# Patient Record
Sex: Female | Born: 1955 | Race: White | Hispanic: No | Smoking: Former smoker
Health system: Southern US, Community
[De-identification: ages and names within clinical notes are randomized; demographics above are authoritative.]

## PROBLEM LIST (undated history)

## (undated) DIAGNOSIS — Z8679 Personal history of other diseases of the circulatory system: Secondary | ICD-10-CM

## (undated) DIAGNOSIS — N159 Renal tubulo-interstitial disease, unspecified: Secondary | ICD-10-CM

## (undated) DIAGNOSIS — K589 Irritable bowel syndrome without diarrhea: Secondary | ICD-10-CM

## (undated) DIAGNOSIS — E785 Hyperlipidemia, unspecified: Secondary | ICD-10-CM

## (undated) DIAGNOSIS — F419 Anxiety disorder, unspecified: Secondary | ICD-10-CM

## (undated) DIAGNOSIS — F988 Other specified behavioral and emotional disorders with onset usually occurring in childhood and adolescence: Secondary | ICD-10-CM

## (undated) DIAGNOSIS — R6882 Decreased libido: Secondary | ICD-10-CM

## (undated) DIAGNOSIS — F32A Depression, unspecified: Secondary | ICD-10-CM

## (undated) DIAGNOSIS — M8580 Other specified disorders of bone density and structure, unspecified site: Secondary | ICD-10-CM

## (undated) DIAGNOSIS — M1990 Unspecified osteoarthritis, unspecified site: Secondary | ICD-10-CM

## (undated) DIAGNOSIS — D329 Benign neoplasm of meninges, unspecified: Secondary | ICD-10-CM

## (undated) DIAGNOSIS — M25569 Pain in unspecified knee: Secondary | ICD-10-CM

## (undated) DIAGNOSIS — E039 Hypothyroidism, unspecified: Secondary | ICD-10-CM

## (undated) DIAGNOSIS — N941 Unspecified dyspareunia: Secondary | ICD-10-CM

## (undated) DIAGNOSIS — Z87440 Personal history of urinary (tract) infections: Secondary | ICD-10-CM

## (undated) DIAGNOSIS — N952 Postmenopausal atrophic vaginitis: Secondary | ICD-10-CM

## (undated) DIAGNOSIS — Z872 Personal history of diseases of the skin and subcutaneous tissue: Secondary | ICD-10-CM

## (undated) DIAGNOSIS — T8859XA Other complications of anesthesia, initial encounter: Secondary | ICD-10-CM

## (undated) DIAGNOSIS — N951 Menopausal and female climacteric states: Secondary | ICD-10-CM

## (undated) DIAGNOSIS — F329 Major depressive disorder, single episode, unspecified: Secondary | ICD-10-CM

## (undated) DIAGNOSIS — R3915 Urgency of urination: Secondary | ICD-10-CM

## (undated) DIAGNOSIS — T7840XA Allergy, unspecified, initial encounter: Secondary | ICD-10-CM

## (undated) DIAGNOSIS — N920 Excessive and frequent menstruation with regular cycle: Secondary | ICD-10-CM

## (undated) DIAGNOSIS — T4145XA Adverse effect of unspecified anesthetic, initial encounter: Secondary | ICD-10-CM

## (undated) DIAGNOSIS — Z9049 Acquired absence of other specified parts of digestive tract: Secondary | ICD-10-CM

## (undated) DIAGNOSIS — N939 Abnormal uterine and vaginal bleeding, unspecified: Secondary | ICD-10-CM

## (undated) DIAGNOSIS — N950 Postmenopausal bleeding: Secondary | ICD-10-CM

## (undated) DIAGNOSIS — N898 Other specified noninflammatory disorders of vagina: Secondary | ICD-10-CM

## (undated) DIAGNOSIS — N200 Calculus of kidney: Secondary | ICD-10-CM

## (undated) HISTORY — DX: Depression, unspecified: F32.A

## (undated) HISTORY — DX: Major depressive disorder, single episode, unspecified: F32.9

## (undated) HISTORY — DX: Other specified behavioral and emotional disorders with onset usually occurring in childhood and adolescence: F98.8

## (undated) HISTORY — DX: Personal history of diseases of the skin and subcutaneous tissue: Z87.2

## (undated) HISTORY — PX: WISDOM TOOTH EXTRACTION: SHX21

## (undated) HISTORY — DX: Excessive and frequent menstruation with regular cycle: N92.0

## (undated) HISTORY — DX: Allergy, unspecified, initial encounter: T78.40XA

## (undated) HISTORY — DX: Decreased libido: R68.82

## (undated) HISTORY — DX: Irritable bowel syndrome, unspecified: K58.9

## (undated) HISTORY — DX: Pain in unspecified knee: M25.569

## (undated) HISTORY — DX: Calculus of kidney: N20.0

## (undated) HISTORY — DX: Postmenopausal bleeding: N95.0

## (undated) HISTORY — DX: Menopausal and female climacteric states: N95.1

## (undated) HISTORY — DX: Personal history of urinary (tract) infections: Z87.440

## (undated) HISTORY — DX: Other specified disorders of bone density and structure, unspecified site: M85.80

## (undated) HISTORY — DX: Abnormal uterine and vaginal bleeding, unspecified: N93.9

## (undated) HISTORY — DX: Urgency of urination: R39.15

## (undated) HISTORY — DX: Other complications of anesthesia, initial encounter: T88.59XA

## (undated) HISTORY — DX: Unspecified osteoarthritis, unspecified site: M19.90

## (undated) HISTORY — DX: Postmenopausal atrophic vaginitis: N95.2

## (undated) HISTORY — DX: Unspecified dyspareunia: N94.10

## (undated) HISTORY — PX: KNEE SURGERY: SHX244

## (undated) HISTORY — DX: Hypothyroidism, unspecified: E03.9

## (undated) HISTORY — DX: Adverse effect of unspecified anesthetic, initial encounter: T41.45XA

## (undated) HISTORY — DX: Benign neoplasm of meninges, unspecified: D32.9

## (undated) HISTORY — PX: TONSILLECTOMY: SUR1361

## (undated) HISTORY — DX: Acquired absence of other specified parts of digestive tract: Z90.49

## (undated) HISTORY — PX: BOWEL RESECTION: SHX1257

## (undated) HISTORY — DX: Renal tubulo-interstitial disease, unspecified: N15.9

## (undated) HISTORY — DX: Hyperlipidemia, unspecified: E78.5

## (undated) HISTORY — DX: Personal history of other diseases of the circulatory system: Z86.79

## (undated) HISTORY — DX: Anxiety disorder, unspecified: F41.9

## (undated) HISTORY — DX: Other specified noninflammatory disorders of vagina: N89.8

## (undated) HISTORY — PX: ANTERIOR CRUCIATE LIGAMENT REPAIR: SHX115

---

## 2001-06-30 ENCOUNTER — Other Ambulatory Visit: Admission: RE | Admit: 2001-06-30 | Discharge: 2001-06-30 | Payer: Self-pay | Admitting: Family Medicine

## 2001-11-10 ENCOUNTER — Encounter: Payer: Self-pay | Admitting: Family Medicine

## 2001-11-10 ENCOUNTER — Encounter: Admission: RE | Admit: 2001-11-10 | Discharge: 2001-11-10 | Payer: Self-pay | Admitting: Family Medicine

## 2001-12-02 ENCOUNTER — Ambulatory Visit (HOSPITAL_COMMUNITY): Admission: RE | Admit: 2001-12-02 | Discharge: 2001-12-02 | Payer: Self-pay | Admitting: Surgery

## 2003-02-18 ENCOUNTER — Other Ambulatory Visit: Admission: RE | Admit: 2003-02-18 | Discharge: 2003-02-18 | Payer: Self-pay | Admitting: Family Medicine

## 2004-04-14 ENCOUNTER — Other Ambulatory Visit: Admission: RE | Admit: 2004-04-14 | Discharge: 2004-04-14 | Payer: Self-pay | Admitting: Family Medicine

## 2004-07-07 ENCOUNTER — Emergency Department (HOSPITAL_COMMUNITY): Admission: EM | Admit: 2004-07-07 | Discharge: 2004-07-07 | Payer: Self-pay | Admitting: Emergency Medicine

## 2004-07-30 DIAGNOSIS — R6882 Decreased libido: Secondary | ICD-10-CM

## 2004-07-30 DIAGNOSIS — N939 Abnormal uterine and vaginal bleeding, unspecified: Secondary | ICD-10-CM

## 2004-07-30 DIAGNOSIS — N951 Menopausal and female climacteric states: Secondary | ICD-10-CM

## 2004-07-30 DIAGNOSIS — N950 Postmenopausal bleeding: Secondary | ICD-10-CM

## 2004-07-30 DIAGNOSIS — N200 Calculus of kidney: Secondary | ICD-10-CM

## 2004-07-30 DIAGNOSIS — N941 Unspecified dyspareunia: Secondary | ICD-10-CM

## 2004-07-30 DIAGNOSIS — N920 Excessive and frequent menstruation with regular cycle: Secondary | ICD-10-CM

## 2004-07-30 HISTORY — DX: Decreased libido: R68.82

## 2004-07-30 HISTORY — DX: Postmenopausal bleeding: N95.0

## 2004-07-30 HISTORY — DX: Unspecified dyspareunia: N94.10

## 2004-07-30 HISTORY — DX: Calculus of kidney: N20.0

## 2004-07-30 HISTORY — DX: Abnormal uterine and vaginal bleeding, unspecified: N93.9

## 2004-07-30 HISTORY — DX: Excessive and frequent menstruation with regular cycle: N92.0

## 2004-07-30 HISTORY — DX: Menopausal and female climacteric states: N95.1

## 2004-08-24 ENCOUNTER — Ambulatory Visit: Payer: Self-pay | Admitting: Family Medicine

## 2005-02-15 ENCOUNTER — Ambulatory Visit: Payer: Self-pay | Admitting: Internal Medicine

## 2005-03-06 ENCOUNTER — Ambulatory Visit: Payer: Self-pay

## 2005-03-19 ENCOUNTER — Ambulatory Visit: Payer: Self-pay | Admitting: Internal Medicine

## 2005-06-18 ENCOUNTER — Ambulatory Visit: Payer: Self-pay | Admitting: Internal Medicine

## 2005-07-30 DIAGNOSIS — N952 Postmenopausal atrophic vaginitis: Secondary | ICD-10-CM

## 2005-07-30 DIAGNOSIS — N898 Other specified noninflammatory disorders of vagina: Secondary | ICD-10-CM

## 2005-07-30 HISTORY — DX: Other specified noninflammatory disorders of vagina: N89.8

## 2005-07-30 HISTORY — DX: Postmenopausal atrophic vaginitis: N95.2

## 2005-09-10 ENCOUNTER — Ambulatory Visit: Payer: Self-pay | Admitting: Internal Medicine

## 2005-11-13 ENCOUNTER — Ambulatory Visit: Payer: Self-pay | Admitting: Internal Medicine

## 2005-11-22 ENCOUNTER — Ambulatory Visit: Payer: Self-pay | Admitting: Internal Medicine

## 2005-12-17 ENCOUNTER — Ambulatory Visit: Payer: Self-pay | Admitting: Internal Medicine

## 2006-01-30 ENCOUNTER — Emergency Department (HOSPITAL_COMMUNITY): Admission: EM | Admit: 2006-01-30 | Discharge: 2006-01-30 | Payer: Self-pay | Admitting: Emergency Medicine

## 2006-03-19 ENCOUNTER — Ambulatory Visit: Payer: Self-pay | Admitting: Internal Medicine

## 2006-04-24 ENCOUNTER — Other Ambulatory Visit: Admission: RE | Admit: 2006-04-24 | Discharge: 2006-04-24 | Payer: Self-pay | Admitting: Obstetrics and Gynecology

## 2006-07-10 ENCOUNTER — Ambulatory Visit: Payer: Self-pay | Admitting: Internal Medicine

## 2006-07-10 LAB — CONVERTED CEMR LAB
ALT: 18 units/L (ref 0–40)
AST: 26 units/L (ref 0–37)
Albumin: 3.9 g/dL (ref 3.5–5.2)
BUN: 13 mg/dL (ref 6–23)
Basophils Relative: 0.5 % (ref 0.0–1.0)
Bilirubin Urine: NEGATIVE
Creatinine, Ser: 0.7 mg/dL (ref 0.4–1.2)
Eosinophil percent: 2.8 % (ref 0.0–5.0)
Free T4: 0.9 ng/dL (ref 0.9–1.8)
GFR calc non Af Amer: 94 mL/min
HCT: 38 % (ref 36.0–46.0)
HDL: 90.5 mg/dL (ref >39.0)
Hemoglobin: 12.7 g/dL (ref 12.0–15.0)
Hgb A1c MFr Bld: 5.8 % (ref 4.6–6.0)
LDL DIRECT: 224.4 mg/dL
Leukocytes, UA: NEGATIVE
MCHC: 33.4 g/dL (ref 30.0–36.0)
Neutro Abs: 1.8 10*3/uL (ref 1.4–7.7)
Neutrophils Relative %: 37.2 % — ABNORMAL LOW (ref 43.0–77.0)
Potassium: 4.1 meq/L (ref 3.5–5.1)
RBC: 4.2 M/uL (ref 3.87–5.11)
RDW: 13.9 % (ref 11.5–14.6)
Sodium: 139 meq/L (ref 135–145)
Total Bilirubin: 1.1 mg/dL (ref 0.3–1.2)
Triglyceride fasting, serum: 58 mg/dL (ref 0–149)
Urine Glucose: NEGATIVE mg/dL
WBC: 4.9 10*3/uL (ref 4.5–10.5)

## 2006-07-17 ENCOUNTER — Ambulatory Visit: Payer: Self-pay | Admitting: Internal Medicine

## 2006-10-28 ENCOUNTER — Ambulatory Visit: Payer: Self-pay | Admitting: Internal Medicine

## 2006-10-28 LAB — CONVERTED CEMR LAB
Cholesterol: 298 mg/dL (ref 0–200)
HDL: 80.9 mg/dL (ref >39.0)
VLDL: 14 mg/dL (ref 0–40)

## 2006-10-30 ENCOUNTER — Ambulatory Visit: Payer: Self-pay | Admitting: Internal Medicine

## 2006-11-26 ENCOUNTER — Ambulatory Visit: Payer: Self-pay | Admitting: Internal Medicine

## 2006-12-27 ENCOUNTER — Ambulatory Visit: Payer: Self-pay | Admitting: Internal Medicine

## 2007-02-07 ENCOUNTER — Ambulatory Visit: Payer: Self-pay | Admitting: Internal Medicine

## 2007-02-27 DIAGNOSIS — K589 Irritable bowel syndrome without diarrhea: Secondary | ICD-10-CM | POA: Insufficient documentation

## 2007-02-27 DIAGNOSIS — E559 Vitamin D deficiency, unspecified: Secondary | ICD-10-CM | POA: Insufficient documentation

## 2007-02-27 DIAGNOSIS — E785 Hyperlipidemia, unspecified: Secondary | ICD-10-CM | POA: Insufficient documentation

## 2007-04-10 ENCOUNTER — Ambulatory Visit: Payer: Self-pay | Admitting: Internal Medicine

## 2007-04-10 LAB — CONVERTED CEMR LAB
ALT: 20 units/L (ref 0–35)
AST: 20 units/L (ref 0–37)
Alkaline Phosphatase: 37 units/L — ABNORMAL LOW (ref 39–117)
BUN: 11 mg/dL (ref 6–23)
Basophils Relative: 0.4 % (ref 0.0–1.0)
CO2: 29 meq/L (ref 19–32)
Calcium: 9.5 mg/dL (ref 8.4–10.5)
Chloride: 106 meq/L (ref 96–112)
Cholesterol: 279 mg/dL (ref 0–200)
Eosinophils Relative: 1.7 % (ref 0.0–5.0)
Free T4: 0.8 ng/dL (ref 0.6–1.6)
GFR calc Af Amer: 85 mL/min
GFR calc non Af Amer: 70 mL/min
HDL: 90.2 mg/dL (ref >39.0)
Lymphocytes Relative: 45.8 % (ref 12.0–46.0)
Monocytes Relative: 7.9 % (ref 3.0–11.0)
Neutro Abs: 2.7 10*3/uL (ref 1.4–7.7)
Platelets: 325 10*3/uL (ref 150–400)
RBC: 4.47 M/uL (ref 3.87–5.11)
T3 Uptake Ratio: 43.7 % — ABNORMAL HIGH (ref 22.5–37.0)
TSH: 1.45 microintl units/mL (ref 0.35–5.50)
Total CHOL/HDL Ratio: 3.1
Total Protein: 6.7 g/dL (ref 6.0–8.3)
Triglycerides: 49 mg/dL (ref 0–149)
VLDL: 10 mg/dL (ref 0–40)
Vit D, 1,25-Dihydroxy: 23 (ref 20–57)
WBC: 6.1 10*3/uL (ref 4.5–10.5)

## 2007-04-11 ENCOUNTER — Ambulatory Visit: Payer: Self-pay | Admitting: Internal Medicine

## 2007-04-24 ENCOUNTER — Ambulatory Visit: Payer: Self-pay | Admitting: Internal Medicine

## 2007-04-30 ENCOUNTER — Ambulatory Visit: Payer: Self-pay | Admitting: Internal Medicine

## 2007-05-29 ENCOUNTER — Telehealth: Payer: Self-pay | Admitting: Internal Medicine

## 2007-06-24 ENCOUNTER — Telehealth: Payer: Self-pay | Admitting: Internal Medicine

## 2007-06-27 ENCOUNTER — Emergency Department (HOSPITAL_COMMUNITY): Admission: EM | Admit: 2007-06-27 | Discharge: 2007-06-27 | Payer: Self-pay | Admitting: Emergency Medicine

## 2007-07-22 ENCOUNTER — Ambulatory Visit: Payer: Self-pay | Admitting: Internal Medicine

## 2007-07-22 DIAGNOSIS — R42 Dizziness and giddiness: Secondary | ICD-10-CM | POA: Insufficient documentation

## 2007-07-22 DIAGNOSIS — M949 Disorder of cartilage, unspecified: Secondary | ICD-10-CM

## 2007-07-22 DIAGNOSIS — E039 Hypothyroidism, unspecified: Secondary | ICD-10-CM | POA: Insufficient documentation

## 2007-07-22 DIAGNOSIS — M1990 Unspecified osteoarthritis, unspecified site: Secondary | ICD-10-CM | POA: Insufficient documentation

## 2007-07-22 DIAGNOSIS — M899 Disorder of bone, unspecified: Secondary | ICD-10-CM | POA: Insufficient documentation

## 2007-07-31 DIAGNOSIS — R3915 Urgency of urination: Secondary | ICD-10-CM

## 2007-07-31 DIAGNOSIS — Z87440 Personal history of urinary (tract) infections: Secondary | ICD-10-CM

## 2007-07-31 HISTORY — DX: Urgency of urination: R39.15

## 2007-07-31 HISTORY — DX: Personal history of urinary (tract) infections: Z87.440

## 2007-10-21 ENCOUNTER — Encounter: Payer: Self-pay | Admitting: Internal Medicine

## 2007-11-17 ENCOUNTER — Ambulatory Visit: Payer: Self-pay | Admitting: Internal Medicine

## 2007-11-17 ENCOUNTER — Encounter (INDEPENDENT_AMBULATORY_CARE_PROVIDER_SITE_OTHER): Payer: Self-pay | Admitting: *Deleted

## 2007-11-17 DIAGNOSIS — B079 Viral wart, unspecified: Secondary | ICD-10-CM | POA: Insufficient documentation

## 2007-11-17 DIAGNOSIS — F988 Other specified behavioral and emotional disorders with onset usually occurring in childhood and adolescence: Secondary | ICD-10-CM | POA: Insufficient documentation

## 2007-11-17 DIAGNOSIS — M546 Pain in thoracic spine: Secondary | ICD-10-CM | POA: Insufficient documentation

## 2007-12-03 ENCOUNTER — Ambulatory Visit: Payer: Self-pay | Admitting: Internal Medicine

## 2007-12-03 DIAGNOSIS — H5450 Low vision, one eye, unspecified eye: Secondary | ICD-10-CM | POA: Insufficient documentation

## 2007-12-05 ENCOUNTER — Ambulatory Visit: Payer: Self-pay

## 2007-12-05 ENCOUNTER — Encounter: Payer: Self-pay | Admitting: Internal Medicine

## 2007-12-19 ENCOUNTER — Encounter: Payer: Self-pay | Admitting: Internal Medicine

## 2007-12-30 ENCOUNTER — Telehealth: Payer: Self-pay | Admitting: Internal Medicine

## 2008-01-05 ENCOUNTER — Telehealth: Payer: Self-pay | Admitting: Internal Medicine

## 2008-01-16 ENCOUNTER — Ambulatory Visit: Payer: Self-pay | Admitting: Internal Medicine

## 2008-01-22 ENCOUNTER — Telehealth (INDEPENDENT_AMBULATORY_CARE_PROVIDER_SITE_OTHER): Payer: Self-pay | Admitting: *Deleted

## 2008-01-22 ENCOUNTER — Telehealth: Payer: Self-pay | Admitting: Internal Medicine

## 2008-02-02 ENCOUNTER — Telehealth: Payer: Self-pay | Admitting: Internal Medicine

## 2008-02-03 ENCOUNTER — Encounter: Payer: Self-pay | Admitting: Internal Medicine

## 2008-02-06 ENCOUNTER — Encounter: Payer: Self-pay | Admitting: Internal Medicine

## 2008-02-06 LAB — CONVERTED CEMR LAB
Alkaline Phosphatase: 38 units/L — ABNORMAL LOW (ref 39–117)
Basophils Absolute: 0.1 10*3/uL (ref 0.0–0.1)
Bilirubin Urine: NEGATIVE
Bilirubin, Direct: 0.1 mg/dL (ref 0.0–0.3)
CO2: 31 meq/L (ref 19–32)
Calcium: 9.4 mg/dL (ref 8.4–10.5)
Cholesterol: 265 mg/dL (ref 0–200)
Crystals: NEGATIVE
Free T4: 1 ng/dL (ref 0.6–1.6)
GFR calc Af Amer: 97 mL/min
Glucose, Bld: 91 mg/dL (ref 70–99)
Hemoglobin, Urine: NEGATIVE
Lymphocytes Relative: 31.1 % (ref 12.0–46.0)
MCHC: 33.4 g/dL (ref 30.0–36.0)
Monocytes Relative: 7 % (ref 3.0–12.0)
Nitrite: NEGATIVE
Platelets: 365 10*3/uL (ref 150–400)
Potassium: 4 meq/L (ref 3.5–5.1)
RDW: 13.9 % (ref 11.5–14.6)
Sodium: 141 meq/L (ref 135–145)
TSH: 1.02 microintl units/mL (ref 0.35–5.50)
Total Bilirubin: 0.9 mg/dL (ref 0.3–1.2)
Total Protein: 5.9 g/dL — ABNORMAL LOW (ref 6.0–8.3)
Urobilinogen, UA: 0.2 (ref 0.0–1.0)

## 2008-02-17 ENCOUNTER — Ambulatory Visit (HOSPITAL_COMMUNITY): Admission: RE | Admit: 2008-02-17 | Discharge: 2008-02-17 | Payer: Self-pay | Admitting: Neurology

## 2008-02-17 ENCOUNTER — Encounter: Payer: Self-pay | Admitting: Internal Medicine

## 2008-02-19 ENCOUNTER — Telehealth: Payer: Self-pay | Admitting: Internal Medicine

## 2008-02-20 ENCOUNTER — Encounter: Admission: RE | Admit: 2008-02-20 | Discharge: 2008-02-20 | Payer: Self-pay | Admitting: Neurology

## 2008-02-25 ENCOUNTER — Ambulatory Visit: Payer: Self-pay | Admitting: Internal Medicine

## 2008-02-25 DIAGNOSIS — E249 Cushing's syndrome, unspecified: Secondary | ICD-10-CM | POA: Insufficient documentation

## 2008-02-25 DIAGNOSIS — H469 Unspecified optic neuritis: Secondary | ICD-10-CM | POA: Insufficient documentation

## 2008-03-02 ENCOUNTER — Encounter: Payer: Self-pay | Admitting: Internal Medicine

## 2008-03-10 ENCOUNTER — Encounter: Payer: Self-pay | Admitting: Internal Medicine

## 2008-03-25 ENCOUNTER — Encounter: Payer: Self-pay | Admitting: Internal Medicine

## 2008-04-19 ENCOUNTER — Ambulatory Visit: Payer: Self-pay | Admitting: Internal Medicine

## 2008-04-19 LAB — CONVERTED CEMR LAB
ALT: 21 units/L (ref 0–35)
AST: 22 units/L (ref 0–37)
BUN: 20 mg/dL (ref 6–23)
Basophils Relative: 0.3 % (ref 0.0–3.0)
CO2: 32 meq/L (ref 19–32)
Calcium: 9.7 mg/dL (ref 8.4–10.5)
Chloride: 106 meq/L (ref 96–112)
Creatinine, Ser: 0.9 mg/dL (ref 0.4–1.2)
Eosinophils Relative: 1.5 % (ref 0.0–5.0)
Glucose, Bld: 97 mg/dL (ref 70–99)
Hemoglobin: 14.5 g/dL (ref 12.0–15.0)
Lymphocytes Relative: 44.3 % (ref 12.0–46.0)
MCHC: 34 g/dL (ref 30.0–36.0)
Monocytes Relative: 5.7 % (ref 3.0–12.0)
Neutro Abs: 3.5 10*3/uL (ref 1.4–7.7)
Neutrophils Relative %: 48.2 % (ref 43.0–77.0)
RBC: 4.54 M/uL (ref 3.87–5.11)
TSH: 2.36 microintl units/mL (ref 0.35–5.50)
Total Bilirubin: 0.9 mg/dL (ref 0.3–1.2)
Total Protein: 6.7 g/dL (ref 6.0–8.3)
Vitamin B-12: 1260 pg/mL — ABNORMAL HIGH (ref 211–911)
WBC: 7.1 10*3/uL (ref 4.5–10.5)

## 2008-04-20 ENCOUNTER — Encounter: Payer: Self-pay | Admitting: Internal Medicine

## 2008-04-30 ENCOUNTER — Encounter: Payer: Self-pay | Admitting: Internal Medicine

## 2008-05-06 ENCOUNTER — Encounter: Payer: Self-pay | Admitting: Internal Medicine

## 2008-05-17 ENCOUNTER — Telehealth: Payer: Self-pay | Admitting: Internal Medicine

## 2008-05-17 ENCOUNTER — Encounter: Payer: Self-pay | Admitting: Internal Medicine

## 2008-06-15 ENCOUNTER — Encounter: Payer: Self-pay | Admitting: Internal Medicine

## 2008-06-21 ENCOUNTER — Encounter: Payer: Self-pay | Admitting: Internal Medicine

## 2008-06-21 ENCOUNTER — Encounter: Admission: RE | Admit: 2008-06-21 | Discharge: 2008-06-21 | Payer: Self-pay | Admitting: Neurology

## 2008-07-19 ENCOUNTER — Ambulatory Visit: Payer: Self-pay | Admitting: Internal Medicine

## 2008-07-30 DIAGNOSIS — Z872 Personal history of diseases of the skin and subcutaneous tissue: Secondary | ICD-10-CM

## 2008-07-30 HISTORY — DX: Personal history of diseases of the skin and subcutaneous tissue: Z87.2

## 2008-08-04 ENCOUNTER — Telehealth: Payer: Self-pay | Admitting: Internal Medicine

## 2008-09-22 ENCOUNTER — Ambulatory Visit: Payer: Self-pay | Admitting: Internal Medicine

## 2008-10-25 ENCOUNTER — Encounter: Payer: Self-pay | Admitting: Internal Medicine

## 2008-12-15 ENCOUNTER — Ambulatory Visit (HOSPITAL_COMMUNITY): Admission: RE | Admit: 2008-12-15 | Discharge: 2008-12-15 | Payer: Self-pay | Admitting: Neurology

## 2008-12-17 ENCOUNTER — Encounter: Admission: RE | Admit: 2008-12-17 | Discharge: 2008-12-17 | Payer: Self-pay | Admitting: Neurology

## 2008-12-29 ENCOUNTER — Ambulatory Visit: Payer: Self-pay | Admitting: Internal Medicine

## 2008-12-29 DIAGNOSIS — M542 Cervicalgia: Secondary | ICD-10-CM | POA: Insufficient documentation

## 2008-12-29 LAB — CONVERTED CEMR LAB: Vit D, 25-Hydroxy: 44 ng/mL (ref 30–89)

## 2008-12-31 ENCOUNTER — Encounter: Payer: Self-pay | Admitting: Internal Medicine

## 2008-12-31 LAB — CONVERTED CEMR LAB
Albumin: 4.2 g/dL (ref 3.5–5.2)
BUN: 15 mg/dL (ref 6–23)
Basophils Relative: 0.7 % (ref 0.0–3.0)
Calcium: 9.7 mg/dL (ref 8.4–10.5)
Cortisol, Plasma: 6 ug/dL
Eosinophils Absolute: 0.1 10*3/uL (ref 0.0–0.7)
Eosinophils Relative: 2.6 % (ref 0.0–5.0)
GFR calc non Af Amer: 79.87 mL/min (ref >60)
Glucose, Bld: 93 mg/dL (ref 70–99)
HCT: 42.1 % (ref 36.0–46.0)
Lymphs Abs: 2.3 10*3/uL (ref 0.7–4.0)
MCHC: 34 g/dL (ref 30.0–36.0)
MCV: 91.8 fL (ref 78.0–100.0)
Monocytes Absolute: 0.5 10*3/uL (ref 0.1–1.0)
Platelets: 301 10*3/uL (ref 150.0–400.0)
Sodium: 143 meq/L (ref 135–145)
Total Bilirubin: 1 mg/dL (ref 0.3–1.2)
Vitamin B-12: 871 pg/mL (ref 211–911)
WBC: 4.5 10*3/uL (ref 4.5–10.5)

## 2009-03-15 ENCOUNTER — Ambulatory Visit: Payer: Self-pay | Admitting: Internal Medicine

## 2009-03-15 ENCOUNTER — Telehealth: Payer: Self-pay | Admitting: Internal Medicine

## 2009-03-16 LAB — CONVERTED CEMR LAB
Bilirubin Urine: NEGATIVE
Hemoglobin, Urine: NEGATIVE
Ketones, ur: NEGATIVE mg/dL
Leukocytes, UA: NEGATIVE
Urobilinogen, UA: 0.2 (ref 0.0–1.0)

## 2009-05-03 ENCOUNTER — Ambulatory Visit: Payer: Self-pay | Admitting: Internal Medicine

## 2009-05-05 LAB — CONVERTED CEMR LAB
AST: 21 units/L (ref 0–37)
BUN: 12 mg/dL (ref 6–23)
Basophils Absolute: 0.1 10*3/uL (ref 0.0–0.1)
Bilirubin Urine: NEGATIVE
Calcium: 9.6 mg/dL (ref 8.4–10.5)
Cholesterol: 291 mg/dL — ABNORMAL HIGH (ref 0–200)
Creatinine, Ser: 0.8 mg/dL (ref 0.4–1.2)
GFR calc non Af Amer: 79.76 mL/min (ref >60)
Glucose, Bld: 97 mg/dL (ref 70–99)
HCT: 42.2 % (ref 36.0–46.0)
Hemoglobin, Urine: NEGATIVE
Lymphs Abs: 2.6 10*3/uL (ref 0.7–4.0)
Monocytes Relative: 8.8 % (ref 3.0–12.0)
Platelets: 302 10*3/uL (ref 150.0–400.0)
RDW: 12.2 % (ref 11.5–14.6)
Total Bilirubin: 0.8 mg/dL (ref 0.3–1.2)
Total CHOL/HDL Ratio: 3
Total Protein, Urine: NEGATIVE mg/dL
Triglycerides: 74 mg/dL (ref 0.0–149.0)
Urine Glucose: NEGATIVE mg/dL
VLDL: 14.8 mg/dL (ref 0.0–40.0)

## 2009-05-06 ENCOUNTER — Telehealth: Payer: Self-pay | Admitting: Internal Medicine

## 2009-05-10 ENCOUNTER — Telehealth (INDEPENDENT_AMBULATORY_CARE_PROVIDER_SITE_OTHER): Payer: Self-pay | Admitting: *Deleted

## 2009-06-03 ENCOUNTER — Telehealth: Payer: Self-pay | Admitting: Internal Medicine

## 2009-06-10 ENCOUNTER — Encounter: Payer: Self-pay | Admitting: Internal Medicine

## 2009-07-06 ENCOUNTER — Ambulatory Visit: Payer: Self-pay | Admitting: Internal Medicine

## 2009-07-06 ENCOUNTER — Encounter: Admission: RE | Admit: 2009-07-06 | Discharge: 2009-07-06 | Payer: Self-pay | Admitting: Neurology

## 2009-07-30 DIAGNOSIS — D329 Benign neoplasm of meninges, unspecified: Secondary | ICD-10-CM

## 2009-07-30 HISTORY — DX: Benign neoplasm of meninges, unspecified: D32.9

## 2009-09-07 ENCOUNTER — Telehealth: Payer: Self-pay | Admitting: Internal Medicine

## 2009-09-29 ENCOUNTER — Telehealth: Payer: Self-pay | Admitting: Internal Medicine

## 2009-09-30 ENCOUNTER — Ambulatory Visit: Payer: Self-pay | Admitting: Gastroenterology

## 2009-09-30 DIAGNOSIS — K219 Gastro-esophageal reflux disease without esophagitis: Secondary | ICD-10-CM | POA: Insufficient documentation

## 2009-10-10 ENCOUNTER — Ambulatory Visit: Payer: Self-pay | Admitting: Gastroenterology

## 2009-10-10 DIAGNOSIS — L089 Local infection of the skin and subcutaneous tissue, unspecified: Secondary | ICD-10-CM | POA: Insufficient documentation

## 2009-11-02 ENCOUNTER — Ambulatory Visit: Payer: Self-pay | Admitting: Internal Medicine

## 2009-11-02 LAB — CONVERTED CEMR LAB
ALT: 23 units/L (ref 0–35)
AST: 19 units/L (ref 0–37)
Albumin: 4.3 g/dL (ref 3.5–5.2)
BUN: 12 mg/dL (ref 6–23)
Basophils Relative: 0.9 % (ref 0.0–3.0)
CO2: 31 meq/L (ref 19–32)
Chloride: 104 meq/L (ref 96–112)
Cholesterol: 326 mg/dL — ABNORMAL HIGH (ref 0–200)
Eosinophils Absolute: 0.2 10*3/uL (ref 0.0–0.7)
Eosinophils Relative: 3 % (ref 0.0–5.0)
Glucose, Bld: 92 mg/dL (ref 70–99)
Hemoglobin: 13.8 g/dL (ref 12.0–15.0)
Lymphocytes Relative: 48.3 % — ABNORMAL HIGH (ref 12.0–46.0)
MCHC: 34.8 g/dL (ref 30.0–36.0)
MCV: 90.5 fL (ref 78.0–100.0)
Monocytes Absolute: 0.5 10*3/uL (ref 0.1–1.0)
Neutro Abs: 2.3 10*3/uL (ref 1.4–7.7)
Potassium: 4.1 meq/L (ref 3.5–5.1)
RBC: 4.38 M/uL (ref 3.87–5.11)

## 2009-11-04 ENCOUNTER — Ambulatory Visit: Payer: Self-pay | Admitting: Internal Medicine

## 2009-11-04 DIAGNOSIS — E669 Obesity, unspecified: Secondary | ICD-10-CM | POA: Insufficient documentation

## 2009-11-07 DIAGNOSIS — F4321 Adjustment disorder with depressed mood: Secondary | ICD-10-CM | POA: Insufficient documentation

## 2009-11-07 DIAGNOSIS — F329 Major depressive disorder, single episode, unspecified: Secondary | ICD-10-CM

## 2009-12-06 ENCOUNTER — Encounter: Payer: Self-pay | Admitting: Internal Medicine

## 2010-03-10 ENCOUNTER — Ambulatory Visit: Payer: Self-pay | Admitting: Internal Medicine

## 2010-03-10 DIAGNOSIS — R011 Cardiac murmur, unspecified: Secondary | ICD-10-CM | POA: Insufficient documentation

## 2010-03-24 ENCOUNTER — Encounter: Payer: Self-pay | Admitting: Internal Medicine

## 2010-04-06 ENCOUNTER — Encounter: Payer: Self-pay | Admitting: Internal Medicine

## 2010-04-13 ENCOUNTER — Telehealth (INDEPENDENT_AMBULATORY_CARE_PROVIDER_SITE_OTHER): Payer: Self-pay | Admitting: *Deleted

## 2010-04-14 ENCOUNTER — Ambulatory Visit: Payer: Self-pay | Admitting: Internal Medicine

## 2010-04-14 DIAGNOSIS — M79609 Pain in unspecified limb: Secondary | ICD-10-CM | POA: Insufficient documentation

## 2010-04-17 ENCOUNTER — Encounter: Payer: Self-pay | Admitting: Internal Medicine

## 2010-06-01 ENCOUNTER — Encounter: Payer: Self-pay | Admitting: Internal Medicine

## 2010-07-14 ENCOUNTER — Ambulatory Visit: Payer: Self-pay | Admitting: Internal Medicine

## 2010-07-14 DIAGNOSIS — D485 Neoplasm of uncertain behavior of skin: Secondary | ICD-10-CM | POA: Insufficient documentation

## 2010-07-14 DIAGNOSIS — F418 Other specified anxiety disorders: Secondary | ICD-10-CM | POA: Insufficient documentation

## 2010-07-17 ENCOUNTER — Ambulatory Visit: Payer: Self-pay | Admitting: Internal Medicine

## 2010-08-16 ENCOUNTER — Ambulatory Visit: Admit: 2010-08-16 | Payer: Self-pay | Admitting: Internal Medicine

## 2010-08-20 ENCOUNTER — Encounter: Payer: Self-pay | Admitting: Neurology

## 2010-08-29 NOTE — Letter (Addendum)
Summary: Neurosurgery Consult/[facility]  Neurosurgery/[facility]   Imported By: Sherian Rein 06/15/2010 10:56:18  _____________________________________________________________________  External Attachment:    Type:   Image     Comment:   External Document

## 2010-08-29 NOTE — Assessment & Plan Note (Signed)
Summary: 4 MO ROV /NWS   Vital Signs:  Patient profile:   55 year old female Weight:      167 pounds BMI:     29.69 Temp:     98.3 degrees F Pulse rate:   60 / minute BP sitting:   132 / 68  (left arm) Cuff size:   regular  Vitals Entered By: Lamar Sprinkles, CMA (March 10, 2010 3:35 PM) CC: Discuss mitral valve prolapse. GYN heard "click" on exam, pt has previous dx of MVP.  Comments Does NOT take Welbutrin, please removed from list   Primary Care Provider:  Sula Soda, MD   CC:  Discuss mitral valve prolapse. GYN heard "click" on exam and pt has previous dx of MVP. Marland Kitchen  History of Present Illness: The patient presents for a follow up of thoracic  back pain, anxiety, depression and and optic neuritis.  On a diet - lost wt F/u chol issue Her GYN told her that she had a heart click  Allergies: 1)  ! Darvocet A500 2)  ! * Latex 3)  Epinephrine 4)  Prometrium (Progesterone Micronized) 5)  Strattera (Atomoxetine Hcl) 6)  Vyvanse (Lisdexamfetamine Dimesylate)  Past History:  Past Medical History: Last updated: 11/04/2009 Hyperlipidemia Osteoarthritis Osteopenia IBS VitD def Hypothyroidism Poss. ADD L knee pain s/p fx Optic neuritis L 2009 Depression  Past Surgical History: Last updated: 11/17/2007 Knee surg L for fx  Family History: Last updated: 09/30/2009 Family History High cholesterol M had a brain tumor No FH of Colon Cancer:  Social History: Last updated: 07/22/2007 Occupation: not working Married Never Smoked  Review of Systems  The patient denies weight gain, chest pain, syncope, dyspnea on exertion, abdominal pain, and melena.    Physical Exam  General:  Less overweight-appearing.  NAD Eyes:  pupils reactive to light, and no injection.  Vision out of L eye is markedly decreased Mouth:  Oral mucosa and oropharynx without lesions or exudates.  Teeth in good repair. Lungs:  Normal respiratory effort, chest expands symmetrically. Lungs are  clear to auscultation, no crackles or wheezes. Heart:  1/6 systolic heart murmur  Abdomen:  Bowel sounds positive,abdomen soft and non-tender without masses, organomegaly or hernias noted. Msk:  No deformity or scoliosis noted of thoracic or lumbar spine.  L knee w/pain. Cervical  and thoracic spine is tender to palpation over paraspinal muscles and with the ROM  Extremities:  No clubbing, cyanosis, edema, or deformity noted with normal full range of motion of all joints.   Neurologic:  alert & oriented X3.   Skin:  Intact without suspicious lesions or rashes Psych:  Oriented X3 and memory intact for recent and remote. Not  tearful.  She is depressed and worried, not suicidal.     Impression & Recommendations:  Problem # 1:  DEPRESSION (ICD-311) Assessment Improved  Her updated medication list for this problem includes:    Wellbutrin Sr 100 Mg Xr12h-tab (Bupropion hcl) .Marland Kitchen... 1 by mouth two times a day: am and lunch  Problem # 2:  HYPOTHYROIDISM (ICD-244.9) Assessment: Unchanged On the regimen of medicine(s) reflected in the chart    Problem # 3:  ADD (ICD-314.00) Assessment: Unchanged Off Rx  Problem # 4:  OBESITY (ICD-278.00) Assessment: Improved On diet  Problem # 5:  HYPERLIPIDEMIA (ICD-272.4) Assessment: Comment Only Labs ordered  Problem # 6:  OPTIC NEURITIS (ICD-377.30) Assessment: Unchanged  Problem # 7:  HEART MURMUR, SYSTOLIC (ICD-785.2) Assessment: New  Orders: Echo Referral (Echo)  Complete Medication  List: 1)  Fish Oil 1000 Mg Caps (Omega-3 fatty acids) .... 2 once daily 2)  Cvs Vit D 5000 High-potency 5000 Unit Caps (Cholecalciferol) .... Once daily 3)  Multi-vitamin Tabs (Multiple vitamin) .... Once daily 4)  Wellbutrin Sr 100 Mg Xr12h-tab (Bupropion hcl) .Marland Kitchen.. 1 by mouth two times a day: am and lunch 5)  Bio-est Hormone Cream   Patient Instructions: 1)  Labs  next wk - 2)  BMP prior to visit, ICD-9: 3)  Hepatic Panel prior to visit, ICD-9: 4)  Lipid  Panel prior to visit, ICD-9: 5)  TSH prior to visit, ICD-9: 6)  CBC w/ Diff prior to visit, ICD-9: 7)  Urine-dip prior to visit, ICD-9: 8)  Vit B12  272.0 995.20 9)  Please schedule a follow-up appointment in 4 months.   Immunization History:  Tetanus/Td Immunization History:    Tetanus/Td:  historical (12/29/2002)

## 2010-08-29 NOTE — Progress Notes (Signed)
Summary: triage   Phone Note Call from Patient Call back at Home Phone [phone]   Caller: Patient Call For: Dr. Juanda Chance Reason for Call: Talk to Nurse Summary of Call: pt has very tender cyst/bump near rectum for about a week... pain and tenderness has increased as well as the the size- getting bigger... pt would like to get to get in before the weekend Initial call taken by: Vallarie Mare,  September 29, 2009 11:42 AM  Follow-up for Phone Call        Left message for patient to call back Darcey Nora RN, [facility]  September 29, 2009 1:39 PM\   Patient  c/o bump near rectum, is pretty sure it is not a hemorrhoid because it is not in the rectum it is off to the side.  Patient  describes as very tender adn getting worse.  Patient  will come in and see Willette Cluster RNP 09-30-09 3:00 Follow-up by: Darcey Nora RN, CGRN,  September 29, 2009 3:58 PM

## 2010-08-29 NOTE — Assessment & Plan Note (Signed)
Summary: mass near rectum/Jennifer [name]    History of Present Illness Visit Type: Follow-up Visit Primary GI MD: Stan Head MD [facility] Primary Provider: Sula Soda, MD  Requesting Provider: n/a Chief Complaint: Mass near rectum  History of Present Illness:   55  YO  FEMALE KNOWN TO DR. Juanda Chance FROM COLONOSCOPY IN 2008 WHICH WAS NORMAL. SHE COMES IN TODAY WITH C/O A HARD ,TENDER AREA ON HER LEFT BUTTOCK WHICH HAS BEE PRESENT FOR ABOUT A WEEK,BUT HAS GOTTEN MORE UNCOMFORTABLE OVER THE PAST COUPLE DAYS. NO FEVER,CHILLS, NO ABDOMINAL PAIN,NO RECTAL PAIN, BLEEDING OR DRAINAGE. SHE SAYS SHE HAD A SIMILAR BOIL TYPE OF INFECTION SEVERAL YEARS AGO WHICH WAS TREATED BY DR PLOTNIKOV  WITH ABX AND RESOLVED.  SHE MENTIONS CHRONIC ISSUES WITH IBS,AND HAS RECENTLY HAD SOME HEARTBURN,INTERMITTENTLY.   GI Review of Systems    Reports acid reflux and  bloating.      Denies abdominal pain, belching, chest pain, dysphagia with liquids, dysphagia with solids, heartburn, loss of appetite, nausea, vomiting, vomiting blood, and  weight loss.      Reports irritable bowel syndrome and  rectal pain.     Denies anal fissure, black tarry stools, change in bowel habit, constipation, diarrhea, diverticulosis, fecal incontinence, heme positive stool, hemorrhoids, jaundice, light color stool, liver problems, and  rectal bleeding.    Current Medications (verified): 1)  Fish Oil 1000 Mg  Caps (Omega-3 Fatty Acids) .... 2 Once Daily 2)  Cvs Vit D 5000 High-Potency 5000 Unit Caps (Cholecalciferol) .... Once Daily 3)  Multi-Vitamin  Tabs (Multiple Vitamin) .... Once Daily 4)  Bio-Est Hormone Cream 5)  Lorazepam 0.5 Mg Tabs (Lorazepam) .Marland Kitchen.. 1-2 By Mouth Two Times A Day As Needed For Anxiety 6)  Vyvanse 20 Mg Caps (Lisdexamfetamine Dimesylate) .Marland Kitchen.. 1 By Mouth Q Am  Allergies (verified): 1)  ! Darvocet A500 2)  ! * Latex 3)  Epinephrine 4)  Prometrium (Progesterone Micronized) 5)  Strattera (Atomoxetine Hcl)  Past  History:  Past Medical History: Reviewed history from 02/25/2008 and no changes required. Hyperlipidemia Osteoarthritis Osteopenia IBS VitD def Hypothyroidism Poss. ADD L knee pain s/p fx Optic neuritis L 2009  Past Surgical History: Reviewed history from 11/17/2007 and no changes required. Knee surg L for fx  Family History: Family History High cholesterol M had a brain tumor No FH of Colon Cancer:  Social History: Reviewed history from 07/22/2007 and no changes required. Occupation: not working Married Never Smoked  Review of Systems  The patient denies allergy/sinus, anemia, anxiety-new, arthritis/joint pain, back pain, blood in urine, breast changes/lumps, change in vision, confusion, cough, coughing up blood, depression-new, fainting, fatigue, fever, headaches-new, hearing problems, heart murmur, heart rhythm changes, itching, menstrual pain, muscle pains/cramps, night sweats, nosebleeds, pregnancy symptoms, shortness of breath, skin rash, sleeping problems, sore throat, swelling of feet/legs, swollen lymph glands, thirst - excessive, urination - excessive, urination changes/pain, urine leakage, vision changes, and voice change.         OTHERWISE AS IN HPI  Vital Signs:  Patient profile:   55 year old female Height:      63 inches Weight:      192 pounds BMI:     34.13 BSA:     1.90 Pulse rate:   64 / minute Pulse rhythm:   regular BP sitting:   132 / 76  (left arm) Cuff size:   regular  Vitals Entered By: Ok Anis CMA (September 30, 2009 3:57 PM)  Physical Exam  General:  Well developed,  well nourished, no acute distress. Head:  Normocephalic and atraumatic. Eyes:  PERRLA, no icterus. Lungs:  Clear throughout to auscultation. Heart:  Regular rate and rhythm; no murmurs, rubs,  or bruits. Abdomen:  SOFT, NONTENDER, NO MASS OR HSM,BS+ Rectal:  HEME NEGATIVE, NEGATIVE RECTAL EXAM. SHE HAS A SMALL ROUND ARE OF EDEMA,INDURATION ON HER LEFT BUTTOCK,DRAINING  SMALL AMT OF SEROUS FLUID,SOME SURROUNDING CELLULITIS -ERYTHEMA. Extremities:  No clubbing, cyanosis, edema or deformities noted. Neurologic:  Alert and  oriented x4;  grossly normal neurologically. Psych:  Alert and cooperative. Normal mood and affect.   Impression & Recommendations:  Problem # 1:  SKIN INFECTION LEFT BUTTOCK Assessment New  START SITZ BATHS TWICE DAILY CIPRO 500 MG TWICE DAILY X ONE WEEK PT ADVISED TO CALL BACK IN 4-5 DAYS WITH UPDATE,CAN RECHECK IN OFFICE NEXT THURS/FRIDAY IF SXS PERSIST.SHE WILL CALL BACK SOONER IF WORSENS  Problem # 2:  GERD (ICD-530.81) Assessment: New INTERMITTENT MILD SXS  TRIAL OF OTC PRILOSEC 20 MG X 2 WEEKS THEN AS NEEDED.  Problem # 3:  IBS (ICD-564.1) Assessment: Comment Only NO CHANGE  Patient Instructions: 1)  We are giving you Prilosec samples today, you will take once daily  2)  You have been given instructions on Sitz bath for two times a day  3)  Call on Wednesday to let us know how your doing 4)  If no better, Amy can see  you on Thursday 5)  We are sending you a prescription to your pharmacy 6)  The medication list was reviewed and reconciled.  All changed / newly prescribed medications were explained.  A complete medication list was provided to the patient / caregiver. Prescriptions: CIPRO 500 MG TABS (CIPROFLOXACIN HCL) 1 by mouth two times a day for 7 days  #14 x 0   Entered by:   Merri Ray CMA (AAMA)   Authorized by:   Sammuel Cooper PA-c   Signed by:   Merri Ray CMA (AAMA) on 09/30/2009   Method used:   Electronically to        [id number] (retail)       [address]       [place], [place]  [address]       Ph: [phone] or [phone]       Fax: [phone]   RxID:   [id number]

## 2010-08-29 NOTE — Letter (Signed)
Summary: [facility]  [facility]   Imported By: Lennie Odor 04/21/2010 14:54:56  _____________________________________________________________________  External Attachment:    Type:   Image     Comment:   External Document

## 2010-08-29 NOTE — Letter (Signed)
Summary: [facility]  [facility]   Imported By: Lennie Odor 06/14/2010 14:23:43  _____________________________________________________________________  External Attachment:    Type:   Image     Comment:   External Document

## 2010-08-29 NOTE — Assessment & Plan Note (Signed)
Summary: F/U Check Boil, LT  Buttock   History of Present Illness Visit Type: Follow-up Visit Primary GI MD: Lina Sar MD Primary Provider: Sula Soda, MD  Requesting Provider: n/a Chief Complaint: follow up boil on left buttock.  pt states she is still having pain and area is still draining History of Present Illness:   Patient is back for a recheck of buttock boil. She is on 10th day of Cipro.     GI Review of Systems      Denies abdominal pain, acid reflux, belching, bloating, chest pain, dysphagia with liquids, dysphagia with solids, heartburn, loss of appetite, nausea, vomiting, vomiting blood, weight loss, and  weight gain.      Reports rectal pain.     Denies anal fissure, black tarry stools, change in bowel habit, constipation, diarrhea, diverticulosis, fecal incontinence, heme positive stool, hemorrhoids, irritable bowel syndrome, jaundice, light color stool, liver problems, and  rectal bleeding.   Current Medications (verified): 1)  Fish Oil 1000 Mg  Caps (Omega-3 Fatty Acids) .... 2 Once Daily 2)  Cvs Vit D 5000 High-Potency 5000 Unit Caps (Cholecalciferol) .... Once Daily 3)  Multi-Vitamin  Tabs (Multiple Vitamin) .... Once Daily 4)  Bio-Est Hormone Cream 5)  Cipro 500 Mg Tabs (Ciprofloxacin Hcl) .Marland Kitchen.. 1 By Mouth Two Times A Day For 7 Days  Allergies (verified): 1)  ! Darvocet A500 2)  ! * Latex 3)  Epinephrine 4)  Prometrium (Progesterone Micronized) 5)  Strattera (Atomoxetine Hcl)  Past History:  Past Medical History: Reviewed history from 02/25/2008 and no changes required. Hyperlipidemia Osteoarthritis Osteopenia IBS VitD def Hypothyroidism Poss. ADD L knee pain s/p fx Optic neuritis L 2009  Past Surgical History: Reviewed history from 11/17/2007 and no changes required. Knee surg L for fx  Family History: Reviewed history from 09/30/2009 and no changes required. Family History High cholesterol M had a brain tumor No FH of Colon  Cancer:  Social History: Reviewed history from 07/22/2007 and no changes required. Occupation: not working Married Never Smoked  Vital Signs:  Patient profile:   55 year old female Height:      63 inches Weight:      191 pounds BMI:     33.96 Pulse rate:   64 / minute Pulse rhythm:   regular BP sitting:   124 / 66  (left arm) Cuff size:   regular  Vitals Entered By: Francee Piccolo CMA Duncan Dull) (October 10, 2009 3:54 PM)  Physical Exam  General:  Well developed, well nourished, no acute distress. Rectal:  On left inner buttock there is a dime size lesion, slightly firm, nontender, not draining. On top of the lesion there are 2 small red, raised lesions which resembles granulatoin tissue.  Psych:  Alert and cooperative. Normal mood and affect.   Impression & Recommendations:  Problem # 1:  INFECTION, SKIN AND SOFT TISSUE (ICD-686.9) Assessment Improved On 10th day of Cipro. Lesion dime size, not particularly firm and non-draining. Recommended total of 14 days of Cipro. This is her second infection in this area. I have asked her to follow up with PCP over the next several days to ensure proper healing of the area.    Patient Instructions: 1)  We sent another perscription for 2 day, 2 tab each day of the Cipro. 2)  Gunnar Fusi suggests you call and make an appointment with Dr. Cam Hai and have him look at this as well. 3)  The medication list was reviewed and reconciled.  All changed /  newly prescribed medications were explained.  A complete medication list was provided to the patient / caregiver.  Prescriptions: CIPRO 500 MG TABS (CIPROFLOXACIN HCL) 1 by mouth two times a day for 2 days  #4 tablets x 0   Entered by:   Lowry Ram NCMA   Authorized by:   Willette Cluster NP   Signed by:   Lowry Ram NCMA on 10/10/2009   Method used:   Electronically to        [id number]       [address]       [place], [place]  [address]       Ph: [phone] or  [phone]       Fax: [phone]   RxID:   [id number]

## 2010-08-29 NOTE — Miscellaneous (Signed)
Summary: Appointment No Show  Appointment status changed to no show by [employer] on 03/24/2010 3:24 PM.  No Show Comments ---------------- echo/systolic murmur/jml  Appointment Information ----------------------- Appt Type:  CARDIOLOGY ANCILLARY VISIT      Date:  Friday, March 24, 2010      Time:  11:30 AM for 60 min   Urgency:  Routine   Made By:  Pearson Grippe  To Visit:  [id number]    Reason:  echo/systolic murmur/jml  Appt Comments ------------- -- 03/24/10 15:24: ([facility]) NO SHOW -- echo/systolic murmur/jml -- 03/16/10 12:11: ([facility]) BOOKED -- Routine CARDIOLOGY ANCILLARY VISIT at 03/24/2010 11:30 AM for 60 min echo/systolic murmur/jml

## 2010-08-29 NOTE — Procedures (Signed)
Summary: [facility] COLON   Colonoscopy  Procedure date:  04/30/2007  Findings:      Location:  [facility].    Procedures Next Due Date:    Colonoscopy: 04/2017 Patient Name: Jennifer [name], Jennifer [name] MRN:  Procedure Procedures: Colonoscopy CPT: [address].  Personnel: Endoscopist: Clarissa Laird L. Juanda Chance, MD.  Referred By: Linda Hedges Plotnikov, MD.  Exam Location: Exam performed in Outpatient Clinic. Outpatient  Patient Consent: Procedure, Alternatives, Risks and Benefits discussed, consent obtained, from patient. Consent was obtained by the RN.  Indications Symptoms: rectal pain, irritation, hx of IBS/diarrhea, hx of eating disorder at age 55,she was diagnosed with "colitis".  History  Current Medications: Patient is not currently taking Coumadin.  Pre-Exam Physical: Performed Apr 30, 2007. Entire physical exam was normal.  Comments: Pt. history reviewed/updated, physical exam performed prior to initiation of sedation?yes Exam Exam: Extent of exam reached: Cecum, extent intended: Cecum.  The cecum was identified by appendiceal orifice and IC valve. Duration of exam: time in 11:50 min, out 7:54 min minutes. Colon retroflexion performed. Images taken. ASA Classification: I. Tolerance: good.  Monitoring: Pulse and BP monitoring, Oximetry used. Supplemental O2 given.  Colon Prep Used Moviprep for colon prep. Prep results: good.  Sedation Meds: Patient assessed and found to be appropriate for moderate (conscious) sedation. Fentanyl 100 mcg. given IV. Versed 10 mg. given IV.  Findings - NORMAL EXAM: Cecum. Comments: appendiceal opening identified but no photo available.  - NORMAL EXAM: Rectum.  - MUCOSAL ABNORMALITY: to Rectum. Comments: large anal papillae protruding  through the anal canal.   Assessment Normal examination.  Comments: no polyps, no hemorrhoids, rectal irritation possibly due to hypertrophied anal papillae which protrude through the anal  canal Events  Unplanned Interventions: No intervention was required.  Unplanned Events: There were no complications. Plans Medication Plan: Analpram cream1%: prn, starting Apr 30, 2007   Patient Education: Patient given standard instructions for: Patient instructed to get routine colonoscopy every 10 years.  Disposition: After procedure patient sent to recovery. After recovery patient sent home.   cc: Linda Hedges. Plotnikov, MD  This report was created from the original endoscopy report, which was reviewed and signed by the above listed endoscopist.

## 2010-08-29 NOTE — Assessment & Plan Note (Signed)
Summary: DISCUSS PAIN IN RIGHT LEG-LB   Vital Signs:  Patient profile:   55 year old female Height:      63 inches Weight:      161 pounds BMI:     28.62 Temp:     98.4 degrees F oral Pulse rate:   80 / minute Pulse rhythm:   regular Resp:     16 per minute BP sitting:   110 / 70  (left arm) Cuff size:   regular  Vitals Entered By: Lanier Prude, CMA(AAMA) (April 14, 2010 4:01 PM) CC: Rt leg stabbing pain X 1wk Is Patient Diabetic? No Comments pt is not taking Welbutrin SR.  Please remove from list   Primary Care Eros Montour:  Sula Soda, MD   CC:  Rt leg stabbing pain X 1wk.  History of Present Illness: C/o pain in R leg x x 2-3 d - sharp and worse w/walking  Current Medications (verified): 1)  Fish Oil 1000 Mg  Caps (Omega-3 Fatty Acids) .... 2 Once Daily 2)  Cvs Vit D 5000 High-Potency 5000 Unit Caps (Cholecalciferol) .... Once Daily 3)  Multi-Vitamin  Tabs (Multiple Vitamin) .... Once Daily 4)  Wellbutrin Sr 100 Mg Xr12h-Tab (Bupropion Hcl) .Marland Kitchen.. 1 By Mouth Two Times A Day: Am and Lunch 5)  Bio-Est Hormone Cream  Allergies (verified): 1)  ! Darvocet A500 2)  ! * Latex 3)  Epinephrine 4)  Prometrium (Progesterone Micronized) 5)  Strattera (Atomoxetine Hcl) 6)  Vyvanse (Lisdexamfetamine Dimesylate)  Past History:  Past Medical History: Last updated: 11/04/2009 Hyperlipidemia Osteoarthritis Osteopenia IBS VitD def Hypothyroidism Poss. ADD L knee pain s/p fx Optic neuritis L 2009 Depression  Family History: Last updated: 09/30/2009 Family History High cholesterol M had a brain tumor No FH of Colon Cancer:  Social History: Last updated: 07/22/2007 Occupation: not working Married Never Smoked  Review of Systems       The patient complains of difficulty walking.  The patient denies chest pain, syncope, and dyspnea on exertion.    Physical Exam  General:  Less overweight-appearing.  NAD Msk:  R lat shin is tender to  palpation Extremities:  no edema B Skin:  No rash or ecchymoses   Impression & Recommendations:  Problem # 1:  LEG PAIN (ICD-729.5) - R shin splint Assessment New New shoes Pennsaid No evidence of DVT  Problem # 2:  HRT Assessment: Comment Only She is using "bioidentical HRT" cream. There is no evidence that "bioidentical" are any safer/different from mainstream HRT - we discussed...  Complete Medication List: 1)  Fish Oil 1000 Mg Caps (Omega-3 fatty acids) .... 2 once daily 2)  Cvs Vit D 5000 High-potency 5000 Unit Caps (Cholecalciferol) .... Once daily 3)  Multi-vitamin Tabs (Multiple vitamin) .... Once daily 4)  Wellbutrin Sr 100 Mg Xr12h-tab (Bupropion hcl) .Marland Kitchen.. 1 by mouth two times a day: am and lunch 5)  Bio-est Hormone Cream  6)  Pennsaid 1.5 % Soln (Diclofenac sodium) .... 3-5 gtt on skin three times a day for pain 7)  Diazepam 5 Mg Tabs (Diazepam) .... Take 1,2 or 3 tabs prior and after an mri  Patient Instructions: 1)  Call if you are not better in a reasonable amount of time or if worse.  2)  New shoes  Prescriptions: DIAZEPAM 5 MG TABS (DIAZEPAM) Take 1,2 or 3 tabs prior and after an MRI  #6 x 0   Entered and Authorized by:   Tresa Garter MD   Signed by:  Tresa Garter MD on 04/14/2010   Method used:   Print then Give to Patient   RxID:   [id number] PENNSAID 1.5 % SOLN (DICLOFENAC SODIUM) 3-5 gtt on skin three times a day for pain  #1 x 3   Entered and Authorized by:   Tresa Garter MD   Signed by:   Tresa Garter MD on 04/14/2010   Method used:   Print then Give to Patient   RxID:   [id number]

## 2010-08-29 NOTE — Letter (Signed)
Summary: [facility]   Imported By: Lester [place] 12/14/2009 08:38:32  _____________________________________________________________________  External Attachment:    Type:   Image     Comment:   External Document

## 2010-08-29 NOTE — Assessment & Plan Note (Signed)
Summary: 3 mos f/u / # / cd     Vital Signs:  Patient profile:   55 year old female Height:      63 inches Weight:      193.13 pounds BMI:     34.34 O2 Sat:      96 % on Room air Temp:     98.3 degrees F oral Pulse rate:   87 / minute BP sitting:   130 / 62  (left arm) Cuff size:   regular  Vitals Entered By: Lucious Groves (November 04, 2009 3:24 PM)  O2 Flow:  Room air CC: 3 mo f/u--c/o weight gain./kb Is Patient Diabetic? No Pain Assessment Patient in pain? no        Primary Care Provider:  Sula Soda, MD   CC:  3 mo f/u--c/o weight gain./kb.  History of Present Illness: C/o wt gain, knee pain, elev. chol. C/o depression, ADD  Current Medications (verified): 1)  Fish Oil 1000 Mg  Caps (Omega-3 Fatty Acids) .... 2 Once Daily 2)  Cvs Vit D 5000 High-Potency 5000 Unit Caps (Cholecalciferol) .... Once Daily 3)  Multi-Vitamin  Tabs (Multiple Vitamin) .... Once Daily 4)  Bio-Est Hormone Cream  Allergies (verified): 1)  ! Darvocet A500 2)  ! * Latex 3)  Epinephrine 4)  Prometrium (Progesterone Micronized) 5)  Strattera (Atomoxetine Hcl) 6)  Vyvanse (Lisdexamfetamine Dimesylate)  Past History:  Past Surgical History: Last updated: 11/17/2007 Knee surg L for fx  Social History: Last updated: 07/22/2007 Occupation: not working Married Never Smoked  Past Medical History: Hyperlipidemia Osteoarthritis Osteopenia IBS VitD def Hypothyroidism Poss. ADD L knee pain s/p fx Optic neuritis L 2009 Depression  Review of Systems       The patient complains of depression.  The patient denies fever, dyspnea on exertion, and abdominal pain.    Physical Exam  General:  overweight-appearing.   Nose:  External nasal examination shows no deformity or inflammation. Nasal mucosa are pink and moist without lesions or exudates. Mouth:  Oral mucosa and oropharynx without lesions or exudates.  Teeth in good repair. Lungs:  Normal respiratory effort, chest expands  symmetrically. Lungs are clear to auscultation, no crackles or wheezes. Heart:  Normal rate and regular rhythm. S1 and S2 normal without gallop, murmur, click, rub or other extra sounds. Abdomen:  Bowel sounds positive,abdomen soft and non-tender without masses, organomegaly or hernias noted. Msk:  No deformity or scoliosis noted of thoracic or lumbar spine.  L knee w/pain. Cervical  and thoracic spine is tender to palpation over paraspinal muscles and with the ROM  Extremities:  No clubbing, cyanosis, edema, or deformity noted with normal full range of motion of all joints.   Neurologic:  alert & oriented X3.   Skin:  Intact without suspicious lesions or rashes Psych:  Oriented X3 and memory intact for recent and remote. Not  tearful.  She is depressed and worried, not suicidal.     Impression & Recommendations:  Problem # 1:  HYPERLIPIDEMIA (ICD-272.4) Assessment Deteriorated The labs were reviewed with the patient. 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   Problem # 7:  DEPRESSION (ICD-311) Assessment: Deteriorated  Her updated medication  list for this problem includes:    Wellbutrin Sr 100 Mg Xr12h-tab (Bupropion hcl) .Marland Kitchen... 1 by mouth two times a day: am and lunch  Complete Medication List: 1)  Fish Oil 1000 Mg Caps (Omega-3 fatty acids) .... 2 once daily 2)  Cvs Vit D 5000 High-potency 5000 Unit Caps (Cholecalciferol) .... Once daily 3)  Multi-vitamin Tabs (Multiple vitamin) .... Once daily 4)  Wellbutrin Sr 100 Mg Xr12h-tab (Bupropion hcl) .Marland Kitchen.. 1 by mouth two times a day: am and lunch 5)  Bio-est Hormone  Cream   Patient Instructions: 1)  Please schedule a follow-up appointment in 4 months. 2)  Try to eat more raw plant food, fresh and dry fruit, raw almonds, leafy vegetables, whole foods and less red meat, less animal fat. Poultry and fish is better for you than pork and beef. Avoid processed foods (canned soups, hot dogs, sausage, bacon , frozen dinners). Avoid corn syrup, high fructose syrup or aspartam and Splenda  containing drinks. Honey, Agave and Stevia are better sweeteners. Make your own  dressing with olive oil, wine vinegar, lemon juce, garlic etc. for your salads. 3)  BMP prior to visit, ICD-9: 4)  Hepatic Panel prior to visit, ICD-9: 5)  Lipid Panel prior to visit, ICD-9: 995.20  272.20 6)  Vit D  Prescriptions: WELLBUTRIN SR 100 MG XR12H-TAB (BUPROPION HCL) 1 by mouth two times a day: am and lunch  #60 x 6   Entered and Authorized by:   Tresa Garter MD   Signed by:   Tresa Garter MD on 11/04/2009   Method used:   Print then Give to Patient   RxID:   [id number]

## 2010-08-29 NOTE — Progress Notes (Signed)
Summary: WORK IN TOMORROW?   Phone Note Call from Patient Call back at Home Phone [phone] Call back at Work Phone [phone]   Summary of Call: Pt c/o stabbing pain on the side of her leg, off and on x 2 days. Area is between the ankle and knee but not in the calf per pt. Pain is worse at night. She is scheduled for MRI (unrelated) tomorrow afternoon and will not see anyone but Dr Posey Rea. Would you work pt in tomorrow am?  Initial call taken by: Lamar Sprinkles, CMA,  April 13, 2010 2:23 PM  Follow-up for Phone Call        ok to work in Follow-up by: Tresa Garter MD,  April 14, 2010 7:38 AM  Additional Follow-up for Phone Call Additional follow up Details #1::        Appt Scheduled Today.... Patient id being seen at 3:45pm today by Dr. Posey Rea. Additional Follow-up by: Cristy Hilts, RN,  April 14, 2010 12:15 PM

## 2010-08-29 NOTE — Progress Notes (Signed)
Summary: Vyvanse  Phone Note Call from Patient Call back at [phone]   Summary of Call: Patient called today stating that it has been hard to get Vyvanse dosage adjusted. Patient has tried 40, 20, and lastly 30. She would like to know can she try it at a dosage less than 20 if it is available, but if not she would like to try the 20 again. Please advise. Initial call taken by: Lucious Groves,  September 07, 2009 4:09 PM  Follow-up for Phone Call        20 mg is the lowest Follow-up by: Tresa Garter MD,  September 07, 2009 5:45 PM  Additional Follow-up for Phone Call Additional follow up Details #1::        Pt informed  Additional Follow-up by: Lamar Sprinkles, CMA,  September 08, 2009 12:57 PM    New/Updated Medications: VYVANSE 20 MG CAPS (LISDEXAMFETAMINE DIMESYLATE) 1 by mouth q am Prescriptions: VYVANSE 20 MG CAPS (LISDEXAMFETAMINE DIMESYLATE) 1 by mouth q am  #30 x 0   Entered and Authorized by:   Tresa Garter MD   Signed by:   Lamar Sprinkles, CMA on 09/08/2009   Method used:   Print then Give to Patient   RxID:   [id number]

## 2010-08-31 NOTE — Assessment & Plan Note (Signed)
Summary: 4 MO ROV /NWS  #   Vital Signs:  Patient profile:   55 year old female Height:      63 inches Weight:      162 pounds BMI:     28.80 Temp:     99.0 degrees F oral Pulse rate:   92 / minute Pulse rhythm:   regular Resp:     16 per minute BP sitting:   120 / 72  (left arm) Cuff size:   regular  Vitals Entered By: Lanier Prude, Beverly Gust) (July 14, 2010 3:38 PM) CC: 4 mo f/u  Is Patient Diabetic? No Comments pt is not taking Wellbutrin or Pennsaid.Please remove from list   Primary Care Provider:  Sula Soda, MD   CC:  4 mo f/u .  History of Present Illness: The patient presents for a follow up of back pain, anxiety, depression- worse. C/o mole on back  Current Medications (verified): 1)  Fish Oil 1000 Mg  Caps (Omega-3 Fatty Acids) .... 2 Once Daily 2)  Cvs Vit D 5000 High-Potency 5000 Unit Caps (Cholecalciferol) .... Once Daily 3)  Multi-Vitamin  Tabs (Multiple Vitamin) .... Once Daily 4)  Wellbutrin Sr 100 Mg Xr12h-Tab (Bupropion Hcl) .Marland Kitchen.. 1 By Mouth Two Times A Day: Am and Lunch 5)  Bio-Est Hormone Cream 6)  Pennsaid 1.5 % Soln (Diclofenac Sodium) .... 3-5 Gtt On Skin Three Times A Day For Pain 7)  Diazepam 5 Mg Tabs (Diazepam) .... Take 1,2 or 3 Tabs Prior and After An Mri 8)  Vitamin C 2000mg  .... 1 By Mouth Once Daily  Allergies (verified): 1)  ! Darvocet A500 2)  ! * Latex 3)  Epinephrine 4)  Prometrium (Progesterone Micronized) 5)  Strattera (Atomoxetine Hcl) 6)  Vyvanse (Lisdexamfetamine Dimesylate)  Past History:  Past Surgical History: Last updated: 11/17/2007 Knee surg L for fx  Family History: Last updated: 09/30/2009 Family History High cholesterol M had a brain tumor No FH of Colon Cancer:  Social History: Last updated: 07/22/2007 Occupation: not working Married Never Smoked  Past Medical History: Hyperlipidemia Osteoarthritis Osteopenia IBS VitD def Hypothyroidism Poss. 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  NAD Eyes:  pupils reactive to light, and no injection.  Vision out of L eye is markedly decreased Nose:  External nasal examination shows no deformity or inflammation. Nasal mucosa are pink and moist without lesions or exudates. Mouth:  Oral mucosa and oropharynx without lesions or exudates.  Teeth in good repair. Lungs:  Normal respiratory effort, chest expands symmetrically. Lungs are clear to auscultation, no crackles or wheezes. Heart:  1/6 systolic heart murmur  Abdomen:  Bowel sounds positive,abdomen soft and non-tender without masses, organomegaly or hernias noted. Msk:  R lat shin is tender to palpation Neurologic:  alert & oriented X3.   Skin:  No rash or ecchymoses Psych:  Oriented X3 and memory intact for recent and remote. Not  tearful.  She is depressed and worried, not suicidal.     Impression & Recommendations:  Problem # 1:  OPTIC NEURITIS (ICD-377.30) Assessment Unchanged Discussed incl new dx of meningioma  Problem # 2:  DEPRESSION (ICD-311) Assessment: New  Her updated medication list for this problem includes:    Wellbutrin Sr 100 Mg Xr12h-tab (Bupropion hcl) .Marland Kitchen... 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 1 by mouth two times a day: am and lunch     Problem # 5:  OSTEOARTHRITIS (ICD-715.90) Assessment: Unchanged  Complete Medication List: 1)  Fish Oil 1000 Mg Caps (Omega-3 fatty acids) .... 2 once daily 2)  Cvs Vit D 5000 High-potency 5000 Unit Caps (Cholecalciferol) .... Once daily 3)  Multi-vitamin Tabs (Multiple vitamin) .... Once daily 4)  Wellbutrin Sr 100 Mg Xr12h-tab  (Bupropion hcl) .Marland Kitchen.. 1 by mouth two times a day: am and lunch 5)  Bio-est Hormone Cream  6)  Pennsaid 1.5 % Soln (Diclofenac sodium) .... 3-5 gtt on skin three times a day for pain 7)  Diazepam 5 Mg Tabs (Diazepam) .... Take 1,2 or 3 tabs prior and after an mri 8)  Vitamin C 2000mg   .... 1 by mouth once daily  Patient Instructions: 1)  Please schedule a follow-up appointment in 4 months. 2)  BMP prior to visit, ICD-9: 3)  Hepatic Panel prior to visit, ICD-9: 4)  Lipid Panel prior to visit, ICD-9: 5)  Vit B12 782.0 6)  Vit D 268.90 7)  TSH prior to visit, ICD-9: 8)  CBC w/ Diff prior to visit, ICD-9: 9)  Skin biopsy w/me Prescriptions: DIAZEPAM 5 MG TABS (DIAZEPAM) Take 1,2 or 3 tabs prior and after an MRI  #6 x 0   Entered and Authorized by:   Tresa Garter MD   Signed by:   Tresa Garter MD on 07/14/2010   Method used:   Print then Give to Patient   RxID:   [id number] WELLBUTRIN SR 100 MG XR12H-TAB (BUPROPION HCL) 1 by mouth two times a day: am and lunch  #60 x 6   Entered and Authorized by:   Tresa Garter MD   Signed by:   Tresa Garter MD on 07/14/2010   Method used:   Electronically to        [facility] (retail)       [address]       [place], [place]  [address]       Ph: [phone] or [phone]       Fax: [phone]   RxID:   [id number]    Orders Added: 1)  Est. Patient Level IV [95638]

## 2010-08-31 NOTE — Letter (Signed)
Summary: Neurosurgery/[facility]  Neurosurgery/[facility]   Imported By: Sherian Rein 08/10/2010 14:21:00  _____________________________________________________________________  External Attachment:    Type:   Image     Comment:   External Document

## 2010-09-04 ENCOUNTER — Emergency Department (HOSPITAL_COMMUNITY)
Admission: EM | Admit: 2010-09-04 | Discharge: 2010-09-05 | Disposition: A | Discharge status: Home / Self Care | Payer: BC Managed Care – PPO | Attending: Emergency Medicine | Admitting: Emergency Medicine

## 2010-09-04 DIAGNOSIS — A088 Other specified intestinal infections: Secondary | ICD-10-CM | POA: Insufficient documentation

## 2010-09-04 DIAGNOSIS — E860 Dehydration: Secondary | ICD-10-CM | POA: Insufficient documentation

## 2010-09-04 DIAGNOSIS — K5289 Other specified noninfective gastroenteritis and colitis: Secondary | ICD-10-CM | POA: Insufficient documentation

## 2010-09-05 LAB — DIFFERENTIAL
Basophils Relative: 0 % (ref 0–1)
Lymphs Abs: 1.5 10*3/uL (ref 0.7–4.0)
Monocytes Absolute: 0.3 10*3/uL (ref 0.1–1.0)
Monocytes Relative: 4 % (ref 3–12)
Neutro Abs: 4.5 10*3/uL (ref 1.7–7.7)

## 2010-09-05 LAB — CBC
HCT: 38.9 % (ref 36.0–46.0)
Hemoglobin: 13.7 g/dL (ref 12.0–15.0)
MCH: 31.3 pg (ref 26.0–34.0)
MCHC: 35.2 g/dL (ref 30.0–36.0)
MCV: 88.8 fL (ref 78.0–100.0)

## 2010-09-05 LAB — COMPREHENSIVE METABOLIC PANEL
Albumin: 4.1 g/dL (ref 3.5–5.2)
Alkaline Phosphatase: 54 U/L (ref 39–117)
BUN: 15 mg/dL (ref 6–23)
GFR calc Af Amer: >60 mL/min (ref >60)
Potassium: 3.7 mEq/L (ref 3.5–5.1)
Sodium: 139 mEq/L (ref 135–145)
Total Protein: 6.6 g/dL (ref 6.0–8.3)

## 2010-09-05 LAB — URINALYSIS, ROUTINE W REFLEX MICROSCOPIC
Ketones, ur: 15 mg/dL — AB
Urine Glucose, Fasting: NEGATIVE mg/dL
pH: 6.5 (ref 5.0–8.0)

## 2010-09-16 ENCOUNTER — Encounter: Payer: Self-pay | Admitting: Family Medicine

## 2010-09-16 ENCOUNTER — Ambulatory Visit (INDEPENDENT_AMBULATORY_CARE_PROVIDER_SITE_OTHER): Payer: BC Managed Care – PPO | Admitting: Family Medicine

## 2010-09-16 DIAGNOSIS — J029 Acute pharyngitis, unspecified: Secondary | ICD-10-CM

## 2010-09-19 ENCOUNTER — Telehealth: Payer: Self-pay | Admitting: Internal Medicine

## 2010-09-20 NOTE — Assessment & Plan Note (Signed)
Summary: SORE THROAT/VJ   Vital Signs:  Patient profile:   55 year old female Weight:      161 pounds BMI:     28.62 O2 Sat:      97 % on Room air Temp:     98.4 degrees F oral Pulse rate:   67 / minute BP sitting:   138 / 76  (left arm) Cuff size:   regular  Vitals Entered By: Lamar Sprinkles, CMA (September 16, 2010 11:57 AM)  O2 Flow:  Room air CC: Ret from [place] wed, sore throat becoming worse since wed. Some ear "crackling" ?fever this am/SD Comments NOT TAKING WELBUTRIN/SD   History of Present Illness: 55 yo woman here today for sore throat.  had 16 hr plane ride on Wednesday and immediately developed sore throat.  + nasal congestion.  bilateral ear fullness.  subjective fever this AM.  minimal cough.  + facial pressure.  + sick contacts.  Current Medications (verified): 1)  Fish Oil 1000 Mg  Caps (Omega-3 Fatty Acids) .... 2 Once Daily 2)  Cvs Vit D 5000 High-Potency 5000 Unit Caps (Cholecalciferol) .... Once Daily 3)  Multi-Vitamin  Tabs (Multiple Vitamin) .... Once Daily 4)  Wellbutrin Sr 100 Mg Xr12h-Tab (Bupropion Hcl) .Marland Kitchen.. 1 By Mouth Two Times A Day: Am and Lunch 5)  Bio-Est Hormone Cream 6)  Pennsaid 1.5 % Soln (Diclofenac Sodium) .... 3-5 Gtt On Skin Three Times A Day For Pain 7)  Vitamin C 2000mg  .... 1 By Mouth Once Daily  Allergies (verified): 1)  ! Darvocet A500 2)  ! * Latex 3)  Epinephrine 4)  Prometrium (Progesterone Micronized) 5)  Strattera (Atomoxetine Hcl) 6)  Vyvanse (Lisdexamfetamine Dimesylate)  Review of Systems      See HPI  Physical Exam  General:  Well-developed,well-nourished,in no acute distress; alert,appropriate and cooperative throughout examination Head:  NCAT, no TTP over sinuses Eyes:  no injxn or inflammation Ears:  R ear normal and L ear normal.   Nose:  External nasal examination shows no deformity or inflammation. Nasal mucosa are pink and moist without lesions or exudates. Mouth:  Oral mucosa and oropharynx without lesions  or exudates.  Teeth in good repair. Neck:  No deformities, masses, or tenderness noted. Lungs:  Normal respiratory effort, chest expands symmetrically. Lungs are clear to auscultation, no crackles or wheezes.   Impression & Recommendations:  Problem # 1:  PHARYNGITIS-ACUTE (ICD-462) Assessment New  no evidence of bacterial infxn.  rapid strep (-).  reviewed supportive care and red flags that should prompt return.  Pt expresses understanding and is in agreement w/ this plan.  Orders: Rapid Strep (78469)  Complete Medication List: 1)  Fish Oil 1000 Mg Caps (Omega-3 fatty acids) .... 2 once daily 2)  Cvs Vit D 5000 High-potency 5000 Unit Caps (Cholecalciferol) .... Once daily 3)  Multi-vitamin Tabs (Multiple vitamin) .... Once daily 4)  Wellbutrin Sr 100 Mg Xr12h-tab (Bupropion hcl) .Marland Kitchen.. 1 by mouth two times a day: am and lunch 5)  Bio-est Hormone Cream  6)  Pennsaid 1.5 % Soln (Diclofenac sodium) .... 3-5 gtt on skin three times a day for pain 7)  Vitamin C 2000mg   .... 1 by mouth once daily  Patient Instructions: 1)  This appears to be a virus and should improve w/ time 2)  Drink plenty of fluids 3)  REST! 4)  Ibuprofen for pain and fever 5)  Start Mucinex as needed for nasal congestion 6)  Advil PM as needed for  sleep issues 7)  Hang in there!!!   Orders Added: 1)  Est. Patient Level III [16109] 2)  Rapid Strep [60454]

## 2010-09-21 ENCOUNTER — Telehealth: Payer: Self-pay | Admitting: Internal Medicine

## 2010-09-22 ENCOUNTER — Ambulatory Visit (INDEPENDENT_AMBULATORY_CARE_PROVIDER_SITE_OTHER): Payer: BC Managed Care – PPO | Admitting: Internal Medicine

## 2010-09-22 ENCOUNTER — Encounter: Payer: Self-pay | Admitting: Internal Medicine

## 2010-09-22 DIAGNOSIS — G4700 Insomnia, unspecified: Secondary | ICD-10-CM

## 2010-09-22 DIAGNOSIS — J069 Acute upper respiratory infection, unspecified: Secondary | ICD-10-CM | POA: Insufficient documentation

## 2010-09-26 NOTE — Progress Notes (Signed)
Summary: REMOVE MED  ---- Converted from flag ---- ---- 09/16/2010 8:56 PM, Georgina Quint Plotnikov MD wrote: On 12/16 we talked with her about trying Welbutrin for her ADD traits and depression. She said, as I recall, that she may try it. OK to remove, however. Thank you!    ---- 09/16/2010 11:59 AM, Lamar Sprinkles, CMA wrote: Pt has said many times she is not taking welbutrin. OK to remove from med list, She was upset today that it was still on her med list.  THANKS! ------------------------------  Phone Note From Other Clinic   Summary of Call: REMOVED Initial call taken by: Lamar Sprinkles, CMA,  September 19, 2010 10:28 AM

## 2010-09-26 NOTE — Progress Notes (Signed)
  Phone Note Call from Patient Call back at Home Phone [phone]   Caller: Patient---[phone] Call For: Dr Posey Rea Summary of Call: Pt has nasal congestion,dry hacking cough for over a week with weakness, pt came to Sat clinic 2/18. Pt was not given antibiotic at that time. Pt requesting a work-in appt tomorrow, Friday 2/24 w/Dr Plotnikov. Initial call taken by: Verdell Face,  September 21, 2010 1:45 PM  Follow-up for Phone Call        Pt called back and has workin appt for today,ok Ami. Follow-up by: Verdell Face,  September 22, 2010 3:07 PM

## 2010-10-05 NOTE — Assessment & Plan Note (Signed)
Summary: CONGESTION / PER AVP /NWS   Vital Signs:  Patient profile:   55 year old female Height:      63 inches Weight:      160 pounds BMI:     28.45 Temp:     98.4 degrees F oral Pulse rate:   92 / minute Pulse rhythm:   regular Resp:     16 per minute BP sitting:   128 / 72  (left arm) Cuff size:   regular  Vitals Entered By: Lanier Prude, CMA(AAMA) (September 22, 2010 4:07 PM) CC: sinus congestion, dry cough, HA, weak X 1 wk Is Patient Diabetic? No Comments pt is not taking Pennsaid   Primary Care Provider:  Sula Soda, MD   CC:  sinus congestion, dry cough, HA, and weak X 1 wk.  History of Present Illness: The patient presents with complaints of sore throat, fever, cough, sinus congestion and drainge of seven+ days duration. Not better with OTC meds. Chest hurts with coughing. Can't sleep due to cough. Muscle aches are present.  The mucus is colored.   Current Medications (verified): 1)  Fish Oil 1000 Mg  Caps (Omega-3 Fatty Acids) .... 2 Once Daily 2)  Cvs Vit D 5000 High-Potency 5000 Unit Caps (Cholecalciferol) .... Once Daily 3)  Multi-Vitamin  Tabs (Multiple Vitamin) .... Once Daily 4)  Bio-Est Hormone Cream 5)  Pennsaid 1.5 % Soln (Diclofenac Sodium) .... 3-5 Gtt On Skin Three Times A Day For Pain 6)  Vitamin C 2000mg  .... 1 By Mouth Once Daily  Allergies (verified): 1)  ! Darvocet A500 2)  ! * Latex 3)  Epinephrine 4)  Prometrium (Progesterone Micronized) 5)  Strattera (Atomoxetine Hcl) 6)  Vyvanse (Lisdexamfetamine Dimesylate) 7)  Alprazolam (Alprazolam)  Past History:  Past Medical History: Last updated: 07/14/2010 Hyperlipidemia Osteoarthritis Osteopenia IBS VitD def Hypothyroidism Poss. 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   The patient denies syncope, dyspnea on exertion, abdominal pain, and melena.    Physical Exam  General:  Well-developed,well-nourished,in no acute distress; alert,appropriate and cooperative throughout examination Ears:  R ear normal and L ear normal.   Nose:  External nasal examination shows no deformity  Mouth:  Erythematous throat and intranasal mucosa c/w URI  Lungs:  Normal respiratory effort, chest expands symmetrically. Lungs are clear to auscultation, no crackles or wheezes. Heart:  1/6 systolic heart murmur  Abdomen:  Bowel sounds positive,abdomen soft and non-tender without masses, organomegaly or hernias noted. Msk:  R lat shin is tender to palpation Neurologic:  alert & oriented X3.   Skin:  No rash or ecchymoses Psych:  Oriented X3 and memory intact for recent and remote. Not  tearful.  She is depressed and worried, not suicidal.     Impression & Recommendations:  Problem # 1:  UPPER RESPIRATORY INFECTION, ACUTE (ICD-465.9) - poss sinusitis Assessment New Zpac Her updated medication list for this problem includes:    Tessalon Perles 100 Mg Caps (Benzonatate) .Marland Kitchen... 1-2 by mouth two times a day as needed cogh    Tussionex Pennkinetic Er 8-10 Mg/35ml Lqcr (Chlorpheniramine-hydrocodone) .Marland KitchenMarland KitchenMarland KitchenMarland Kitchen 5 ml by mouth two times a day as needed cough  Problem # 2:  INSOMNIA, CHRONIC (ICD-307.42) Assessment: Deteriorated Will try Diazepam  Problem # 3:  OPTIC NEURITIS (ICD-377.30) Assessment:  Unchanged  Complete Medication List: 1)  Fish Oil 1000 Mg Caps (Omega-3 fatty acids) .... 2 once daily 2)  Cvs Vit D 5000 High-potency 5000 Unit Caps (Cholecalciferol) .... Once daily 3)  Multi-vitamin Tabs (Multiple vitamin) .... Once daily 4)  Bio-est Hormone Cream  5)  Pennsaid 1.5 % Soln (Diclofenac sodium) .... 3-5 gtt on skin three times a day for pain 6)  Vitamin C 2000mg   .... 1 by mouth once daily 7)  Avelox 400 Mg Tabs (Moxifloxacin hcl) .Marland Kitchen.. 1 by mouth once daily x 10 d 8)  Tessalon  Perles 100 Mg Caps (Benzonatate) .Marland Kitchen.. 1-2 by mouth two times a day as needed cogh 9)  Tussionex Pennkinetic Er 8-10 Mg/106ml Lqcr (Chlorpheniramine-hydrocodone) .... 5 ml by mouth two times a day as needed cough 10)  Diazepam 2 Mg Tabs (Diazepam) .Marland Kitchen.. 1-2 at hs as needed insomnia  Patient Instructions: 1)  Use over-the-counter medicines for "cold": Tylenol  650mg  or Advil 400mg  every 6 hours  for fever; Delsym or Robutussin for cough. Mucinex or Mucinex D for congestion. Ricola or Halls for sore throat. Office visit if not better or if worse.  2)  Labs in 1-2 wks 3)  BMP prior to visit, ICD-9: 4)  Hepatic Panel prior to visit, ICD-9: 5)  TSH prior to visit, ICD-9: 6)  CBC w/ Diff prior to visit, ICD-9: 7)  Vit D and Vit B12 and ESR  995.20  780.79 Prescriptions: DIAZEPAM 2 MG TABS (DIAZEPAM) 1-2 at hs as needed insomnia  #60 x 3   Entered and Authorized by:   Tresa Garter MD   Signed by:   Tresa Garter MD on 09/22/2010   Method used:   Print then Give to Patient   RxID:   [id number] TUSSIONEX PENNKINETIC ER 8-10 MG/5ML LQCR (CHLORPHENIRAMINE-HYDROCODONE) 5 ml by mouth two times a day as needed cough  #100 ml x 0   Entered and Authorized by:   Tresa Garter MD   Signed by:   Tresa Garter MD on 09/22/2010   Method used:   Print then Give to Patient   RxID:   [id number] TESSALON PERLES 100 MG CAPS (BENZONATATE) 1-2 by mouth two times a day as needed cogh  #120 x 1   Entered and Authorized by:   Tresa Garter MD   Signed by:   Tresa Garter MD on 09/22/2010   Method used:   Print then Give to Patient   RxID:   [id number] AVELOX 400 MG TABS (MOXIFLOXACIN HCL) 1 by mouth once daily x 10 d  #10 x 1   Entered and Authorized by:   Tresa Garter MD   Signed by:   Tresa Garter MD on 09/22/2010   Method used:   Print then Give to Patient   RxID:   [id number]    Orders Added: 1)  Est. Patient Level IV [89381]

## 2010-10-06 ENCOUNTER — Other Ambulatory Visit: Payer: BC Managed Care – PPO

## 2010-10-10 ENCOUNTER — Encounter: Payer: Self-pay | Admitting: Internal Medicine

## 2010-10-10 LAB — HM MAMMOGRAPHY: HM Mammogram: NORMAL

## 2010-10-16 ENCOUNTER — Encounter: Payer: Self-pay | Admitting: Internal Medicine

## 2010-10-20 ENCOUNTER — Encounter: Payer: Self-pay | Admitting: Internal Medicine

## 2010-10-23 ENCOUNTER — Telehealth: Payer: Self-pay | Admitting: Internal Medicine

## 2010-10-23 NOTE — Telephone Encounter (Signed)
Pt informed

## 2010-10-23 NOTE — Telephone Encounter (Signed)
Jennifer [name] , please, inform the patient: BDS was OK Thank you !

## 2010-10-25 ENCOUNTER — Encounter: Payer: Self-pay | Admitting: Internal Medicine

## 2010-10-26 NOTE — Miscellaneous (Signed)
Summary: mammogram 2012   Clinical Lists Changes  Observations: Added new observation of MAMMOGRAM: normal (10/10/2010 17:58)      Preventive Care Screening  Mammogram:    Date:  10/10/2010    Results:  normal

## 2010-11-06 ENCOUNTER — Other Ambulatory Visit: Payer: Self-pay | Admitting: Internal Medicine

## 2010-11-06 DIAGNOSIS — E539 Vitamin B deficiency, unspecified: Secondary | ICD-10-CM

## 2010-11-06 DIAGNOSIS — R209 Unspecified disturbances of skin sensation: Secondary | ICD-10-CM

## 2010-11-07 ENCOUNTER — Other Ambulatory Visit: Payer: Self-pay

## 2010-11-14 ENCOUNTER — Ambulatory Visit (INDEPENDENT_AMBULATORY_CARE_PROVIDER_SITE_OTHER): Payer: BC Managed Care – PPO | Admitting: Internal Medicine

## 2010-11-14 ENCOUNTER — Encounter: Payer: Self-pay | Admitting: Internal Medicine

## 2010-11-14 DIAGNOSIS — D320 Benign neoplasm of cerebral meninges: Secondary | ICD-10-CM

## 2010-11-14 DIAGNOSIS — H469 Unspecified optic neuritis: Secondary | ICD-10-CM

## 2010-11-14 DIAGNOSIS — E785 Hyperlipidemia, unspecified: Secondary | ICD-10-CM

## 2010-11-14 DIAGNOSIS — D329 Benign neoplasm of meninges, unspecified: Secondary | ICD-10-CM | POA: Insufficient documentation

## 2010-11-14 DIAGNOSIS — B079 Viral wart, unspecified: Secondary | ICD-10-CM

## 2010-11-14 MED ORDER — DHEA 25 MG PO TABS: 25.0000 mg | ORAL_TABLET | Freq: Every day | ORAL | Status: AC

## 2010-11-14 MED ORDER — TRIAMCINOLONE ACETONIDE 0.5 % EX CREA: TOPICAL_CREAM | Freq: Three times a day (TID) | CUTANEOUS | Status: AC

## 2010-11-14 NOTE — Assessment & Plan Note (Signed)
S/p steroid treatment

## 2010-11-14 NOTE — Progress Notes (Signed)
  Subjective:    Patient ID: Jennifer [name], female    DOB: Apr 09, 1956, 55 y.o.   MRN: [id number]  HPI  The patient is here to follow up on chronic depression, anxiety, headaches, dyslipidemia and chronic moderate fibromyalgia symptoms controlled with medicines, diet and exercise. C/o a spot on R post chest that itches   Wt Readings from Last 3 Encounters:  11/14/10 162 lb (73.483 kg)  09/22/10 160 lb (72.576 kg)  09/16/10 161 lb (73.029 kg)    Review of Systems  Constitutional: Negative for chills.  HENT: Negative for nosebleeds and mouth sores.   Respiratory: Negative for shortness of breath.   Gastrointestinal: Negative for blood in stool.  Genitourinary: Negative for frequency.  Skin: Negative for rash.  Psychiatric/Behavioral: Positive for decreased concentration. Negative for behavioral problems and dysphoric mood. The patient is nervous/anxious.        Objective:   Physical Exam  Constitutional: She appears well-developed and well-nourished. No distress.       NAD Overweight  HENT:  Head: Normocephalic.  Right Ear: External ear normal.  Left Ear: External ear normal.  Nose: Nose normal.  Mouth/Throat: Oropharynx is clear and moist.  Eyes: Conjunctivae are normal. Right eye exhibits no discharge. Left eye exhibits no discharge.  Neck: Normal range of motion. Neck supple. No JVD present. No tracheal deviation present. No thyromegaly present.  Cardiovascular: Normal rate, regular rhythm and normal heart sounds.   Pulmonary/Chest: No stridor. No respiratory distress. She has no wheezes.  Abdominal: Soft. Bowel sounds are normal. She exhibits no distension and no mass. There is no tenderness. There is no rebound and no guarding.  Musculoskeletal: She exhibits no edema and no tenderness.  Lymphadenopathy:    She has no cervical adenopathy.  Neurological: She displays normal reflexes. No cranial nerve deficit. She exhibits normal muscle tone. Coordination normal.  Skin:  No rash noted. No erythema.  Psychiatric: She has a normal mood and affect. Her behavior is normal. Judgment and thought content normal.        Wart on R post chest # papules om R wrist, 1 onL  Assessment & Plan:  OPTIC NEURITIS S/p steroid treatment  HYPERLIPIDEMIA Labs reviewed - LDL 218 HDL 80    Fatigue Wart Rash   Procedure Note :     Procedure : Cryosurgery   Indication:  Wart R post chest   Risks including unsuccessful procedure , bleeding, infection, bruising, scar, a need for a repeat  procedure and others were explained to the patient in detail as well as the benefits. Informed consent was obtained verbally.    1 lesion(s)  on  R post chest  was treated with liquid nitrogen on a Q-tip in a usual fasion . Band-Aid was applied and antibiotic ointment was given for a later use.   Tolerated well. Complications none.   Postprocedure instructions :     Keep the wounds clean. You can wash them with liquid soap and water. Pat dry with gauze or a Kleenex tissue  Before applying antibiotic ointment and a Band-Aid.   You need to report immediately  if  any signs of infection develop.

## 2010-11-14 NOTE — Assessment & Plan Note (Signed)
Labs reviewed - LDL 218 HDL 80

## 2010-11-16 ENCOUNTER — Encounter: Payer: Self-pay | Admitting: Internal Medicine

## 2010-11-20 ENCOUNTER — Encounter: Payer: Self-pay | Admitting: Internal Medicine

## 2010-11-23 ENCOUNTER — Encounter: Payer: Self-pay | Admitting: Internal Medicine

## 2011-01-25 ENCOUNTER — Other Ambulatory Visit: Payer: Self-pay | Admitting: *Deleted

## 2011-01-25 ENCOUNTER — Other Ambulatory Visit (INDEPENDENT_AMBULATORY_CARE_PROVIDER_SITE_OTHER): Payer: BC Managed Care – PPO

## 2011-01-25 ENCOUNTER — Ambulatory Visit (INDEPENDENT_AMBULATORY_CARE_PROVIDER_SITE_OTHER): Payer: BC Managed Care – PPO | Admitting: Internal Medicine

## 2011-01-25 VITALS — BP 120/70 | HR 72 | Temp 98.0°F | Resp 12

## 2011-01-25 DIAGNOSIS — E785 Hyperlipidemia, unspecified: Secondary | ICD-10-CM

## 2011-01-25 DIAGNOSIS — R209 Unspecified disturbances of skin sensation: Secondary | ICD-10-CM

## 2011-01-25 DIAGNOSIS — D329 Benign neoplasm of meninges, unspecified: Secondary | ICD-10-CM

## 2011-01-25 DIAGNOSIS — F988 Other specified behavioral and emotional disorders with onset usually occurring in childhood and adolescence: Secondary | ICD-10-CM

## 2011-01-25 DIAGNOSIS — D320 Benign neoplasm of cerebral meninges: Secondary | ICD-10-CM

## 2011-01-25 DIAGNOSIS — E559 Vitamin D deficiency, unspecified: Secondary | ICD-10-CM

## 2011-01-25 DIAGNOSIS — E539 Vitamin B deficiency, unspecified: Secondary | ICD-10-CM

## 2011-01-25 DIAGNOSIS — B079 Viral wart, unspecified: Secondary | ICD-10-CM

## 2011-01-25 DIAGNOSIS — R208 Other disturbances of skin sensation: Secondary | ICD-10-CM

## 2011-01-25 DIAGNOSIS — M79609 Pain in unspecified limb: Secondary | ICD-10-CM

## 2011-01-25 DIAGNOSIS — H469 Unspecified optic neuritis: Secondary | ICD-10-CM

## 2011-01-25 LAB — CBC WITH DIFFERENTIAL/PLATELET
Basophils Relative: 0.6 % (ref 0.0–3.0)
Eosinophils Absolute: 0.2 10*3/uL (ref 0.0–0.7)
Eosinophils Relative: 4.1 % (ref 0.0–5.0)
HCT: 39.1 % (ref 36.0–46.0)
Lymphs Abs: 2.7 10*3/uL (ref 0.7–4.0)
MCHC: 34.4 g/dL (ref 30.0–36.0)
MCV: 92.4 fl (ref 78.0–100.0)
Monocytes Absolute: 0.4 10*3/uL (ref 0.1–1.0)
Platelets: 292 10*3/uL (ref 150.0–400.0)
RBC: 4.23 Mil/uL (ref 3.87–5.11)
WBC: 5 10*3/uL (ref 4.5–10.5)

## 2011-01-25 LAB — BASIC METABOLIC PANEL
GFR: 112.61 mL/min (ref >60.00)
Glucose, Bld: 100 mg/dL — ABNORMAL HIGH (ref 70–99)
Potassium: 4.1 mEq/L (ref 3.5–5.1)
Sodium: 140 mEq/L (ref 135–145)

## 2011-01-25 LAB — HEPATIC FUNCTION PANEL
AST: 19 U/L (ref 0–37)
Alkaline Phosphatase: 52 U/L (ref 39–117)
Bilirubin, Direct: 0.1 mg/dL (ref 0.0–0.3)
Total Bilirubin: 0.6 mg/dL (ref 0.3–1.2)

## 2011-01-25 MED ORDER — ACYCLOVIR 800 MG PO TABS: 800.0000 mg | ORAL_TABLET | Freq: Every day | ORAL | Status: AC

## 2011-01-25 NOTE — Patient Instructions (Signed)
Take Advil 200 mg 2 tabs 3 times a day as needed for foot pain with food Take Acyclovir if rash Call if not better please

## 2011-01-27 NOTE — Progress Notes (Signed)
  Subjective:    Patient ID: Jamiracle Avants, female    DOB: Jun 06, 1956, 55 y.o.   MRN: [id number]  HPI    Review of Systems     Objective:   Physical Exam        Assessment & Plan:   Subjective:    Patient ID: Audrena Talaga, female    DOB: 01/16/1956, 55 y.o.   MRN: [id number]  HPI  C/o pain over the top of R foot several episodes, some severe over past few days. No pain now. No injury The patient is here to follow up on chronic ADD, meningioma. C/o a spot on R arm     Review of Systems  Constitutional: Negative for chills.  HENT: Negative for nosebleeds and mouth sores.   Respiratory: Negative for shortness of breath.   Gastrointestinal: Negative for blood in stool.  Genitourinary: Negative for frequency.  Skin: Negative for rash.  Psychiatric/Behavioral: Positive for decreased concentration. Negative for behavioral problems and dysphoric mood. The patient is nervous/anxious.        Objective:   Physical Exam  Constitutional: She appears well-developed and well-nourished. No distress.       NAD Overweight  HENT:  Head: Normocephalic.  Right Ear: External ear normal.  Left Ear: External ear normal.  Nose: Nose normal.  Mouth/Throat: Oropharynx is clear and moist.  Eyes: Conjunctivae are normal. Right eye exhibits no discharge. Left eye exhibits no discharge.  Neck: Normal range of motion. Neck supple. No JVD present. No tracheal deviation present. No thyromegaly present.  Cardiovascular: Normal rate, regular rhythm and normal heart sounds.   Pulmonary/Chest: No stridor. No respiratory distress. She has no wheezes.  Abdominal: Soft. Bowel sounds are normal. She exhibits no distension and no mass. There is no tenderness. There is no rebound and no guarding.  Musculoskeletal: She exhibits no edema and no tenderness. B feet NT. Lymphadenopathy:    She has no cervical adenopathy.  Neurological: She displays normal reflexes. No cranial nerve deficit. She  exhibits normal muscle tone. Coordination normal.  Skin: No rash noted. No erythema.  Psychiatric: She has a normal mood and affect. Her behavior is normal. Judgment and thought content normal.        Wart on R lat dist arm 3 mm   Assessment & Plan:  80    Fatigue Wart Rash   Procedure Note :     Procedure : Cryosurgery   Indication:  Wart R dist arm   Risks including unsuccessful procedure , bleeding, infection, bruising, scar, a need for a repeat  procedure and others were explained to the patient in detail as well as the benefits. Informed consent was obtained verbally.    1 lesion(s)  on  R dist arm  was treated with liquid nitrogen on a Q-tip in a usual fasion . Band-Aid was applied and antibiotic ointment was given for a later use.   Tolerated well. Complications none.   Postprocedure instructions :     Keep the wounds clean. You can wash them with liquid soap and water. Pat dry with gauze or a Kleenex tissue  Before applying antibiotic ointment and a Band-Aid.   You need to report immediately  if  any signs of infection develop.

## 2011-01-28 ENCOUNTER — Encounter: Payer: Self-pay | Admitting: Internal Medicine

## 2011-01-28 NOTE — Assessment & Plan Note (Signed)
As per "Dysesthesia" part

## 2011-01-28 NOTE — Assessment & Plan Note (Addendum)
Not on Rx. Options discussed

## 2011-01-28 NOTE — Assessment & Plan Note (Signed)
F/u w/Neurology 

## 2011-01-28 NOTE — Assessment & Plan Note (Signed)
Relieve pressure If rash (doubt) - take acyclovir

## 2011-01-29 ENCOUNTER — Telehealth: Payer: Self-pay | Admitting: Internal Medicine

## 2011-01-29 NOTE — Telephone Encounter (Signed)
Jennifer [name], please, inform patient that all labs are normal except for elev B12 level - cut back on Vit B12 dose pls Thx

## 2011-01-30 LAB — LIPID PANEL: Total CHOL/HDL Ratio: 3

## 2011-01-30 NOTE — Telephone Encounter (Signed)
Left mess for patient to call back.  

## 2011-01-30 NOTE — Telephone Encounter (Signed)
Pt informed

## 2011-02-01 LAB — LDL CHOLESTEROL, DIRECT: Direct LDL: 202.5 mg/dL

## 2011-02-02 ENCOUNTER — Telehealth: Payer: Self-pay | Admitting: Internal Medicine

## 2011-02-02 NOTE — Telephone Encounter (Signed)
Left detailed mess informing pt of below. Copies mailed.  

## 2011-02-02 NOTE — Telephone Encounter (Signed)
Jennifer [name], please, inform patient that all labs are normal except for high chol and high Vit B12. Reduce vit B12 to 1/2 dose. Low fat diet.  Please, mail the labs to the patient.    Thx

## 2011-02-02 NOTE — Telephone Encounter (Signed)
Left mess for patient to call back.  

## 2011-03-21 ENCOUNTER — Ambulatory Visit (INDEPENDENT_AMBULATORY_CARE_PROVIDER_SITE_OTHER): Payer: BC Managed Care – PPO | Admitting: Internal Medicine

## 2011-03-21 ENCOUNTER — Encounter: Payer: Self-pay | Admitting: Internal Medicine

## 2011-03-21 VITALS — BP 120/60 | HR 84 | Temp 98.4°F | Resp 16 | Wt 171.0 lb

## 2011-03-21 DIAGNOSIS — F329 Major depressive disorder, single episode, unspecified: Secondary | ICD-10-CM

## 2011-03-21 DIAGNOSIS — F411 Generalized anxiety disorder: Secondary | ICD-10-CM

## 2011-03-21 DIAGNOSIS — E559 Vitamin D deficiency, unspecified: Secondary | ICD-10-CM

## 2011-03-21 DIAGNOSIS — F4323 Adjustment disorder with mixed anxiety and depressed mood: Secondary | ICD-10-CM

## 2011-03-21 DIAGNOSIS — E039 Hypothyroidism, unspecified: Secondary | ICD-10-CM

## 2011-03-21 DIAGNOSIS — M1990 Unspecified osteoarthritis, unspecified site: Secondary | ICD-10-CM

## 2011-03-21 DIAGNOSIS — E785 Hyperlipidemia, unspecified: Secondary | ICD-10-CM

## 2011-03-21 MED ORDER — DESVENLAFAXINE SUCCINATE ER 50 MG PO TB24: 50.0000 mg | ORAL_TABLET | Freq: Every day | ORAL | Status: DC

## 2011-03-30 NOTE — Assessment & Plan Note (Signed)
Try Pennsaid 

## 2011-03-30 NOTE — Assessment & Plan Note (Signed)
Take daily 1000 iu Vit D po

## 2011-03-30 NOTE — Assessment & Plan Note (Signed)
Fish oil

## 2011-03-30 NOTE — Progress Notes (Signed)
  Subjective:    Patient ID: Boneta Standre, female    DOB: June 05, 1956, 55 y.o.   MRN: [id number]  HPI   The patient is here to follow up on chronic depression, anxiety symptoms. Her sleep pattern is off. C/o stress. Review of Systems  Constitutional: Negative for chills, activity change, appetite change, fatigue and unexpected weight change.  HENT: Negative for congestion, mouth sores and sinus pressure.   Eyes: Negative for visual disturbance.  Respiratory: Negative for cough and chest tightness.   Gastrointestinal: Negative for nausea and abdominal pain.  Genitourinary: Negative for frequency, difficulty urinating and vaginal pain.  Musculoskeletal: Positive for arthralgias and gait problem. Negative for back pain.  Skin: Negative for pallor and rash.  Neurological: Negative for dizziness, tremors, weakness, numbness and headaches.  Psychiatric/Behavioral: Positive for sleep disturbance and dysphoric mood. Negative for confusion. The patient is nervous/anxious.        Objective:   Physical Exam  Constitutional: She appears well-developed and well-nourished. No distress.  HENT:  Head: Normocephalic.  Right Ear: External ear normal.  Left Ear: External ear normal.  Nose: Nose normal.  Mouth/Throat: Oropharynx is clear and moist.  Eyes: Conjunctivae are normal. Pupils are equal, round, and reactive to light. Right eye exhibits no discharge. Left eye exhibits no discharge.       As before  Neck: Normal range of motion. Neck supple. No JVD present. No tracheal deviation present. No thyromegaly present.  Cardiovascular: Normal rate, regular rhythm and normal heart sounds.   Pulmonary/Chest: No stridor. No respiratory distress. She has no wheezes.  Abdominal: Soft. Bowel sounds are normal. She exhibits no distension and no mass. There is no tenderness. There is no rebound and no guarding.  Musculoskeletal: She exhibits no edema and no tenderness.  Lymphadenopathy:    She has no  cervical adenopathy.  Neurological: She displays normal reflexes. No cranial nerve deficit. She exhibits normal muscle tone. Coordination normal.  Skin: No rash noted. No erythema.  Psychiatric: Her behavior is normal. Judgment and thought content normal.          Assessment & Plan:

## 2011-03-30 NOTE — Assessment & Plan Note (Signed)
Diazepam prn Start Pristique

## 2011-03-30 NOTE — Assessment & Plan Note (Signed)
On Rx 

## 2011-03-30 NOTE — Assessment & Plan Note (Signed)
Start Pristique See S Bond

## 2011-04-03 ENCOUNTER — Telehealth: Payer: Self-pay | Admitting: *Deleted

## 2011-04-03 NOTE — Telephone Encounter (Signed)
Message copied by Merrilyn Puma on Tue Apr 03, 2011  8:48 AM ------      Message from: Janeal Holmes      Created: Fri Mar 30, 2011  4:48 PM       Misty Stanley, please, inform patient that after I did my research, I'm referring  her to see a psychologist, Judithe Modest.      Thx

## 2011-04-03 NOTE — Telephone Encounter (Signed)
Left mess for patient to call back.  

## 2011-04-11 NOTE — Telephone Encounter (Signed)
Left mess for patient to call back.  

## 2011-04-13 NOTE — Telephone Encounter (Signed)
Pt informed

## 2011-05-08 LAB — CARBOXYHEMOGLOBIN
Carboxyhemoglobin: 0.6
O2 Saturation: 14.7

## 2011-06-13 ENCOUNTER — Ambulatory Visit (INDEPENDENT_AMBULATORY_CARE_PROVIDER_SITE_OTHER): Payer: BC Managed Care – PPO | Admitting: Internal Medicine

## 2011-06-13 ENCOUNTER — Encounter: Payer: Self-pay | Admitting: Internal Medicine

## 2011-06-13 DIAGNOSIS — F988 Other specified behavioral and emotional disorders with onset usually occurring in childhood and adolescence: Secondary | ICD-10-CM

## 2011-06-13 DIAGNOSIS — M545 Low back pain, unspecified: Secondary | ICD-10-CM | POA: Insufficient documentation

## 2011-06-13 DIAGNOSIS — F411 Generalized anxiety disorder: Secondary | ICD-10-CM

## 2011-06-13 DIAGNOSIS — M79604 Pain in right leg: Secondary | ICD-10-CM | POA: Insufficient documentation

## 2011-06-13 DIAGNOSIS — G4700 Insomnia, unspecified: Secondary | ICD-10-CM

## 2011-06-13 MED ORDER — ATOMOXETINE HCL 18 MG PO CAPS: 18.0000 mg | ORAL_CAPSULE | Freq: Every day | ORAL | Status: DC

## 2011-06-13 MED ORDER — ZOLPIDEM TARTRATE 1.75 MG SL SUBL: 1.0000 | SUBLINGUAL_TABLET | Freq: Every evening | SUBLINGUAL | Status: DC | PRN

## 2011-06-13 NOTE — Assessment & Plan Note (Signed)
Start prn Intermezzo low dose

## 2011-06-13 NOTE — Progress Notes (Signed)
C/o R LS and hip pain C/o depression C/o insomnia C/o ADD sx's -- worse  HPI   Past Medical History  Diagnosis Date  . Hyperlipidemia   . Osteoarthritis   . Osteopenia   . IBS (irritable bowel syndrome)   . Vitamin D deficiency   . Hypothyroidism   . ADD (attention deficit disorder)   . Knee pain   . Optic neuritis 2009    Left  . Depression   . Anxiety   . 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 Ascorbic Acid (VITAMIN C) 1000 MG tablet Take 2,000 mg by mouth daily.        . Cholecalciferol (VITAMIN D-3) 5000 UNITS TABS Take by mouth daily.        . diazepam (VALIUM) 2 MG tablet Take 2-4 mg by mouth at bedtime as needed.        . fish oil-omega-3 fatty acids 1000 MG capsule Take 2 g by mouth daily.        . Multiple Vitamins-Minerals (MULTIVITAMIN,TX-MINERALS) tablet Take 1 tablet by mouth daily.        . NON FORMULARY BIO-EST Hormone cream       . ARMOUR THYROID PO Take 0.05 each by mouth daily after breakfast.        . Diclofenac Sodium (PENNSAID) 1.5 % SOLN Place 3-5 drops onto the skin 3 (three) times daily as needed.        . triamcinolone (KENALOG) 0.5 % cream Apply topically 3 (three) times daily.  60 g  0    Allergies  Allergen Reactions  . Alprazolam     REACTION: palpitations  . Epinephrine   . Latex   . Pristiq (Desvenlafaxine Succinate Monohydrate)     Weird feeling  . Progesterone     REACTION: depression and wt gain  . Propoxyphene N-Acetaminophen     REACTION: side affects    Family History  Problem Relation Age of Onset  . Colon cancer Neg Hx   . Heart disease Other   . Cancer Mother     meningioma    History   Social History  . Marital Status: Married    Spouse Name: N/A    Number of Children: N/A  . Years of Education: N/A   Occupational History  . Not Working    Social History Main Topics  . Smoking status: Former Smoker    Quit date: 07/31/1983  . Smokeless  tobacco: Not on file  . Alcohol Use: No  . Drug Use: No  . Sexually Active: Yes   Other Topics Concern  . Not on file   Social History Narrative  . No narrative on file    ROS ALL NEGATIVE EXCEPT THOSE NOTED IN HPI  PE  General Appearance: well developed, well nourished, obese in no acute distress HEENT: symmetrical face, PERRLA, good dentition  Neck: no JVD, thyromegaly, or adenopathy, trachea midline Chest: symmetric without deformity Cardiac: PMI non-displaced, RRR, normal S1, S2, no gallop or murmur Lung: clear to ausculation and percussion Vascular: all pulses full without bruits  Abdominal: nondistended, nontender, good bowel sounds, no HSM, no bruits Extremities: no cyanosis, clubbing or edema, no sign of DVT, no varicosities  Skin: normal color, no rashes Neuro: alert and oriented x 3, non-focal Pysch: normal affect, sad LS and R hip are tender w/ROM    BMET    Component Value Date/Time  NA 140 01/25/2011 1158   K 4.1 01/25/2011 1158   CL 106 01/25/2011 1158   CO2 28 01/25/2011 1158   GLUCOSE 100* 01/25/2011 1158   GLUCOSE 89 07/10/2006 1553   BUN 16 01/25/2011 1158   CREATININE 0.6 01/25/2011 1158   CALCIUM 9.4 01/25/2011 1158   GFRNONAA >60 09/05/2010 0040   GFRAA  Value: >60        The eGFR has been calculated using the MDRD equation. This calculation has not been validated in all clinical situations. eGFR's persistently <60 mL/min signify possible Chronic Kidney Disease. 09/05/2010 0040    Lipid Panel     Component Value Date/Time   CHOL 287* 01/25/2011 1158   TRIG 70.0 01/25/2011 1158   HDL 96.50 01/25/2011 1158   CHOLHDL 3 01/25/2011 1158   VLDL 14.0 01/25/2011 1158    CBC    Component Value Date/Time   WBC 5.0 01/25/2011 1158   RBC 4.23 01/25/2011 1158   HGB 13.4 01/25/2011 1158   HCT 39.1 01/25/2011 1158   PLT 292.0 01/25/2011 1158   MCV 92.4 01/25/2011 1158   MCH 31.3 09/05/2010 0040   MCHC 34.4 01/25/2011 1158   RDW 13.8 01/25/2011 1158   LYMPHSABS 2.7  01/25/2011 1158   MONOABS 0.4 01/25/2011 1158   EOSABS 0.2 01/25/2011 1158   BASOSABS 0.0 01/25/2011 1158

## 2011-06-13 NOTE — Assessment & Plan Note (Signed)
Start Strattera.

## 2011-06-13 NOTE — Assessment & Plan Note (Signed)
Continue with current prescription therapy as reflected on the Med list. prn 

## 2011-06-13 NOTE — Assessment & Plan Note (Signed)
We can start PT X ray

## 2011-07-09 DIAGNOSIS — H43819 Vitreous degeneration, unspecified eye: Secondary | ICD-10-CM | POA: Insufficient documentation

## 2011-07-19 ENCOUNTER — Telehealth: Payer: Self-pay | Admitting: *Deleted

## 2011-07-19 NOTE — Telephone Encounter (Signed)
PA for Intermezzo is approved from 07-02-11 to 03-27-14. Pharmacy & pt informed.

## 2011-08-15 ENCOUNTER — Encounter: Payer: Self-pay | Admitting: Internal Medicine

## 2011-08-15 ENCOUNTER — Other Ambulatory Visit: Payer: Self-pay | Admitting: Family Medicine

## 2011-08-15 ENCOUNTER — Ambulatory Visit (INDEPENDENT_AMBULATORY_CARE_PROVIDER_SITE_OTHER): Payer: BC Managed Care – PPO | Admitting: Internal Medicine

## 2011-08-15 DIAGNOSIS — F329 Major depressive disorder, single episode, unspecified: Secondary | ICD-10-CM

## 2011-08-15 DIAGNOSIS — F411 Generalized anxiety disorder: Secondary | ICD-10-CM

## 2011-08-15 DIAGNOSIS — E049 Nontoxic goiter, unspecified: Secondary | ICD-10-CM

## 2011-08-15 DIAGNOSIS — G4700 Insomnia, unspecified: Secondary | ICD-10-CM

## 2011-08-15 DIAGNOSIS — H43399 Other vitreous opacities, unspecified eye: Secondary | ICD-10-CM

## 2011-08-15 MED ORDER — OSELTAMIVIR PHOSPHATE 75 MG PO CAPS: 75.0000 mg | ORAL_CAPSULE | Freq: Two times a day (BID) | ORAL | Status: AC

## 2011-08-15 MED ORDER — DIAZEPAM 2 MG PO TABS: 2.0000 mg | ORAL_TABLET | Freq: Every evening | ORAL | Status: DC | PRN

## 2011-08-15 NOTE — Assessment & Plan Note (Signed)
Start prn Zolpidem

## 2011-08-15 NOTE — Assessment & Plan Note (Signed)
Not on Rx 

## 2011-08-15 NOTE — Assessment & Plan Note (Signed)
11/12 R S/p ophth eval

## 2011-08-15 NOTE — Assessment & Plan Note (Signed)
Continue with current prescription therapy as reflected on the Med list.  

## 2011-08-15 NOTE — Progress Notes (Signed)
  Subjective:    Patient ID: Jennifer [name], female    DOB: April 20, 1956, 56 y.o.   MRN: [id number]  HPI   The patient is here to follow up on chronic insomnia, depression, anxiety, headaches and floaters symptoms controlled with medicines, diet  PT helped   Review of Systems  Constitutional: Positive for fatigue. Negative for chills, activity change, appetite change and unexpected weight change.  HENT: Negative for congestion, mouth sores and sinus pressure.   Eyes: Negative for visual disturbance.  Respiratory: Negative for cough and chest tightness.   Gastrointestinal: Negative for nausea and abdominal pain.  Genitourinary: Negative for frequency, difficulty urinating and vaginal pain.  Musculoskeletal: Negative for back pain and gait problem.  Skin: Negative for pallor and rash.  Neurological: Negative for dizziness, tremors, weakness, numbness and headaches.  Psychiatric/Behavioral: Positive for sleep disturbance. Negative for suicidal ideas and confusion. The patient is nervous/anxious.        Objective:   Physical Exam  Constitutional: She appears well-developed. No distress.  HENT:  Head: Normocephalic.  Right Ear: External ear normal.  Left Ear: External ear normal.  Nose: Nose normal.  Mouth/Throat: Oropharynx is clear and moist.  Eyes: Conjunctivae are normal. Pupils are equal, round, and reactive to light. Right eye exhibits no discharge. Left eye exhibits no discharge.  Neck: Normal range of motion. Neck supple. No JVD present. No tracheal deviation present. No thyromegaly present.  Cardiovascular: Normal rate, regular rhythm and normal heart sounds.   Pulmonary/Chest: No stridor. No respiratory distress. She has no wheezes.  Abdominal: Soft. Bowel sounds are normal. She exhibits no distension and no mass. There is no tenderness. There is no rebound and no guarding.  Musculoskeletal: She exhibits no edema and no tenderness.  Lymphadenopathy:    She has no cervical  adenopathy.  Neurological: She displays normal reflexes. No cranial nerve deficit. She exhibits normal muscle tone. Coordination normal.  Skin: No rash noted. No erythema.  Psychiatric: She has a normal mood and affect. Her behavior is normal. Judgment and thought content normal.          Assessment & Plan:

## 2011-09-03 ENCOUNTER — Ambulatory Visit
Admission: RE | Admit: 2011-09-03 | Discharge: 2011-09-03 | Disposition: A | Discharge status: Home / Self Care | Payer: BC Managed Care – PPO | Source: Ambulatory Visit | Attending: Family Medicine | Admitting: Family Medicine

## 2011-09-03 DIAGNOSIS — E049 Nontoxic goiter, unspecified: Secondary | ICD-10-CM

## 2011-09-13 DIAGNOSIS — R52 Pain, unspecified: Secondary | ICD-10-CM | POA: Insufficient documentation

## 2011-09-27 DIAGNOSIS — S9032XA Contusion of left foot, initial encounter: Secondary | ICD-10-CM | POA: Insufficient documentation

## 2011-10-19 ENCOUNTER — Telehealth: Payer: Self-pay | Admitting: *Deleted

## 2011-10-19 DIAGNOSIS — E041 Nontoxic single thyroid nodule: Secondary | ICD-10-CM

## 2011-10-19 NOTE — Telephone Encounter (Signed)
Pt states she needs a thyroid bx with Barbie Haggis, MD at [facility]. Her # is [phone]. She states [facility] is sending over u/s results.

## 2011-10-19 NOTE — Telephone Encounter (Signed)
Yes please

## 2011-10-19 NOTE — Telephone Encounter (Signed)
Done. Thx.

## 2011-10-19 NOTE — Telephone Encounter (Signed)
Noted. Do I need to make a referral?  Thx

## 2011-11-14 ENCOUNTER — Encounter: Payer: Self-pay | Admitting: Internal Medicine

## 2011-11-14 ENCOUNTER — Ambulatory Visit (INDEPENDENT_AMBULATORY_CARE_PROVIDER_SITE_OTHER): Payer: BC Managed Care – PPO | Admitting: Internal Medicine

## 2011-11-14 VITALS — BP 120/70 | HR 72 | Temp 98.1°F | Resp 16 | Wt 174.0 lb

## 2011-11-14 DIAGNOSIS — E039 Hypothyroidism, unspecified: Secondary | ICD-10-CM

## 2011-11-14 DIAGNOSIS — F329 Major depressive disorder, single episode, unspecified: Secondary | ICD-10-CM

## 2011-11-14 DIAGNOSIS — G4700 Insomnia, unspecified: Secondary | ICD-10-CM

## 2011-11-14 DIAGNOSIS — F988 Other specified behavioral and emotional disorders with onset usually occurring in childhood and adolescence: Secondary | ICD-10-CM

## 2011-11-14 NOTE — Assessment & Plan Note (Signed)
Continue with current prescription therapy as reflected on the Med list.  

## 2011-11-14 NOTE — Assessment & Plan Note (Signed)
She is not willing to take Rx

## 2011-11-14 NOTE — Progress Notes (Signed)
Patient ID: Jennifer [name], female   DOB: Oct 10, 1955, 56 y.o.   MRN: [id number]  Subjective:    Patient ID: Jennifer [name], female    DOB: Nov 18, 1955, 56 y.o.   MRN: [id number]  HPI   The patient is here to follow up on chronic insomnia, depression, anxiety, headaches and floaters symptoms controlled with medicines, diet  PT helped  She had a [place] bx in [place] non-guided  bx by an endocrinologist - not happy w/results - can't sing high notes  BP Readings from Last 3 Encounters:  11/14/11 120/70  08/15/11 120/78  06/13/11 110/68   Wt Readings from Last 3 Encounters:  11/14/11 174 lb (78.926 kg)  08/15/11 175 lb (79.379 kg)  06/13/11 175 lb (79.379 kg)      Review of Systems  Constitutional: Positive for fatigue. Negative for chills, activity change, appetite change and unexpected weight change.  HENT: Negative for congestion, mouth sores and sinus pressure.   Eyes: Negative for visual disturbance.  Respiratory: Negative for cough and chest tightness.   Gastrointestinal: Negative for nausea and abdominal pain.  Genitourinary: Negative for frequency, difficulty urinating and vaginal pain.  Musculoskeletal: Negative for back pain and gait problem.  Skin: Negative for pallor and rash.  Neurological: Negative for dizziness, tremors, weakness, numbness and headaches.  Psychiatric/Behavioral: Positive for sleep disturbance. Negative for suicidal ideas and confusion. The patient is nervous/anxious.        Objective:   Physical Exam  Constitutional: She appears well-developed. No distress.  HENT:  Head: Normocephalic.  Right Ear: External ear normal.  Left Ear: External ear normal.  Nose: Nose normal.  Mouth/Throat: Oropharynx is clear and moist.  Eyes: Conjunctivae are normal. Pupils are equal, round, and reactive to light. Right eye exhibits no discharge. Left eye exhibits no discharge.  Neck: Normal range of motion. Neck supple. No JVD present. No tracheal deviation  present. No thyromegaly present.  Cardiovascular: Normal rate, regular rhythm and normal heart sounds.   Pulmonary/Chest: No stridor. No respiratory distress. She has no wheezes.  Abdominal: Soft. Bowel sounds are normal. She exhibits no distension and no mass. There is no tenderness. There is no rebound and no guarding.  Musculoskeletal: She exhibits no edema and no tenderness.  Lymphadenopathy:    She has no cervical adenopathy.  Neurological: She displays normal reflexes. No cranial nerve deficit. She exhibits normal muscle tone. Coordination normal.  Skin: No rash noted. No erythema.  Psychiatric: She has a normal mood and affect. Her behavior is normal. Judgment and thought content normal.    Lab Results  Component Value Date   WBC 5.0 01/25/2011   HGB 13.4 01/25/2011   HCT 39.1 01/25/2011   PLT 292.0 01/25/2011   GLUCOSE 100* 01/25/2011   CHOL 287* 01/25/2011   TRIG 70.0 01/25/2011   HDL 96.50 01/25/2011   LDLDIRECT 202.5 01/25/2011   ALT 19 01/25/2011   AST 19 01/25/2011   NA 140 01/25/2011   K 4.1 01/25/2011   CL 106 01/25/2011   CREATININE 0.6 01/25/2011   BUN 16 01/25/2011   CO2 28 01/25/2011   TSH 3.36 01/25/2011   HGBA1C 5.7 04/10/2007         Assessment & Plan:

## 2011-11-14 NOTE — Assessment & Plan Note (Signed)
Chronic, refractory  Potential benefits of a long term benzodiazepines  use as well as potential risks  and complications were explained to the patient and were aknowledged. Intermezzo did not work

## 2011-11-14 NOTE — Patient Instructions (Signed)
You can try Viibrid 10 mg 1/4 tab a day

## 2011-11-14 NOTE — Assessment & Plan Note (Signed)
Options discussed 

## 2011-11-18 ENCOUNTER — Encounter: Payer: Self-pay | Admitting: Internal Medicine

## 2011-11-30 ENCOUNTER — Telehealth: Payer: Self-pay | Admitting: *Deleted

## 2011-11-30 MED ORDER — DIAZEPAM 2 MG PO TABS: 2.0000 mg | ORAL_TABLET | Freq: Every evening | ORAL | Status: DC | PRN

## 2011-11-30 NOTE — Telephone Encounter (Signed)
Done. Pt informed.

## 2011-11-30 NOTE — Telephone Encounter (Signed)
Pt is calling requesting Rf on Valium. She states Dr. Posey Rea asked her at last OV if she needed Rf on this and she stated no. She thought she had some and doesn't. She is requesting Rf. OK?

## 2011-11-30 NOTE — Telephone Encounter (Signed)
Done hardcopy to robin  

## 2011-12-10 ENCOUNTER — Institutional Professional Consult (permissible substitution): Payer: BC Managed Care – PPO | Admitting: Pulmonary Disease

## 2011-12-17 ENCOUNTER — Ambulatory Visit (INDEPENDENT_AMBULATORY_CARE_PROVIDER_SITE_OTHER): Payer: BC Managed Care – PPO | Admitting: Pulmonary Disease

## 2011-12-17 ENCOUNTER — Encounter: Payer: Self-pay | Admitting: Pulmonary Disease

## 2011-12-17 VITALS — BP 108/72 | HR 80 | Temp 98.2°F | Ht 63.0 in | Wt 175.4 lb

## 2011-12-17 DIAGNOSIS — G4700 Insomnia, unspecified: Secondary | ICD-10-CM

## 2011-12-17 NOTE — Progress Notes (Signed)
  Subjective:    Patient ID: Jennifer [name], female    DOB: 1956-03-16, 56 y.o.   MRN: [id number]  HPI The patient is a 56 year old female who I been asked to see for insomnia.  The patient states that she has had sleeping issues for years, and she believes it started when she was treated with high doses of prednisone for a meningioma.  Currently the patient tries to go to bed around 12 midnight, and feels that she can get to sleep most nights within 30 minutes.  She rarely watches TV or reads in the bedroom.  Her husband travels quite a bit, and she is unsure if she snores or if she kicks a lot during the night.  He has never commented on symptoms that were worrisome for sleep apnea.  The patient will typically awaken around 3 AM, and not be able to get back to sleep.  She complains of her "mind racing", and has a history of anxiety and depression.  She will typically stay in the bed, and tosses/turns.  She can sometimes get back to sleep in a reasonable period of time, and at other times within hours or not at all.  She will typically get up to start her day between 7 AM and 12 noon.  She will usually drink 2 cups of coffee in the morning, and occasional iced tea in the afternoon.  She denies any napping during the day.  She has been tried on different sleep medications, including short acting benzodiazepines without improvement.   Review of Systems  Constitutional: Negative.  Negative for fever and unexpected weight change.  HENT: Negative.  Negative for ear pain, nosebleeds, congestion, sore throat, rhinorrhea, sneezing, trouble swallowing, dental problem, postnasal drip and sinus pressure.   Eyes: Negative.  Negative for redness and itching.  Respiratory: Negative.  Negative for cough, chest tightness, shortness of breath and wheezing.   Cardiovascular: Negative.  Negative for palpitations and leg swelling.  Gastrointestinal: Negative for nausea and vomiting.       Heartburn    Genitourinary:  Negative.  Negative for dysuria.  Musculoskeletal: Negative.  Negative for joint swelling.  Skin: Negative.  Negative for rash.  Neurological: Negative.  Negative for headaches.  Hematological: Negative.  Does not bruise/bleed easily.  Psychiatric/Behavioral: Negative for dysphoric mood. The patient is nervous/anxious.        Objective:   Physical Exam Constitutional:  Overweight female, no acute distress  HENT:  Nares patent without discharge, mild septal deviation to left  Oropharynx without exudate, palate and uvula are normal  Eyes:  Perrla, eomi, no scleral icterus  Neck:  No JVD, no TMG  Cardiovascular:  Normal rate, regular rhythm, no rubs or gallops.  No murmurs        Intact distal pulses  Pulmonary :  Normal breath sounds, no stridor or respiratory distress   No rales, rhonchi, or wheezing  Abdominal:  Soft, nondistended, bowel sounds present.  No tenderness noted.   Musculoskeletal:  No lower extremity edema noted.  Lymph Nodes:  No cervical lymphadenopathy noted  Skin:  No cyanosis noted  Neurologic:  Alert, appropriate, moves all 4 extremities without obvious deficit.         Assessment & Plan:

## 2011-12-17 NOTE — Patient Instructions (Signed)
Establish bedtime of , but never stay in bed more than if you cannot initiate sleep. If you awaken during the night, and cannot return to sleep within , leave bedroom and watch tv or read in family room.  No eating or drinking, no working, no physical activity, no computer, no puzzles.  Once you feel sleepy again, try to return to bedroom.  If can't get back to sleep within , start over. Get up everyday by 7am no matter how little you have slept. No napping during day, and no caffeine after 10am. No computer after 10pm Can try melatonin 3mg  about 3-4 hours BEFORE bedtime. Please call me in 2 weeks with how things are going.

## 2011-12-17 NOTE — Assessment & Plan Note (Addendum)
The patient primarily has sleep maintenance issues, although she has sleep onset issues at times as well.  There is nothing to suggest an overt sleep disorder such as sleep apnea or periodic limb movement syndrome, however the patient sleeps alone many nights and is unsure if any of these apply.  I have explained to her that we may at some point need to test for these, but would like to get her sleeping better first.  She clearly has some sleep hygiene issues that need to be addressed.  I have discussed with her what constitutes good sleep hygiene, as well as stimulus control therapy.  I suspect anxiety is also playing a major role here.  She may ultimately need cognitive behavioral therapy from a psychologist or a psychiatrist.

## 2012-01-25 ENCOUNTER — Telehealth: Payer: Self-pay | Admitting: Obstetrics and Gynecology

## 2012-01-28 NOTE — Telephone Encounter (Signed)
LMTC @9 :28

## 2012-01-30 ENCOUNTER — Telehealth: Payer: Self-pay

## 2012-02-01 NOTE — Telephone Encounter (Signed)
LM to call @ 9:15  ld

## 2012-02-13 ENCOUNTER — Ambulatory Visit (INDEPENDENT_AMBULATORY_CARE_PROVIDER_SITE_OTHER): Payer: BC Managed Care – PPO

## 2012-02-13 ENCOUNTER — Ambulatory Visit (INDEPENDENT_AMBULATORY_CARE_PROVIDER_SITE_OTHER): Payer: BC Managed Care – PPO | Admitting: Obstetrics and Gynecology

## 2012-02-13 ENCOUNTER — Ambulatory Visit: Payer: Self-pay | Admitting: Obstetrics and Gynecology

## 2012-02-13 ENCOUNTER — Encounter: Payer: Self-pay | Admitting: Obstetrics and Gynecology

## 2012-02-13 VITALS — BP 112/72 | Ht 63.0 in | Wt 172.0 lb

## 2012-02-13 DIAGNOSIS — K589 Irritable bowel syndrome without diarrhea: Secondary | ICD-10-CM

## 2012-02-13 DIAGNOSIS — M899 Disorder of bone, unspecified: Secondary | ICD-10-CM

## 2012-02-13 DIAGNOSIS — E569 Vitamin deficiency, unspecified: Secondary | ICD-10-CM

## 2012-02-13 DIAGNOSIS — IMO0002 Reserved for concepts with insufficient information to code with codable children: Secondary | ICD-10-CM

## 2012-02-13 DIAGNOSIS — Z124 Encounter for screening for malignant neoplasm of cervix: Secondary | ICD-10-CM

## 2012-02-13 DIAGNOSIS — Z01419 Encounter for gynecological examination (general) (routine) without abnormal findings: Secondary | ICD-10-CM

## 2012-02-13 DIAGNOSIS — N905 Atrophy of vulva: Secondary | ICD-10-CM

## 2012-02-13 MED ORDER — ESTRADIOL 2 MG VA RING: 2.0000 mg | VAGINAL_RING | VAGINAL | Status: DC

## 2012-02-13 NOTE — Progress Notes (Signed)
The patient is taking hormone replacement therapy The patient  is taking a Calcium supplement. Post-menopausal bleeding:no  Last Pap: was normal September  2011 Last mammogram: was normal pt stated 2 years ago  Last DEXA scan : T=     Pt unsure  Last colonoscopy:normal 8 years ago per pt.   Urinary symptoms: urinary frequency Normal bowel movements: Yes Reports abuse at home: No  Subjective:    Jennifer [name] is a 56 y.o. female G1P0010 who presents for annual exam. Previously seen by Henreitta Leber PA-C. The patient C/O dyspareunia for years recently worsening. Menopause for 5 years. Currently using Biest-testosterone prescribed by Dr Quillian Quince in [facility] for the last 6-8 years.Previous user of Vagifem / Estrace cream. Symptoms have not improved with lubricants. Also frequent urination and difficulty "holding" urine. Nycturia up to 2-3 times per night Currently seeing Loma Boston PT for pelvic floor PT.   No H/O DVT.  The following portions of the patient's history were reviewed and updated as appropriate: allergies, current medications, past family history, past medical history, past social history, past surgical history and problem list.  Review of Systems Pertinent items are noted in HPI. Gastrointestinal:intermittent constipation for a few months, no abdominal pain, no rectal bleeding Genitourinary:negative for dysuria, frequency, hematuria, nocturia and urinary incontinence    Objective:     BP 112/72  Ht 5\' 3"  (1.6 m)  Wt 172 lb (78.019 kg)  BMI 30.47 kg/m2  Weight:  Wt Readings from Last 1 Encounters:  02/13/12 172 lb (78.019 kg)     BMI: Body mass index is 30.47 kg/(m^2). General Appearance: Alert, appropriate appearance for age. No acute distress HEENT: Grossly normal Neck / Thyroid: Supple, no masses, nodes or enlargement Lungs: clear to auscultation bilaterally Back: No CVA tenderness Breast Exam: No masses or nodes.No dimpling, nipple retraction or  discharge. Cardiovascular: Regular rate and rhythm. S1, S2, no murmur Gastrointestinal: Soft, non-tender, no masses or organomegaly Pelvic Exam: Vulva and vagina with moderate to severe atrophy. Cervix is normal. Uterus and adnexa are normal Rectovaginal: normal rectal, no masses Lymphatic Exam: Non-palpable nodes in neck, clavicular, axillary, or inguinal regions Skin: no rash or abnormalities Neurologic: Normal gait and speech, no tremor  Psychiatric: Alert and oriented, appropriate affect.       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 Pt to initiate dilator therapy with Loma Boston Recommend silicone-base lubricant or Olive oil Follow-up:  1 week for insertion and instructions on how to insert Estring

## 2012-02-14 ENCOUNTER — Telehealth: Payer: Self-pay | Admitting: Obstetrics and Gynecology

## 2012-02-14 LAB — PAP IG W/ RFLX HPV ASCU

## 2012-02-18 ENCOUNTER — Other Ambulatory Visit: Payer: Self-pay

## 2012-02-18 ENCOUNTER — Telehealth: Payer: Self-pay | Admitting: Internal Medicine

## 2012-02-18 NOTE — Telephone Encounter (Signed)
Left a message for patient to call me. 

## 2012-02-18 NOTE — Telephone Encounter (Signed)
appt scheduled with SR for pt to have estring inserted. Very insistent that she see SR for this and no one else.  appt is 03-13-12 @ 2:00.  [facility], will call to make sure rx was r/c.  ld

## 2012-02-19 NOTE — Telephone Encounter (Signed)
Spoke with patient and she reports a change in her bowel habits. States she is having intermittent constipation, bloating and a feeling of fullness.  States in the past her IBS caused diarrhea. This is worrying her due to the bloating and full feeling. Wants to be seen. Scheduled patient tomorrow at 8:15 AM.

## 2012-02-20 ENCOUNTER — Other Ambulatory Visit (INDEPENDENT_AMBULATORY_CARE_PROVIDER_SITE_OTHER): Payer: BC Managed Care – PPO

## 2012-02-20 ENCOUNTER — Ambulatory Visit (INDEPENDENT_AMBULATORY_CARE_PROVIDER_SITE_OTHER): Payer: BC Managed Care – PPO | Admitting: Internal Medicine

## 2012-02-20 ENCOUNTER — Encounter: Payer: Self-pay | Admitting: Internal Medicine

## 2012-02-20 VITALS — BP 124/68 | HR 74 | Ht 63.0 in | Wt 171.0 lb

## 2012-02-20 DIAGNOSIS — R140 Abdominal distension (gaseous): Secondary | ICD-10-CM

## 2012-02-20 DIAGNOSIS — R194 Change in bowel habit: Secondary | ICD-10-CM

## 2012-02-20 DIAGNOSIS — R198 Other specified symptoms and signs involving the digestive system and abdomen: Secondary | ICD-10-CM

## 2012-02-20 DIAGNOSIS — R141 Gas pain: Secondary | ICD-10-CM

## 2012-02-20 DIAGNOSIS — R142 Eructation: Secondary | ICD-10-CM

## 2012-02-20 DIAGNOSIS — R109 Unspecified abdominal pain: Secondary | ICD-10-CM

## 2012-02-20 LAB — SEDIMENTATION RATE: Sed Rate: 10 mm/hr (ref 0–22)

## 2012-02-20 MED ORDER — HYOSCYAMINE SULFATE 0.125 MG SL SUBL: SUBLINGUAL_TABLET | SUBLINGUAL | Status: DC

## 2012-02-20 MED ORDER — OMEPRAZOLE 20 MG PO CPDR: 20.0000 mg | DELAYED_RELEASE_CAPSULE | Freq: Every day | ORAL | Status: DC

## 2012-02-20 MED ORDER — ALIGN 4 MG PO CAPS: 1.0000 | ORAL_CAPSULE | Freq: Every day | ORAL | Status: DC

## 2012-02-20 NOTE — Progress Notes (Signed)
Jennifer [name] 08/13/1955 MRN [id number]  History of Present Illness:  This is a 56 year old white female with history of an eating disorder at age 52 and irritable bowel syndrome. We saw her in 2008 for a screening colonoscopy which was normal. She is now having bloating, irregular bowel habits and an uncomfortable feeling in her abdomen which started several months ago. She has tried probiotics. She says that it is different than her irritable bowel syndrome. She has gained a lot of weight from taking steroids for a meningioma which was diagnosed several years ago. She has constipation and diarrhea but denies rectal bleeding. There is a positive family history of gallbladder disease in her mother. She has a thyroid nodule which has been biopsied but her TSH level has been normal. She is postmenopausal.   Past Medical History  Diagnosis Date  . Hyperlipidemia   . Osteoarthritis   . Osteopenia   . IBS (irritable bowel syndrome)   . Vitamin d deficiency   . Hypothyroidism   . ADD (attention deficit disorder)   . Knee pain   . Optic neuritis 2009    Left  . Depression   . Anxiety   . Meningioma 2011    Near optic nerve  . Complication of anesthesia     Difficulty waking up  . H/O mitral valve prolapse   . H/O bladder infections   . Dyspareunia, female 2006  . Kidney infection   . Renal stones 2006  . Menopausal symptoms 2006  . Libido, decreased 2006  . PMB (postmenopausal bleeding) 2006  . Vaginal spotting 2006  . Excessive menses 2006  . Vaginal dryness 2007  . Vaginal atrophy 2007  . Urinary urgency 2009  . Hx: UTI (urinary tract infection) 2009  . H/O folliculitis 2010  . Allergy     latex   Past Surgical History  Procedure Date  . Knee surgery     Left for Fracture  . Wisdom tooth extraction   . Tonsillectomy     reports that she quit smoking about 28 years ago. Her smoking use included Cigarettes. She has a 8 pack-year smoking history. She has never used  smokeless tobacco. She reports that she drinks about 4.2 ounces of alcohol per week. She reports that she does not use illicit drugs. family history includes Cancer in her mother and Heart disease in her other.  There is no history of Colon cancer. Allergies  Allergen Reactions  . Alprazolam     REACTION: palpitations  . Epinephrine   . Latex   . Pristiq (Desvenlafaxine Succinate Monohydrate)     Weird feeling  . Progesterone     REACTION: depression and wt gain  . Propoxyphene-Acetaminophen     REACTION: side affects        Review of Systems: Denies heartburn dysphagia jaundice  The remainder of the 10 point ROS is negative except as outlined in H&P   Physical Exam: General appearance  Well developed, in no distress. Eyes- non icteric. HEENT nontraumatic, normocephalic. Mouth no lesions, tongue papillated, no cheilosis. Neck supple without adenopathy, thyroid not enlarged, no carotid bruits, no JVD. Lungs Clear to auscultation bilaterally. Cor normal S1, normal S2, regular rhythm, no murmur,  quiet precordium. Abdomen: Soft with diffuse tenderness throughout more so in epigastrium and left lower quadrant. No palpable mass. Rectal: Small amount of Hemoccult negative stool Extremities no pedal edema. Skin no lesions. Neurological alert and oriented x 3. Psychological normal mood and affect.  Assessment and Plan:  Problem #1 Abdominal bloating, discomfort and change in bowel habits in a patient with a history of irritable bowel syndrome. She feels that her symptoms are different from her usual IBS. We will proceed with a CT scan of the abdomen and pelvis with IV and oral contrast to rule out ovarian cancer or an intra-abdominal process. She has a positive family of gallbladder disease. We will start her on Prilosec 20 mg daily for a month and also add Levsin sublingual 0.125 mg every 4-6 hours when necessary. I have given her samples of a probiotic to take daily. We will be  checking her TSH and sedimentation rate. If symptoms continue, I would consider repeating a colonoscopy.   02/20/2012 Lina Sar

## 2012-02-20 NOTE — Patient Instructions (Addendum)
Your physician has requested that you go to the basement for the following lab work before leaving today: TSH, Sed Rate  You have been scheduled for a CT scan of the abdomen and pelvis at [facility] ([address]---this is in the same building as [facility]).   You are scheduled on 02/22/12 at 9:00 am. You should arrive 15 minutes prior to your appointment time for registration. Please follow the written instructions below on the day of your exam:  WARNING: IF YOU ARE ALLERGIC TO IODINE/X-RAY DYE, PLEASE NOTIFY RADIOLOGY IMMEDIATELY AT [phone]! YOU WILL BE GIVEN A 13 HOUR PREMEDICATION PREP.  1) Do not eat or drink anything after 5:00 am (4 hours prior to your test) 2) You have been given 2 bottles of oral contrast to drink. The solution may taste better if refrigerated, but do NOT add ice or any other liquid to this solution. Shake well before drinking.    Drink 1 bottle of contrast @ 7:00 am (2 hours prior to your exam)  Drink 1 bottle of contrast @ 8:00 am (1 hour prior to your exam)  You may take any medications as prescribed with a small amount of water except for the following: Metformin, Glucophage, Glucovance, Avandamet, Riomet, Fortamet, Actoplus Met, Janumet, Glumetza or Metaglip. The above medications must be held the day of the exam AND 48 hours after the exam.  The purpose of you drinking the oral contrast is to aid in the visualization of your intestinal tract. The contrast solution may cause some diarrhea. Before your exam is started, you will be given a small amount of fluid to drink. Depending on your individual set of symptoms, you may also receive an intravenous injection of x-ray contrast/dye. Plan on being at [facility] for 30 minutes or long, depending on the type of exam you are having performed.  If you have any questions regarding your exam or if you need to reschedule, you may call the CT department at [phone] between the hours of  8:00 am and 5:00 pm, Monday-Friday.  ________________________________________________________________________  We have given you samples of Align. This puts good bacteria back into your colon. You should take 1 capsule by mouth once daily. If this works well for you, it can be purchased over the counter. We have sent the following medications to your pharmacy for you to pick up at your convenience: Levsin CC: Dr Chari Manning, Dr Posey Rea

## 2012-02-22 ENCOUNTER — Telehealth: Payer: Self-pay | Admitting: *Deleted

## 2012-02-22 ENCOUNTER — Other Ambulatory Visit: Payer: BC Managed Care – PPO

## 2012-02-22 NOTE — Telephone Encounter (Signed)
Patient called with concerns regarding ultrasound vs. CT scan. She states that she has a meningioma which is thought to have been caused by radiation (although she notes that she has brain CT every 6 months). Therefore, she is wondering if she should have ultrasound instead of CT scan due to that fact. States she did not voice this to Dr Juanda Chance at her appointment so she wants to make sure Dr Juanda Chance is aware of her condition before going forward with CT. Patient states that she trusts Dr Regino Schultze decisions but is not sure that she knew about the meningioma before. Dr Juanda Chance, would you still like CT scan?

## 2012-02-22 NOTE — Telephone Encounter (Signed)
Actually, if the pt has concerned about radiation , then schedule her for MRI of the abd/pelvis instead

## 2012-02-22 NOTE — Telephone Encounter (Signed)
I have left a message for patient to call back. 

## 2012-02-25 NOTE — Telephone Encounter (Signed)
I have left a voicemail for patient to call back. 

## 2012-02-27 NOTE — Telephone Encounter (Signed)
Called patient again. She states that she thinks she would like to go forward with MRI but she needs to discuss this with her husband first. She states she will decide by next week.

## 2012-03-04 ENCOUNTER — Telehealth: Payer: Self-pay | Admitting: *Deleted

## 2012-03-04 NOTE — Telephone Encounter (Signed)
See previous phone note dated 02-22-12. Patient stated that she would call us back with a decision regarding whether she would go forward with MRI. She has not called back. Since she is quite aware that she needs a radiology test, I will wait on her return call before going forward with any scheduling.

## 2012-03-04 NOTE — Telephone Encounter (Signed)
Noted  

## 2012-03-04 NOTE — Telephone Encounter (Signed)
If she calls back, ask her if the medications I gave her are helping. If they are, then she may not need the MRI. If she is still having blosting, then in place of MRI she can have an upper abdominal ultrasound.

## 2012-03-13 ENCOUNTER — Telehealth: Payer: Self-pay | Admitting: Internal Medicine

## 2012-03-13 ENCOUNTER — Encounter: Payer: BC Managed Care – PPO | Admitting: Obstetrics and Gynecology

## 2012-03-13 DIAGNOSIS — R140 Abdominal distension (gaseous): Secondary | ICD-10-CM

## 2012-03-13 NOTE — Telephone Encounter (Signed)
Unable to reach patient because mailbox is full

## 2012-03-14 NOTE — Telephone Encounter (Signed)
Yes, she can have ultrasound instead

## 2012-03-14 NOTE — Telephone Encounter (Signed)
Unable to reach patient because mailbox full. Left a message at home number.

## 2012-03-14 NOTE — Telephone Encounter (Signed)
She would like to have the ultrasound instead of MRI. States the medication helped some but still has bloating. Can I schedule Abdominal ultrasound? Please, advise.

## 2012-03-17 NOTE — Telephone Encounter (Signed)
Scheduled ultrasound of abdomen at [facility] with Alinda Money on 03/20/12 7:45/8:00 AM. NPO after midnight.Left a message for patient to call me.

## 2012-03-18 NOTE — Telephone Encounter (Signed)
Left a message for patient with appointment time/ instructions and request for her to call to confirm receipt at her home number.

## 2012-03-19 NOTE — Telephone Encounter (Signed)
Spoke with patient and gave her appointment time/instructions for ultrasound.

## 2012-03-19 NOTE — Telephone Encounter (Signed)
Left a message for patient to call me. 

## 2012-03-20 ENCOUNTER — Ambulatory Visit (HOSPITAL_COMMUNITY)
Admission: RE | Admit: 2012-03-20 | Discharge: 2012-03-20 | Disposition: A | Discharge status: Home / Self Care | Payer: BC Managed Care – PPO | Source: Ambulatory Visit | Attending: Internal Medicine | Admitting: Internal Medicine

## 2012-03-20 DIAGNOSIS — R142 Eructation: Secondary | ICD-10-CM | POA: Insufficient documentation

## 2012-03-20 DIAGNOSIS — R141 Gas pain: Secondary | ICD-10-CM | POA: Insufficient documentation

## 2012-03-20 DIAGNOSIS — R140 Abdominal distension (gaseous): Secondary | ICD-10-CM

## 2012-03-20 DIAGNOSIS — K219 Gastro-esophageal reflux disease without esophagitis: Secondary | ICD-10-CM | POA: Insufficient documentation

## 2012-03-28 ENCOUNTER — Encounter: Payer: Self-pay | Admitting: Obstetrics and Gynecology

## 2012-03-28 ENCOUNTER — Ambulatory Visit (INDEPENDENT_AMBULATORY_CARE_PROVIDER_SITE_OTHER): Payer: BC Managed Care – PPO | Admitting: Obstetrics and Gynecology

## 2012-03-28 VITALS — BP 110/64 | Wt 172.0 lb

## 2012-03-28 DIAGNOSIS — N905 Atrophy of vulva: Secondary | ICD-10-CM

## 2012-03-28 MED ORDER — ESTRADIOL 2 MG VA RING: 2.0000 mg | VAGINAL_RING | VAGINAL | Status: AC

## 2012-03-28 NOTE — Progress Notes (Signed)
  Subjective:    Jennifer [name] is a 56 y.o. female, G1P0010, who presents for Estring insertion for severe atrophy but did not fill prescription because was called in the wrong pharmacy   The following portions of the patient's history were reviewed and updated as appropriate: allergies, current medications, past family history.  Review of Systems Pertinent items are noted in HPI   Objective:    BP 110/64  Wt 172 lb (78.019 kg)    Weight:  Wt Readings from Last 1 Encounters:  03/28/12 172 lb (78.019 kg)          BMI: There is no height on file to calculate BMI.  General Appearance: Alert, appropriate appearance for age. No acute distress GYN exam: deferred   Assessment:    deferred    Plan:    Rescheduled to 04/02/12. [facility]     [facility]

## 2012-04-01 ENCOUNTER — Telehealth: Payer: Self-pay | Admitting: Obstetrics and Gynecology

## 2012-04-01 NOTE — Telephone Encounter (Signed)
Laura/resched. req.

## 2012-04-02 NOTE — Telephone Encounter (Signed)
R/S to 04-02-12  ld

## 2012-04-18 ENCOUNTER — Ambulatory Visit (INDEPENDENT_AMBULATORY_CARE_PROVIDER_SITE_OTHER): Payer: BC Managed Care – PPO | Admitting: Obstetrics and Gynecology

## 2012-04-18 ENCOUNTER — Encounter: Payer: Self-pay | Admitting: Obstetrics and Gynecology

## 2012-04-18 VITALS — BP 118/68 | Wt 172.0 lb

## 2012-04-18 DIAGNOSIS — N952 Postmenopausal atrophic vaginitis: Secondary | ICD-10-CM

## 2012-04-18 NOTE — Progress Notes (Signed)
Subjective:    Jennifer [name] is a 56 y.o. female, G1P0010, who presents for Estring insertion.    Objective:    BP 118/68  Wt 172 lb (78.019 kg)    Weight:  Wt Readings from Last 1 Encounters:  04/18/12 172 lb (78.019 kg)          BMI: There is no height on file to calculate BMI.  General Appearance: Alert, appropriate appearance for age. No acute distress GYN exam: Estring instructions reviewed. Estring inserted easily. Pt walked and voided without difficulty.   Assessment:    Severe vulvovaginal atrophy    Plan:    Estring inserted Follow-up 3 months     [place], Vernona Rieger PMD

## 2012-06-10 ENCOUNTER — Emergency Department (HOSPITAL_COMMUNITY)
Admission: EM | Admit: 2012-06-10 | Discharge: 2012-06-10 | Disposition: A | Discharge status: Home / Self Care | Payer: BC Managed Care – PPO | Attending: Emergency Medicine | Admitting: Emergency Medicine

## 2012-06-10 ENCOUNTER — Encounter (HOSPITAL_COMMUNITY): Payer: Self-pay | Admitting: *Deleted

## 2012-06-10 DIAGNOSIS — Y9389 Activity, other specified: Secondary | ICD-10-CM | POA: Insufficient documentation

## 2012-06-10 DIAGNOSIS — S6990XA Unspecified injury of unspecified wrist, hand and finger(s), initial encounter: Secondary | ICD-10-CM | POA: Insufficient documentation

## 2012-06-10 DIAGNOSIS — Z8719 Personal history of other diseases of the digestive system: Secondary | ICD-10-CM | POA: Insufficient documentation

## 2012-06-10 DIAGNOSIS — E785 Hyperlipidemia, unspecified: Secondary | ICD-10-CM | POA: Insufficient documentation

## 2012-06-10 DIAGNOSIS — Z8679 Personal history of other diseases of the circulatory system: Secondary | ICD-10-CM | POA: Insufficient documentation

## 2012-06-10 DIAGNOSIS — Z872 Personal history of diseases of the skin and subcutaneous tissue: Secondary | ICD-10-CM | POA: Insufficient documentation

## 2012-06-10 DIAGNOSIS — Z8739 Personal history of other diseases of the musculoskeletal system and connective tissue: Secondary | ICD-10-CM | POA: Insufficient documentation

## 2012-06-10 DIAGNOSIS — Z8659 Personal history of other mental and behavioral disorders: Secondary | ICD-10-CM | POA: Insufficient documentation

## 2012-06-10 DIAGNOSIS — Z86011 Personal history of benign neoplasm of the brain: Secondary | ICD-10-CM | POA: Insufficient documentation

## 2012-06-10 DIAGNOSIS — Z862 Personal history of diseases of the blood and blood-forming organs and certain disorders involving the immune mechanism: Secondary | ICD-10-CM | POA: Insufficient documentation

## 2012-06-10 DIAGNOSIS — Y929 Unspecified place or not applicable: Secondary | ICD-10-CM | POA: Insufficient documentation

## 2012-06-10 DIAGNOSIS — Z87440 Personal history of urinary (tract) infections: Secondary | ICD-10-CM | POA: Insufficient documentation

## 2012-06-10 DIAGNOSIS — Z8742 Personal history of other diseases of the female genital tract: Secondary | ICD-10-CM | POA: Insufficient documentation

## 2012-06-10 DIAGNOSIS — Z79899 Other long term (current) drug therapy: Secondary | ICD-10-CM | POA: Insufficient documentation

## 2012-06-10 DIAGNOSIS — M79643 Pain in unspecified hand: Secondary | ICD-10-CM

## 2012-06-10 DIAGNOSIS — Z8639 Personal history of other endocrine, nutritional and metabolic disease: Secondary | ICD-10-CM | POA: Insufficient documentation

## 2012-06-10 DIAGNOSIS — Z87891 Personal history of nicotine dependence: Secondary | ICD-10-CM | POA: Insufficient documentation

## 2012-06-10 DIAGNOSIS — X58XXXA Exposure to other specified factors, initial encounter: Secondary | ICD-10-CM | POA: Insufficient documentation

## 2012-06-10 DIAGNOSIS — Z87442 Personal history of urinary calculi: Secondary | ICD-10-CM | POA: Insufficient documentation

## 2012-06-10 DIAGNOSIS — E559 Vitamin D deficiency, unspecified: Secondary | ICD-10-CM | POA: Insufficient documentation

## 2012-06-10 DIAGNOSIS — Z91040 Latex allergy status: Secondary | ICD-10-CM | POA: Insufficient documentation

## 2012-06-10 NOTE — ED Provider Notes (Signed)
Jennifer [name] is a 56 y.o. female who had transient swelling in the dorsal first webspace associated with pain and redness. It spontaneously resolved after about one hour. She had been lifting, and moving prior to the incident. She denies blunt trauma, insect bite, or pre-existing. Similar condition. On exam, she has a normal appearing left hand with normal range of motion distal capillary refill and sensation. There is normal. Range of motion of the left arm. There are no evident joint deformities. The patient is anxious.  Assessment : Nonspecific transient swelling of the left hand. Most likely etiology is muscle spasm. I doubt arterial aneurysm, venous thrombosis, neuroma, fracture, ACS, or CVA.   Plan: Symptomatic treatment.   Medical screening examination/treatment/procedure(s) were conducted as a shared visit with non-physician practitioner(s) and myself.  I personally evaluated the patient during the encounter  Flint Melter, MD 06/10/12 [phone]

## 2012-06-10 NOTE — ED Notes (Signed)
No neuro deficits.

## 2012-06-10 NOTE — ED Provider Notes (Signed)
History     CSN: [id number]  Arrival date & time 06/10/12  1425   First MD Initiated Contact with Patient 06/10/12 1511      Chief Complaint  Patient presents with  . Hand Injury    (Consider location/radiation/quality/duration/timing/severity/associated sxs/prior treatment) HPI Comments: 56 year old female with a history of osteoarthritis, hypothyroidism, meningioma, and optic neuritis presents emergency department with the chief complaint of left hand pain.  Onset of symptoms began acutely today while putting things away in.  Patient states that she felt as severe 10 out of 10 pain located between her first finger web and then she noticed immediate swelling.  Patient states that the pain has improved when not moving hand however when gripping it is worsened.  She denies any trauma or injury, weakness, numbness or tingling, fever, night sweats, chills, change in skin color.  The history is provided by the patient.    Past Medical History  Diagnosis Date  . Hyperlipidemia   . Osteoarthritis   . Osteopenia   . IBS (irritable bowel syndrome)   . Vitamin D deficiency   . Hypothyroidism   . ADD (attention deficit disorder)   . Knee pain   . Optic neuritis 2009    Left  . Depression   . Anxiety   . Meningioma 2011    Near optic nerve  . Complication of anesthesia     Difficulty waking up  . H/O mitral valve prolapse   . H/O bladder infections   . Dyspareunia, female 2006  . Kidney infection   . Renal stones 2006  . Menopausal symptoms 2006  . Libido, decreased 2006  . PMB (postmenopausal bleeding) 2006  . Vaginal spotting 2006  . Excessive menses 2006  . Vaginal dryness 2007  . Vaginal atrophy 2007  . Urinary urgency 2009  . Hx: UTI (urinary tract infection) 2009  . H/O folliculitis 2010  . Allergy     latex    Past Surgical History  Procedure Date  . Knee surgery     Left for Fracture  . Wisdom tooth extraction   . Tonsillectomy     Family History    Problem Relation Age of Onset  . Colon cancer Neg Hx   . Heart disease Other   . Cancer Mother     meningioma    History  Substance Use Topics  . Smoking status: Former Smoker -- 1.0 packs/day for 8 years    Types: Cigarettes    Quit date: 07/31/1983  . Smokeless tobacco: Never Used  . Alcohol Use: 4.2 oz/week    7 Glasses of wine per week    OB History    Grav Para Term Preterm Abortions TAB SAB Ect Mult Living   1    1  1          Review of Systems  Constitutional: Negative for fever, diaphoresis and activity change.  HENT: Negative for congestion and neck pain.   Respiratory: Negative for cough.   Genitourinary: Negative for dysuria.  Musculoskeletal: Negative for myalgias.  Skin: Negative for color change and wound.  Neurological: Negative for headaches.  All other systems reviewed and are negative.    Allergies  Alprazolam; Epinephrine; Latex; Pristiq; Progesterone; and Propoxyphene-acetaminophen  Home Medications   Current Outpatient Rx  Name  Route  Sig  Dispense  Refill  . VITAMIN C 1000 MG PO TABS   Oral   Take 2,000 mg by mouth daily.           Marland Kitchen  VITAMIN D-3 5000 UNITS PO TABS   Oral   Take 1 tablet by mouth daily.          Marland Kitchen ESTRADIOL 2 MG VA RING   Vaginal   Place 2 mg vaginally every 3 (three) months. follow package directions   1 each   12   . OMEGA-3 FATTY ACIDS 1000 MG PO CAPS   Oral   Take 2 g by mouth daily.           . SUPER HIGH VITAMINS/MINERALS PO TABS   Oral   Take 1 tablet by mouth daily.           Marland Kitchen ALIGN 4 MG PO CAPS   Oral   Take 1 capsule by mouth daily.   14 capsule   0     BP 154/77  Pulse 74  Temp 98.4 F (36.9 C) (Oral)  Resp 11  SpO2 100%  Physical Exam  Nursing note and vitals reviewed. Constitutional: She is oriented to person, place, and time. She appears well-developed and well-nourished. No distress.  HENT:  Head: Normocephalic and atraumatic.  Eyes: Conjunctivae normal and EOM are  normal.  Neck: Normal range of motion.  Pulmonary/Chest: Effort normal.  Musculoskeletal: Normal range of motion.       Left: Normal thumb opposition, hyperextension, flexion extension.  Pain free range of motion of the digits and wrist.  Mild tenderness to palpation over the dorsal surface of first web space.  Neurological: She is alert and oriented to person, place, and time.       Strength 5/5 bilaterally.  Intact distal sensation.  Skin: Skin is warm and dry. No rash noted. She is not diaphoretic.       No edema, swelling, erythema or warmth.  No obvious deformity of hands in comparison to each other.  Psychiatric: She has a normal mood and affect. Her behavior is normal.    ED Course  Procedures (including critical care time)  Labs Reviewed - No data to display No results found.   1. Hand pain       MDM  Patient to ER with hand pain.  The scene with Dr. Effie Shy.  Patient extremely anxious on arrival.  At this time there does not appear to be any evidence of an acute emergency medical condition and the patient appears stable for discharge with appropriate outpatient follow up. Diagnosis of possible muscle spasm or ligament strain was discussed with patient who verbalizes understanding and is agreeable to discharge. Advised f-u w hand if symptoms worsen or persist. Pt case discussed with Dr. Effie Shy who agrees with my plan.          Jaci Carrel, [place] 06/10/12 1749

## 2012-06-10 NOTE — ED Notes (Addendum)
Pt reports that 1 hour ago she was cleaning a closet and her left hand began swelling. Pt reports pain upon movement. 4/10 radiates up left arm. Can wiggle all fingers. bil radial pulse. Pt reports "she feels like she cant clasp anything tight with hand". Pt denies trauma or injury.   Pt states "I have really good insurance and want the best care possible before I die". Pt thinks she has an aneurysm in her hand that could spread to her heart.

## 2012-06-11 NOTE — ED Provider Notes (Signed)
Medical screening examination/treatment/procedure(s) were performed by non-physician practitioner and as supervising physician I was immediately available for consultation/collaboration.  Nelline Lio L Stevi Hollinshead, MD 06/11/12 0002 

## 2012-07-16 ENCOUNTER — Telehealth: Payer: Self-pay | Admitting: Obstetrics and Gynecology

## 2012-07-17 ENCOUNTER — Encounter: Payer: Self-pay | Admitting: Obstetrics and Gynecology

## 2012-07-17 ENCOUNTER — Ambulatory Visit (INDEPENDENT_AMBULATORY_CARE_PROVIDER_SITE_OTHER): Payer: BC Managed Care – PPO | Admitting: Obstetrics and Gynecology

## 2012-07-17 VITALS — BP 108/58 | Ht 63.0 in | Wt 178.0 lb

## 2012-07-17 DIAGNOSIS — N952 Postmenopausal atrophic vaginitis: Secondary | ICD-10-CM

## 2012-07-17 NOTE — Progress Notes (Signed)
Subjective:    Jennifer [name] is a 56 y.o. female, G1P0010, who presents for estring insertion    The following portions of the patient's history were reviewed and updated as appropriate: allergies, current medications, past family history.    Objective:    BP 108/58  Ht 5\' 3"  (1.6 m)  Wt 178 lb (80.74 kg)  BMI 31.53 kg/m2  LMP 02/13/2007    Weight:  Wt Readings from Last 1 Encounters:  07/17/12 178 lb (80.74 kg)          BMI: Body mass index is 31.53 kg/(m^2).  General Appearance: Alert, appropriate appearance for age. No acute distress GYN exam: atrophy improving. Pt satisfied. Estring removed and new estring easily inserted   Assessment:    Vulvovaginal atrophy improving    Plan:    Estring every 12 weeks AEX 01/2013  Silverio Lay MD

## 2012-08-11 DIAGNOSIS — Z87828 Personal history of other (healed) physical injury and trauma: Secondary | ICD-10-CM | POA: Insufficient documentation

## 2012-08-21 ENCOUNTER — Emergency Department (HOSPITAL_COMMUNITY)
Admission: EM | Admit: 2012-08-21 | Discharge: 2012-08-22 | Disposition: A | Discharge status: Home / Self Care | Payer: BC Managed Care – PPO | Attending: Emergency Medicine | Admitting: Emergency Medicine

## 2012-08-21 ENCOUNTER — Encounter (HOSPITAL_COMMUNITY): Payer: Self-pay | Admitting: *Deleted

## 2012-08-21 DIAGNOSIS — Z8719 Personal history of other diseases of the digestive system: Secondary | ICD-10-CM | POA: Insufficient documentation

## 2012-08-21 DIAGNOSIS — Z87448 Personal history of other diseases of urinary system: Secondary | ICD-10-CM | POA: Insufficient documentation

## 2012-08-21 DIAGNOSIS — R109 Unspecified abdominal pain: Secondary | ICD-10-CM | POA: Insufficient documentation

## 2012-08-21 DIAGNOSIS — Z87442 Personal history of urinary calculi: Secondary | ICD-10-CM | POA: Insufficient documentation

## 2012-08-21 DIAGNOSIS — Z91040 Latex allergy status: Secondary | ICD-10-CM | POA: Insufficient documentation

## 2012-08-21 DIAGNOSIS — R5381 Other malaise: Secondary | ICD-10-CM | POA: Insufficient documentation

## 2012-08-21 DIAGNOSIS — Z8679 Personal history of other diseases of the circulatory system: Secondary | ICD-10-CM | POA: Insufficient documentation

## 2012-08-21 DIAGNOSIS — Z8742 Personal history of other diseases of the female genital tract: Secondary | ICD-10-CM | POA: Insufficient documentation

## 2012-08-21 DIAGNOSIS — Z8739 Personal history of other diseases of the musculoskeletal system and connective tissue: Secondary | ICD-10-CM | POA: Insufficient documentation

## 2012-08-21 DIAGNOSIS — R112 Nausea with vomiting, unspecified: Secondary | ICD-10-CM | POA: Insufficient documentation

## 2012-08-21 DIAGNOSIS — Z8669 Personal history of other diseases of the nervous system and sense organs: Secondary | ICD-10-CM | POA: Insufficient documentation

## 2012-08-21 DIAGNOSIS — R197 Diarrhea, unspecified: Secondary | ICD-10-CM | POA: Insufficient documentation

## 2012-08-21 DIAGNOSIS — Z79899 Other long term (current) drug therapy: Secondary | ICD-10-CM | POA: Insufficient documentation

## 2012-08-21 DIAGNOSIS — Z872 Personal history of diseases of the skin and subcutaneous tissue: Secondary | ICD-10-CM | POA: Insufficient documentation

## 2012-08-21 DIAGNOSIS — Z8639 Personal history of other endocrine, nutritional and metabolic disease: Secondary | ICD-10-CM | POA: Insufficient documentation

## 2012-08-21 DIAGNOSIS — E559 Vitamin D deficiency, unspecified: Secondary | ICD-10-CM | POA: Insufficient documentation

## 2012-08-21 DIAGNOSIS — Z8659 Personal history of other mental and behavioral disorders: Secondary | ICD-10-CM | POA: Insufficient documentation

## 2012-08-21 DIAGNOSIS — R42 Dizziness and giddiness: Secondary | ICD-10-CM | POA: Insufficient documentation

## 2012-08-21 DIAGNOSIS — Z862 Personal history of diseases of the blood and blood-forming organs and certain disorders involving the immune mechanism: Secondary | ICD-10-CM | POA: Insufficient documentation

## 2012-08-21 DIAGNOSIS — Z87440 Personal history of urinary (tract) infections: Secondary | ICD-10-CM | POA: Insufficient documentation

## 2012-08-21 DIAGNOSIS — Z87891 Personal history of nicotine dependence: Secondary | ICD-10-CM | POA: Insufficient documentation

## 2012-08-21 LAB — POCT I-STAT, CHEM 8
Calcium, Ion: 1.1 mmol/L — ABNORMAL LOW (ref 1.12–1.23)
Chloride: 106 mEq/L (ref 96–112)
Glucose, Bld: 118 mg/dL — ABNORMAL HIGH (ref 70–99)
HCT: 40 % (ref 36.0–46.0)
Hemoglobin: 13.6 g/dL (ref 12.0–15.0)

## 2012-08-21 LAB — CBC WITH DIFFERENTIAL/PLATELET
Basophils Absolute: 0 10*3/uL (ref 0.0–0.1)
Basophils Relative: 0 % (ref 0–1)
Eosinophils Absolute: 0 10*3/uL (ref 0.0–0.7)
HCT: 40.5 % (ref 36.0–46.0)
Hemoglobin: 13.6 g/dL (ref 12.0–15.0)
MCH: 30.7 pg (ref 26.0–34.0)
MCHC: 33.6 g/dL (ref 30.0–36.0)
Monocytes Absolute: 0.4 10*3/uL (ref 0.1–1.0)
Monocytes Relative: 5 % (ref 3–12)
Neutro Abs: 7.3 10*3/uL (ref 1.7–7.7)
Neutrophils Relative %: 89 % — ABNORMAL HIGH (ref 43–77)
RDW: 13.8 % (ref 11.5–15.5)

## 2012-08-21 MED ORDER — KETOROLAC TROMETHAMINE 30 MG/ML IJ SOLN
30.0000 mg | Freq: Once | INTRAMUSCULAR | Status: AC
  Administered 2012-08-21: 30 mg via INTRAVENOUS
  Filled 2012-08-21: qty 1

## 2012-08-21 MED ORDER — SODIUM CHLORIDE 0.9 % IV BOLUS (SEPSIS)
1000.0000 mL | Freq: Once | INTRAVENOUS | Status: AC
  Administered 2012-08-21: 1000 mL via INTRAVENOUS

## 2012-08-21 MED ORDER — ONDANSETRON HCL 4 MG/2ML IJ SOLN
4.0000 mg | Freq: Once | INTRAMUSCULAR | Status: AC
  Administered 2012-08-21: 4 mg via INTRAVENOUS
  Filled 2012-08-21: qty 2

## 2012-08-21 MED ORDER — GI COCKTAIL ~~LOC~~
30.0000 mL | Freq: Once | ORAL | Status: AC
  Administered 2012-08-22: 30 mL via ORAL
  Filled 2012-08-21: qty 30

## 2012-08-21 NOTE — ED Notes (Signed)
Verbal report given to Kelly T. RN.  

## 2012-08-21 NOTE — ED Notes (Signed)
AVW:[id number]<XB> Expected date:08/21/12<BR> Expected time: 7:41 PM<BR> Means of arrival:Ambulance<BR> Comments:<BR> Vomiting/hypotension

## 2012-08-21 NOTE — ED Notes (Signed)
Pt arrives by [facility], reports pt reports feeling dizzy, and started vomiting while out shopping.

## 2012-08-21 NOTE — ED Notes (Signed)
Pt reports "I'm feeling so much better than I was when I came." asking for additional fluids.

## 2012-08-21 NOTE — ED Provider Notes (Signed)
History     CSN: [id number]  Arrival date & time 08/21/12  1947   First MD Initiated Contact with Patient 08/21/12 2008      Chief Complaint  Patient presents with  . Emesis    (Consider location/radiation/quality/duration/timing/severity/associated sxs/prior treatment) HPI Comments: Patient was not feeling well prior to going to the store to buy crackers, Mylanta, coke for her symptoms on the recommendation of her pharmacist.   IN the store she suddenly became very nauseated, felt weak, and vomited a large amount X2 as well as had a loose BM.  She was transported by EMS and IV placed  Now she is feeling less weak, but still having nausea and the feeling that she will need to use the BR again   Patient is a 57 y.o. female presenting with vomiting. The history is provided by the patient.  Emesis  This is a new problem. The current episode started 3 to 5 hours ago. The emesis has an appearance of stomach contents. There has been no fever. Associated symptoms include abdominal pain and diarrhea. Pertinent negatives include no chills, no cough and no fever.    Past Medical History  Diagnosis Date  . Hyperlipidemia   . Osteoarthritis   . Osteopenia   . IBS (irritable bowel syndrome)   . Vitamin D deficiency   . Hypothyroidism   . ADD (attention deficit disorder)   . Knee pain   . Optic neuritis 2009    Left  . Depression   . Anxiety   . Meningioma 2011    Near optic nerve  . Complication of anesthesia     Difficulty waking up  . H/O mitral valve prolapse   . H/O bladder infections   . Dyspareunia, female 2006  . Kidney infection   . Renal stones 2006  . Menopausal symptoms 2006  . Libido, decreased 2006  . PMB (postmenopausal bleeding) 2006  . Vaginal spotting 2006  . Excessive menses 2006  . Vaginal dryness 2007  . Vaginal atrophy 2007  . Urinary urgency 2009  . Hx: UTI (urinary tract infection) 2009  . H/O folliculitis 2010  . Allergy     latex    Past Surgical  History  Procedure Date  . Knee surgery     Left for Fracture  . Wisdom tooth extraction   . Tonsillectomy     Family History  Problem Relation Age of Onset  . Colon cancer Neg Hx   . Heart disease Other   . Cancer Mother     meningioma    History  Substance Use Topics  . Smoking status: Former Smoker -- 1.0 packs/day for 8 years    Types: Cigarettes    Quit date: 07/31/1983  . Smokeless tobacco: Never Used  . Alcohol Use: 4.2 oz/week    7 Glasses of wine per week    OB History    Grav Para Term Preterm Abortions TAB SAB Ect Mult Living   1    1  1          Review of Systems  Constitutional: Positive for appetite change. Negative for fever and chills.  HENT: Negative.   Eyes: Negative for visual disturbance.  Respiratory: Negative for cough and shortness of breath.   Cardiovascular: Negative for chest pain and leg swelling.  Gastrointestinal: Positive for nausea, vomiting, abdominal pain and diarrhea.  Genitourinary: Negative.   Skin: Negative for wound.  Neurological: Positive for dizziness.    Allergies  Alprazolam; Epinephrine; Latex;  Pristiq; Progesterone; and Propoxyphene-acetaminophen  Home Medications   Current Outpatient Rx  Name  Route  Sig  Dispense  Refill  . VITAMIN D-3 5000 UNITS PO TABS   Oral   Take 1 tablet by mouth daily.          Marland Kitchen ESTRADIOL 2 MG VA RING   Vaginal   Place 2 mg vaginally every 3 (three) months. follow package directions   1 each   12   . OMEGA-3 FATTY ACIDS 1000 MG PO CAPS   Oral   Take 2 g by mouth daily.           . ADULT MULTIVITAMIN W/MINERALS CH   Oral   Take 1 tablet by mouth daily.         Marland Kitchen ALIGN 4 MG PO CAPS   Oral   Take 1 capsule by mouth daily.   14 capsule   0   . VITAMIN C 500 MG PO TABS   Oral   Take 1,000 mg by mouth daily.           BP 123/54  Pulse 85  Temp 98.1 F (36.7 C) (Oral)  Resp 18  SpO2 97%  LMP 02/13/2007  Physical Exam  Constitutional: She is oriented to  person, place, and time. She appears well-developed and well-nourished.  HENT:  Head: Normocephalic.  Eyes: Pupils are equal, round, and reactive to light.  Neck: Normal range of motion.  Cardiovascular: Normal rate.   Pulmonary/Chest: Effort normal and breath sounds normal.  Abdominal: Soft. She exhibits no distension. Bowel sounds are increased. There is no tenderness.  Musculoskeletal: Normal range of motion.  Neurological: She is alert and oriented to person, place, and time.  Skin: Skin is warm and dry.    ED Course  Procedures (including critical care time)  Labs Reviewed  CBC WITH DIFFERENTIAL - Abnormal; Notable for the following:    Neutrophils Relative 89 (*)     Lymphocytes Relative 6 (*)     Lymphs Abs 0.5 (*)     All other components within normal limits  POCT I-STAT, CHEM 8 - Abnormal; Notable for the following:    Glucose, Bld 118 (*)     Calcium, Ion 1.10 (*)     All other components within normal limits   No results found.   1. Nausea vomiting and diarrhea       MDM  After 2 liters of IV fluid patient has not had any more episodes or vomiting or diarrhea          Arman Filter, NP 08/22/12 0006  Arman Filter, NP 08/22/12 1610

## 2012-08-22 NOTE — ED Provider Notes (Signed)
Medical screening examination/treatment/procedure(s) were performed by non-physician practitioner and as supervising physician I was immediately available for consultation/collaboration.   Laray Anger, DO 08/22/12 1620

## 2012-09-02 ENCOUNTER — Ambulatory Visit: Payer: BC Managed Care – PPO | Admitting: Internal Medicine

## 2012-09-16 ENCOUNTER — Ambulatory Visit (INDEPENDENT_AMBULATORY_CARE_PROVIDER_SITE_OTHER): Payer: BC Managed Care – PPO | Admitting: Internal Medicine

## 2012-09-16 ENCOUNTER — Encounter: Payer: Self-pay | Admitting: Internal Medicine

## 2012-09-16 VITALS — BP 128/62 | HR 72 | Temp 98.0°F | Resp 16 | Wt 178.0 lb

## 2012-09-16 DIAGNOSIS — J329 Chronic sinusitis, unspecified: Secondary | ICD-10-CM

## 2012-09-16 DIAGNOSIS — F329 Major depressive disorder, single episode, unspecified: Secondary | ICD-10-CM

## 2012-09-16 DIAGNOSIS — E669 Obesity, unspecified: Secondary | ICD-10-CM

## 2012-09-16 DIAGNOSIS — H469 Unspecified optic neuritis: Secondary | ICD-10-CM

## 2012-09-16 DIAGNOSIS — F411 Generalized anxiety disorder: Secondary | ICD-10-CM

## 2012-09-16 DIAGNOSIS — F3289 Other specified depressive episodes: Secondary | ICD-10-CM

## 2012-09-16 DIAGNOSIS — E559 Vitamin D deficiency, unspecified: Secondary | ICD-10-CM

## 2012-09-16 MED ORDER — AMOXICILLIN 500 MG PO CAPS: 1000.0000 mg | ORAL_CAPSULE | Freq: Two times a day (BID) | ORAL | Status: DC

## 2012-09-16 MED ORDER — VENLAFAXINE HCL ER 37.5 MG PO CP24: 37.5000 mg | ORAL_CAPSULE | Freq: Every day | ORAL | Status: DC

## 2012-09-16 NOTE — Progress Notes (Signed)
   Subjective:    Sinusitis This is a new problem. The current episode started 1 to 4 weeks ago. The problem has been gradually worsening since onset. There has been no fever. Associated symptoms include chills, congestion and sinus pressure. Pertinent negatives include no coughing or headaches.   Amoxicillin x 1 d for dental work has helped  C/o stressful marriage; depressed mood; ACL problems  The patient is here to follow up on chronic insomnia, depression, anxiety, headaches and floaters symptoms controlled with medicines, diet  PT helped  She had a [place] bx in [place] non-guided  bx by an endocrinologist - not happy w/results - can't sing high notes  BP Readings from Last 3 Encounters:  09/16/12 128/62  08/22/12 143/69  07/17/12 108/58   Wt Readings from Last 3 Encounters:  09/16/12 178 lb (80.74 kg)  07/17/12 178 lb (80.74 kg)  04/18/12 172 lb (78.019 kg)      Review of Systems  Constitutional: Positive for chills and fatigue. Negative for activity change, appetite change and unexpected weight change.  HENT: Positive for congestion, rhinorrhea and sinus pressure. Negative for mouth sores.   Eyes: Negative for visual disturbance.  Respiratory: Negative for cough, chest tightness and wheezing.   Gastrointestinal: Negative for nausea and abdominal pain.  Genitourinary: Negative for frequency, difficulty urinating and vaginal pain.  Musculoskeletal: Negative for back pain and gait problem.  Skin: Negative for pallor and rash.  Neurological: Negative for dizziness, tremors, weakness, numbness and headaches.  Psychiatric/Behavioral: Positive for suicidal ideas and sleep disturbance. Negative for confusion. The patient is nervous/anxious.        Objective:   Physical Exam  Constitutional: She appears well-developed. No distress.  HENT:  Head: Normocephalic.  Right Ear: External ear normal.  Left Ear: External ear normal.  Nose: Nose normal.  Nasal d/c  Eyes:  Conjunctivae are normal. Pupils are equal, round, and reactive to light. Right eye exhibits no discharge. Left eye exhibits no discharge.  Neck: Normal range of motion. Neck supple. No JVD present. No tracheal deviation present. No thyromegaly present.  Cardiovascular: Normal rate, regular rhythm and normal heart sounds.   Pulmonary/Chest: No stridor. No respiratory distress. She has no wheezes.  Abdominal: Soft. Bowel sounds are normal. She exhibits no distension and no mass. There is no tenderness. There is no rebound and no guarding.  Musculoskeletal: She exhibits no edema and no tenderness.  Lymphadenopathy:    She has no cervical adenopathy.  Neurological: She displays normal reflexes. No cranial nerve deficit. She exhibits normal muscle tone. Coordination normal.  Skin: No rash noted. No erythema.  Psychiatric: She has a normal mood and affect. Her behavior is normal. Judgment and thought content normal.    Lab Results  Component Value Date   WBC 8.3 08/21/2012   HGB 13.6 08/21/2012   HCT 40.0 08/21/2012   PLT 284 08/21/2012   GLUCOSE 118* 08/21/2012   CHOL 287* 01/25/2011   TRIG 70.0 01/25/2011   HDL 96.50 01/25/2011   LDLDIRECT 202.5 01/25/2011   ALT 19 01/25/2011   AST 19 01/25/2011   NA 139 08/21/2012   K 3.7 08/21/2012   CL 106 08/21/2012   CREATININE 0.80 08/21/2012   BUN 18 08/21/2012   CO2 28 01/25/2011   TSH 1.92 02/20/2012   HGBA1C 5.7 04/10/2007         Assessment & Plan:

## 2012-09-16 NOTE — Assessment & Plan Note (Signed)
Continue with current prescription therapy as reflected on the Med list.  

## 2012-09-16 NOTE — Assessment & Plan Note (Signed)
We will try Effexor XR She is seeing a psychologist

## 2012-09-16 NOTE — Assessment & Plan Note (Signed)
Discussed.

## 2012-09-16 NOTE — Assessment & Plan Note (Signed)
Start Amoxicillin

## 2012-09-16 NOTE — Assessment & Plan Note (Signed)
No change F/u w/ophth

## 2012-09-16 NOTE — Assessment & Plan Note (Signed)
Wt Readings from Last 3 Encounters:  09/16/12 178 lb (80.74 kg)  07/17/12 178 lb (80.74 kg)  04/18/12 172 lb (78.019 kg)

## 2012-09-18 ENCOUNTER — Telehealth: Payer: Self-pay | Admitting: Internal Medicine

## 2012-09-18 NOTE — Telephone Encounter (Signed)
Pt was seen on Tuesday, not feeling any better. Request zpak be called in

## 2012-09-18 NOTE — Telephone Encounter (Signed)
Pt aware Dr Posey Rea is out of office, please call pt once something is sent in

## 2012-09-19 ENCOUNTER — Ambulatory Visit: Payer: BC Managed Care – PPO | Admitting: Internal Medicine

## 2012-09-19 MED ORDER — AZITHROMYCIN 250 MG PO TABS: ORAL_TABLET | ORAL | Status: DC

## 2012-09-19 NOTE — Addendum Note (Signed)
Addended by: Tresa Garter on: 09/19/2012 05:30 PM   Modules accepted: Orders

## 2012-09-19 NOTE — Telephone Encounter (Signed)
OK Zpac Done. Left a vm

## 2012-09-25 ENCOUNTER — Telehealth: Payer: Self-pay | Admitting: Internal Medicine

## 2012-09-25 NOTE — Telephone Encounter (Signed)
Patient Information:  Caller Name: Sabel  Phone: [phone]  Patient: Jennifer [name], Jennifer [name]  Gender: Female  DOB: Jul 14, 1956  Age: 57 Years  PCP: Plotnikov, Alex (Adults only)  Office Follow Up:  Does the office need to follow up with this patient?: No  Instructions For The Office: N/A  RN Note:  finished Zpack yesterday for sinus infection but she said she still feels miserable  She said drainage is not as dark in color as it was and may be decreasing some.  She just said she feels so congested  She has no fever.  No new symptoms and no worsening of sxs since office visit.  I advised her the antibiotic continues to work for 5 more days after completion  Gave her home care  Symptoms  Reason For Call & Symptoms: sinus congestion  Reviewed Health History In EMR: Yes  Reviewed Medications In EMR: Yes  Reviewed Allergies In EMR: Yes  Reviewed Surgeries / Procedures: Yes  Date of Onset of Symptoms: 09/20/2012  Guideline(s) Used:  Sinus Pain and Congestion  Disposition Per Guideline:   Home Care  Reason For Disposition Reached:   Sinus congestion as part of a cold, present < 10 days  Advice Given:  For a Runny Nose With Profuse Discharge:  Blowing the nose is all that is needed.  For a Stuffy Nose - Use Nasal Washes:  Introduction: Saline (salt water) nasal irrigation (nasal wash) is an effective and simple home remedy for treating stuffy nose and sinus congestion. The nose can be irrigated by pouring, spraying, or squirting salt water into the nose and then letting it run back out.  How it Helps: The salt water rinses out excess mucus, washes out any irritants (dust, allergens) that might be present, and moistens the nasal cavity.  For a Stuffy Nose - Use Nasal Washes:  Introduction: Saline (salt water) nasal irrigation (nasal wash) is an effective and simple home remedy for treating stuffy nose and sinus congestion. The nose can be irrigated by pouring, spraying, or squirting  salt water into the nose and then letting it run back out.  How it Helps: The salt water rinses out excess mucus, washes out any irritants (dust, allergens) that might be present, and moistens the nasal cavity.  How to Make Saline [facility] Water) Nasal Wash :  Add 1/2 tsp of table salt to 1 cup (8 oz; 240 ml) of warm water.  Nasal Decongestants for a Very Stuffy or Runny Nose:  Pseudoephedrine (Sudafed) is available OTC in pill form. Typical adult dosage is two 30 mg tablets every 6 hours.  Oxymetazoline Nasal Drops (Afrin) are available OTC. Clean out the nose before using. Spray each nostril once, wait one minute for absorption, and then spray a second time.  Hydration:  Drink plenty of liquids (6-8 glasses of water daily). If the air in your home is dry, use a cool mist humidifier  Call Back If:   You become worse.

## 2012-09-25 NOTE — Telephone Encounter (Signed)
Ok to ref Zpac if needed Thx

## 2012-09-26 NOTE — Telephone Encounter (Signed)
Left message for pt to callback office.  

## 2012-09-29 MED ORDER — AZITHROMYCIN 250 MG PO TABS: ORAL_TABLET | ORAL | Status: DC

## 2012-09-29 NOTE — Telephone Encounter (Signed)
Rx for Zpac sent to [facility], pt informed.

## 2012-12-09 ENCOUNTER — Other Ambulatory Visit: Payer: Self-pay | Admitting: Family Medicine

## 2012-12-09 DIAGNOSIS — Z139 Encounter for screening, unspecified: Secondary | ICD-10-CM

## 2012-12-09 DIAGNOSIS — E041 Nontoxic single thyroid nodule: Secondary | ICD-10-CM

## 2012-12-29 ENCOUNTER — Other Ambulatory Visit: Payer: BC Managed Care – PPO

## 2013-01-05 ENCOUNTER — Other Ambulatory Visit: Payer: BC Managed Care – PPO

## 2013-01-12 ENCOUNTER — Ambulatory Visit
Admission: RE | Admit: 2013-01-12 | Discharge: 2013-01-12 | Disposition: A | Discharge status: Home / Self Care | Payer: BC Managed Care – PPO | Source: Ambulatory Visit | Attending: Family Medicine | Admitting: Family Medicine

## 2013-01-12 DIAGNOSIS — E041 Nontoxic single thyroid nodule: Secondary | ICD-10-CM

## 2013-01-18 ENCOUNTER — Ambulatory Visit (INDEPENDENT_AMBULATORY_CARE_PROVIDER_SITE_OTHER): Payer: BC Managed Care – PPO | Admitting: Physician Assistant

## 2013-01-18 ENCOUNTER — Telehealth: Payer: Self-pay | Admitting: Family Medicine

## 2013-01-18 ENCOUNTER — Encounter: Payer: Self-pay | Admitting: Physician Assistant

## 2013-01-18 DIAGNOSIS — M25571 Pain in right ankle and joints of right foot: Secondary | ICD-10-CM

## 2013-01-18 DIAGNOSIS — T6391XA Toxic effect of contact with unspecified venomous animal, accidental (unintentional), initial encounter: Secondary | ICD-10-CM

## 2013-01-18 DIAGNOSIS — M25579 Pain in unspecified ankle and joints of unspecified foot: Secondary | ICD-10-CM

## 2013-01-18 MED ORDER — METHYLPREDNISOLONE SODIUM SUCC 125 MG IJ SOLR
125.0000 mg | Freq: Once | INTRAMUSCULAR | Status: AC
  Administered 2013-01-18: 125 mg via INTRAMUSCULAR

## 2013-01-18 MED ORDER — NON FORMULARY
10.0000 mg | Freq: Once | Status: AC
  Administered 2013-01-18: 10 mg via ORAL

## 2013-01-18 MED ORDER — HYDROXYZINE HCL 25 MG PO TABS: 12.5000 mg | ORAL_TABLET | Freq: Three times a day (TID) | ORAL | Status: DC | PRN

## 2013-01-18 MED ORDER — EPINEPHRINE 0.3 MG/0.3ML IJ SOAJ: 0.3000 mg | Freq: Once | INTRAMUSCULAR | Status: DC

## 2013-01-18 NOTE — Telephone Encounter (Signed)
Patient called because she is in excruciating pain from bee sting. She has never had pain like this before from bee sting. She was very upset, crying, and anxious. Spoke with Chelle and patient needs to elevate foot, ice, and take IBU or tylenol. Patient stated she cant take IBU due to GI upset and tylenol doesn't do anything. She is concerned that she is going to continue to hurt and we will be closed and there will be nothing she can do. Told her I would speak with Chelle and call her back but mean time get home elevate foot and ice. Discussed with Chelle again and wanted to see how she was doing and what pain medications she can tolerate. Spoke with patient and she was doing better. Her neighbor brought some meat tenderizer over and they made a mixture to put on sting. She has kept it elevated and iced. Told her Chelle was going to prescribe a few hydrocodone if she needed them. called in Hydrocodone 5/325 1 po q6 prn # 10 [facility].

## 2013-01-18 NOTE — Patient Instructions (Signed)
Keep the foot elevated to help reduce the swelling.  Ice application will also help reduce swelling. Use an OTC oral antihistamine (Claritin, Zyrtec or Allegra, or non-drowsy Benadryl) each morning. Take the hydroxyzine at bedtime, as it can cause drowsiness (you may take it during the day as long as you will not be driving or need to be alert for another reason).

## 2013-01-18 NOTE — Progress Notes (Signed)
  Subjective:    Patient ID: Jennifer [name], female    DOB: [birth year], 57 y.o.   MRN: [id number]  HPI This 57 y.o. female presents for evaluation of pain of the RIGHT foot after a bee sting through a sock on the top of her foot at 1:30 pm today.  Describes the pain as excruciating. Mildly short of breath, feels jittery and shaky.  Had been working in the garden, and hadn't eaten anything all day.  No symptoms involving the lips, tongue or throat. No visual disturbance.  She's had previous "severe" reactions to bee stings and ant bites before, but has never carried an epi-pen due to feeling jittery and like her heart is racing when she's received it.  Her husband is out of town (in [place]) and she's feeling overwhelmed and alone.  Past medical history, surgical history, family history, social history and problem list reviewed.   Review of Systems As above.    Objective:   Physical Exam Blood pressure 149/85, pulse 65, temperature 97.5 F (36.4 C), temperature source Oral, resp. rate 20, height 5' 2.25" (1.581 m), weight 177 lb 12.8 oz (80.65 kg), last menstrual period 02/13/2007, SpO2 99.00%. Body mass index is 32.27 kg/(m^2). Well-developed, well nourished WF who is awake, alert and oriented, in NAD. HEENT: [place]/AT, sclera and conjunctiva are clear.   Neck: supple, non-tender, no lymphadenopathy, thyromegaly. Heart: RRR, no murmur Lungs: normal effort, CTA Extremities: no cyanosis, clubbing. Mild edema of the RIGHT lateral foot at the site of the sting. Strong peripheral pulses, cap refill <2 seconds. Very tender 2-3 cm surrounding the sting. No stinger at the site. Skin: warm and dry without rash. Mild ecchymosis and edema of the RIGHT lateral foot, just distal to the lateral malleolus, at the site of the sting. Psychologic: good mood and appropriate affect, though anxious, normal speech and behavior.        Assessment & Plan:  Insect sting, initial encounter/Pain in joint,  ankle and foot, right - Plan: cetirizine 10 mg PO x 1 here, methylPREDNISolone sodium succinate (SOLU-MEDROL) 125 mg/2 mL injection 125 mg, EPINEPHrine (EPI-PEN) 0.3 mg/0.3 mL DEVI, hydrOXYzine (ATARAX/VISTARIL) 25 MG tablet  Patient Instructions  Keep the foot elevated to help reduce the swelling.  Ice application will also help reduce swelling. Use an OTC oral antihistamine (Claritin, Zyrtec or Allegra, or non-drowsy Benadryl) each morning. Take the hydroxyzine at bedtime, as it can cause drowsiness (you may take it during the day as long as you will not be driving or need to be alert for another reason).   Fernande Bras, PA-C Physician Assistant-Certified [facility]

## 2013-04-14 ENCOUNTER — Other Ambulatory Visit: Payer: Self-pay | Admitting: Family Medicine

## 2013-04-14 DIAGNOSIS — E049 Nontoxic goiter, unspecified: Secondary | ICD-10-CM

## 2013-06-29 ENCOUNTER — Telehealth: Payer: Self-pay | Admitting: Internal Medicine

## 2013-06-29 ENCOUNTER — Encounter: Payer: Self-pay | Admitting: Gastroenterology

## 2013-06-29 ENCOUNTER — Ambulatory Visit (INDEPENDENT_AMBULATORY_CARE_PROVIDER_SITE_OTHER): Payer: BC Managed Care – PPO | Admitting: Gastroenterology

## 2013-06-29 VITALS — BP 138/76 | HR 88 | Ht 62.25 in | Wt 175.0 lb

## 2013-06-29 DIAGNOSIS — K5900 Constipation, unspecified: Secondary | ICD-10-CM | POA: Insufficient documentation

## 2013-06-29 NOTE — Progress Notes (Signed)
06/29/2013 Jennifer [name] [id number] 1956-04-22   History of Present Illness:  This is a 57 year old female who is known to Dr. Juanda Chance for previous colonoscopy in October 2008 at which time the study was normal.  She had also been seen one other time more recently from bloating. Anyways, she comes in to our office today with complaints of constipation for the past 5 days. She reports that she travelled to [place] for Thanksgiving and the day before Thanksgiving she had several episodes of vomiting and some diarrhea. She went to the emergency department was given some IV fluids and Zofran. She has not had a bowel movement since that Wednesday, which has now been 5 days. She also reports eating a lot of food over this past weekend, none of that being healthy. She's used eating a lot of fruits and salads. She states that the only thing she has tried so far for her constipation was a dose of MiraLax just prior to coming to this visit today. Normally she has 2 bowel movements a day without any issues. She denies seeing any blood per rectum currently, does report a moderate amount of clear mucus.  She feels bloated and uncomfortable.  Current Medications, Allergies, Past Medical History, Past Surgical History, Family History and Social History were reviewed in [employer] record.   Physical Exam: BP 138/76  Pulse 88  Ht 5' 2.25" (1.581 m)  Wt 175 lb (79.379 kg)  BMI 31.76 kg/m2  LMP 02/13/2007 General: Well developed, white female in no acute distress Head: Normocephalic and atraumatic Eyes:  Sclerae anicteric, conjunctiva pink  Ears: Normal auditory acuity Lungs: Clear throughout to auscultation Heart: Regular rate and rhythm Abdomen: Soft, non-distended.  Normal bowel sounds.  Mild left sided TTP without R/R/G. Musculoskeletal: Symmetrical with no gross deformities  Extremities: No edema  Neurological: Alert oriented x 4, grossly nonfocal Psychological:  Alert and  cooperative. Normal mood and affect  Assessment and Recommendations: -Constipation:  No bowel movement in 5 days. This is most likely a result of dietary changes over the past several days with less fiber intake as well as dehydration from the episodes of vomiting that she experienced last week. Also probably side effect of Zofran as well. I have advised her to use 2 fleets enemas this evening followed by a bottle of magnesium citrate. If she does not have a bowel movement by tomorrow she needs to call our office first thing in the morning for possible abdominal x-ray and further recommendations.

## 2013-06-29 NOTE — Telephone Encounter (Signed)
Spoke with patient and she was int he ED in [place] last Wednesday for vomiting and diarrhea. She was given IVF and medications to stop symptoms. She states she has not had a bowel movement since then. She is bloating, burping and passing mucous when trying to have a bowel movement. She will try Miralax and repeat in 1 hour x 2. She wants to schedule OV for this afternoon also. Scheduled with Doug Sou, PA at 2:00 PM.

## 2013-06-29 NOTE — Progress Notes (Signed)
I agree. This is an isolated episode of constipation, no reason to suspect  Any pathology. Dietary indiscretions and sitting on the plane  likely causing constipation. I agree with OTC laxatives, wait at least 4-6 weeks before coming back for further work up.

## 2013-06-29 NOTE — Patient Instructions (Signed)
Please purchase the following medications over the counter and take as directed: 2 Fleets Enemas Magnesium Citrate 10 oz  Please have plenty clear liquids starting now. Around 5:00 pm, use 1 fleets enema into the rectum. At 6:00 pm, use the 2nd fleets enema.  At 6:30 pm, pour 10 oz magnesium citrate over ice and drink. Call tomorrow afternoon with an update on your condition.

## 2013-06-30 ENCOUNTER — Telehealth: Payer: Self-pay | Admitting: Gastroenterology

## 2013-06-30 DIAGNOSIS — K5900 Constipation, unspecified: Secondary | ICD-10-CM

## 2013-06-30 NOTE — Telephone Encounter (Signed)
Patient states she did not do the fleets enemas or Magnesium Citrate until this morning. She states the enemas did not work and she only had one watery BM with Magnesium citrate. She states she is distended and her stomach is sore now. She states she ate salad last night and today has only had liquids. Denies vomiting. Per Doug Sou, PA, patient to come in early AM for KUB. Left a message for patient to call me.

## 2013-07-01 NOTE — Telephone Encounter (Signed)
Left a message for patient to call me. 

## 2013-07-01 NOTE — Telephone Encounter (Signed)
Called This AM early about 0030 - having increasingly intense pain in abdomen and only some clear mucous dc from rectum as before. Pain in lower abdomen reported as severe. Some nausea, no vomiting. I explained that with what she is describing that proceeding to ED at [facility] or [facility] is best option for diagnostic evaluation at this time. She accepted this advice.

## 2013-07-01 NOTE — Telephone Encounter (Signed)
Unable to reach patient at cell number. Mail box is full. Left message again at home number.

## 2013-07-02 NOTE — Telephone Encounter (Signed)
Left message for patient to call me again. 

## 2013-07-03 NOTE — Telephone Encounter (Signed)
Patient has not returned my calls x 3 days.

## 2013-08-04 ENCOUNTER — Telehealth: Payer: Self-pay

## 2013-08-04 NOTE — Telephone Encounter (Signed)
The patient called hoping to speak with the [facility] regarding her medication.  Callback - [phone]

## 2013-08-04 NOTE — Telephone Encounter (Signed)
Left mess for patient to call back.  

## 2013-08-11 NOTE — Telephone Encounter (Signed)
Left mess for patient to call back.  

## 2013-08-12 DIAGNOSIS — K56699 Other intestinal obstruction unspecified as to partial versus complete obstruction: Secondary | ICD-10-CM | POA: Insufficient documentation

## 2013-08-21 NOTE — Telephone Encounter (Signed)
No returned call from pt x 08/04/13, closing phone note

## 2013-08-30 DIAGNOSIS — Z9049 Acquired absence of other specified parts of digestive tract: Secondary | ICD-10-CM

## 2013-08-30 HISTORY — DX: Acquired absence of other specified parts of digestive tract: Z90.49

## 2013-10-08 DIAGNOSIS — Z8719 Personal history of other diseases of the digestive system: Secondary | ICD-10-CM | POA: Insufficient documentation

## 2013-12-24 ENCOUNTER — Encounter: Payer: Self-pay | Admitting: Internal Medicine

## 2013-12-24 ENCOUNTER — Ambulatory Visit (INDEPENDENT_AMBULATORY_CARE_PROVIDER_SITE_OTHER): Payer: BC Managed Care – PPO | Admitting: Internal Medicine

## 2013-12-24 VITALS — BP 120/82 | HR 80 | Temp 97.4°F | Wt 158.0 lb

## 2013-12-24 DIAGNOSIS — S61219A Laceration without foreign body of unspecified finger without damage to nail, initial encounter: Secondary | ICD-10-CM | POA: Insufficient documentation

## 2013-12-24 DIAGNOSIS — Z9049 Acquired absence of other specified parts of digestive tract: Secondary | ICD-10-CM | POA: Insufficient documentation

## 2013-12-24 DIAGNOSIS — Z23 Encounter for immunization: Secondary | ICD-10-CM

## 2013-12-24 DIAGNOSIS — Z9889 Other specified postprocedural states: Secondary | ICD-10-CM

## 2013-12-24 DIAGNOSIS — S61209A Unspecified open wound of unspecified finger without damage to nail, initial encounter: Secondary | ICD-10-CM

## 2013-12-24 NOTE — Progress Notes (Signed)
Pre visit review using our clinic review tool, if applicable. No additional management support is needed unless otherwise documented below in the visit note. 

## 2013-12-24 NOTE — Assessment & Plan Note (Signed)
Tetanus shot Wound dressed

## 2013-12-24 NOTE — Assessment & Plan Note (Signed)
Discussed.

## 2013-12-24 NOTE — Progress Notes (Signed)
   Subjective:    HPI  C/o L 5th finger laceration  F/u stressful marriage; depressed mood; ACL problems  The patient is here to follow up on chronic insomnia, depression, anxiety, headaches and floaters symptoms controlled with medicines, diet  PT helped  She had a thyroid bx in [place] non-guided  bx by an endocrinologist - not happy w/results - can't sing high notes  BP Readings from Last 3 Encounters:  12/24/13 120/82  06/29/13 138/76  01/18/13 149/85   Wt Readings from Last 3 Encounters:  12/24/13 158 lb (71.668 kg)  06/29/13 175 lb (79.379 kg)  01/18/13 177 lb 12.8 oz (80.65 kg)      Review of Systems  Constitutional: Positive for fatigue. Negative for activity change, appetite change and unexpected weight change.  HENT: Positive for rhinorrhea. Negative for mouth sores.   Eyes: Negative for visual disturbance.  Respiratory: Negative for chest tightness and wheezing.   Gastrointestinal: Negative for nausea and abdominal pain.  Genitourinary: Negative for frequency, difficulty urinating and vaginal pain.  Musculoskeletal: Negative for back pain and gait problem.  Skin: Negative for pallor and rash.  Neurological: Negative for dizziness, tremors, weakness and numbness.  Psychiatric/Behavioral: Positive for suicidal ideas and sleep disturbance. Negative for confusion. The patient is nervous/anxious.        Objective:   Physical Exam  Constitutional: She appears well-developed. No distress.  HENT:  Head: Normocephalic.  Right Ear: External ear normal.  Left Ear: External ear normal.  Nose: Nose normal.  Nasal d/c  Eyes: Conjunctivae are normal. Pupils are equal, round, and reactive to light. Right eye exhibits no discharge. Left eye exhibits no discharge.  Neck: Normal range of motion. Neck supple. No JVD present. No tracheal deviation present. No thyromegaly present.  Cardiovascular: Normal rate, regular rhythm and normal heart sounds.   Pulmonary/Chest: No  stridor. No respiratory distress. She has no wheezes.  Abdominal: Soft. Bowel sounds are normal. She exhibits no distension and no mass. There is no tenderness. There is no rebound and no guarding.  Musculoskeletal: She exhibits no edema and no tenderness.  Lymphadenopathy:    She has no cervical adenopathy.  Neurological: She displays normal reflexes. No cranial nerve deficit. She exhibits normal muscle tone. Coordination normal.  Skin: No rash noted. No erythema.  Psychiatric: She has a normal mood and affect. Her behavior is normal. Judgment and thought content normal.  left 5th finger <1 cm laceration - closed  Lab Results  Component Value Date   WBC 8.3 08/21/2012   HGB 13.6 08/21/2012   HCT 40.0 08/21/2012   PLT 284 08/21/2012   GLUCOSE 118* 08/21/2012   CHOL 287* 01/25/2011   TRIG 70.0 01/25/2011   HDL 96.50 01/25/2011   LDLDIRECT 202.5 01/25/2011   ALT 19 01/25/2011   AST 19 01/25/2011   NA 139 08/21/2012   K 3.7 08/21/2012   CL 106 08/21/2012   CREATININE 0.80 08/21/2012   BUN 18 08/21/2012   CO2 28 01/25/2011   TSH 1.92 02/20/2012   HGBA1C 5.7 04/10/2007         Assessment & Plan:

## 2014-05-12 ENCOUNTER — Ambulatory Visit (INDEPENDENT_AMBULATORY_CARE_PROVIDER_SITE_OTHER): Payer: BC Managed Care – PPO | Admitting: Internal Medicine

## 2014-05-12 ENCOUNTER — Encounter: Payer: Self-pay | Admitting: Internal Medicine

## 2014-05-12 VITALS — BP 110/64 | HR 71 | Temp 98.3°F | Ht 63.0 in | Wt 161.0 lb

## 2014-05-12 DIAGNOSIS — F411 Generalized anxiety disorder: Secondary | ICD-10-CM

## 2014-05-12 DIAGNOSIS — M25572 Pain in left ankle and joints of left foot: Secondary | ICD-10-CM

## 2014-05-12 DIAGNOSIS — M25579 Pain in unspecified ankle and joints of unspecified foot: Secondary | ICD-10-CM | POA: Insufficient documentation

## 2014-05-12 DIAGNOSIS — E034 Atrophy of thyroid (acquired): Secondary | ICD-10-CM

## 2014-05-12 DIAGNOSIS — Z0000 Encounter for general adult medical examination without abnormal findings: Secondary | ICD-10-CM | POA: Insufficient documentation

## 2014-05-12 DIAGNOSIS — E038 Other specified hypothyroidism: Secondary | ICD-10-CM

## 2014-05-12 DIAGNOSIS — H5450 Low vision, one eye, unspecified eye: Secondary | ICD-10-CM

## 2014-05-12 DIAGNOSIS — E538 Deficiency of other specified B group vitamins: Secondary | ICD-10-CM

## 2014-05-12 MED ORDER — CYANOCOBALAMIN 1000 MCG/ML IJ SOLN
1000.0000 ug | Freq: Once | INTRAMUSCULAR | Status: AC
  Administered 2014-05-12: 1000 ug via INTRAMUSCULAR

## 2014-05-12 NOTE — Assessment & Plan Note (Signed)
No change 

## 2014-05-12 NOTE — Assessment & Plan Note (Signed)
2014 Dr Hal Neer - on injections Will inj today
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
## 2014-05-12 NOTE — Assessment & Plan Note (Signed)
Continue with current prescription therapy as reflected on the Med list.  

## 2014-05-12 NOTE — Assessment & Plan Note (Signed)
10/15 Left 4-5th in-between toe space is very tender - ?Morton's neuroma Will consult Dr Tamala Julian

## 2014-05-12 NOTE — Assessment & Plan Note (Addendum)
We discussed age appropriate health related issues, including available/recomended screening tests and vaccinations. We discussed a need for adhering to healthy diet and exercise. Labs/EKG were reviewed/ordered. All questions were answered. Outside labs renewed. Colon due in 2018 GYN

## 2014-05-12 NOTE — Progress Notes (Signed)
Pre visit review using our clinic review tool, if applicable. No additional management support is needed unless otherwise documented below in the visit note. 

## 2014-05-14 ENCOUNTER — Ambulatory Visit (INDEPENDENT_AMBULATORY_CARE_PROVIDER_SITE_OTHER): Payer: BC Managed Care – PPO | Admitting: Family Medicine

## 2014-05-14 ENCOUNTER — Encounter: Payer: Self-pay | Admitting: Family Medicine

## 2014-05-14 ENCOUNTER — Other Ambulatory Visit (INDEPENDENT_AMBULATORY_CARE_PROVIDER_SITE_OTHER): Payer: BC Managed Care – PPO

## 2014-05-14 VITALS — BP 98/64 | HR 78 | Ht 63.0 in | Wt 162.0 lb

## 2014-05-14 DIAGNOSIS — M79672 Pain in left foot: Secondary | ICD-10-CM

## 2014-05-14 DIAGNOSIS — M216X9 Other acquired deformities of unspecified foot: Secondary | ICD-10-CM

## 2014-05-14 DIAGNOSIS — G5762 Lesion of plantar nerve, left lower limb: Secondary | ICD-10-CM

## 2014-05-14 NOTE — Progress Notes (Signed)
  [facility] [address] [place], [place] [address] Phone: [phone] Subjective:    I'm seeing this patient by the request  of:  Walker Kehr, MD   CC: Left foot pain  [id number] Jennifer [name] is a 58 y.o. female coming in with complaint of left foot pain.  Patient states she has had this pain for multiple months but has gotten significantly worse over the course last several weeks. Patient states that when she walks it becomes more painful and has a dull throbbing aching pain mostly on the plantar aspect of the foot mostly on the lateral aspect of the foot. Patient rates the severity of 8/10. Patient feels like she is compensating because she's had multiple surgeries on her knee previously. Patient states that this is affecting her daily activities and also waking her up at night. Patient has not really tried any all modalities at this time. Does notice if she wears any shoes with heels it does seem to be worse.     Past medical history, social, surgical and family history all reviewed in electronic medical record.   Review of Systems: No headache, visual changes, nausea, vomiting, diarrhea, constipation, dizziness, abdominal pain, skin rash, fevers, chills, night sweats, weight loss, swollen lymph nodes, body aches, joint swelling, muscle aches, chest pain, shortness of breath, mood changes.   Objective Blood pressure 98/64, pulse 78, height 5\' 3"  (1.6 m), weight 162 lb (73.483 kg), last menstrual period 02/13/2007, SpO2 98.00%.  General: No apparent distress alert and oriented x3 mood and affect normal, dressed appropriately.  HEENT: Pupils equal, extraocular movements intact  Respiratory: Patient's speak in full sentences and does not appear short of breath  Cardiovascular: No lower extremity edema, non tender, no erythema  Skin: Warm dry intact with no signs of infection or rash on extremities or on axial skeleton.  Abdomen: Soft nontender    Neuro: Cranial nerves II through XII are intact, neurovascularly intact in all extremities with 2+ DTRs and 2+ pulses.  Lymph: No lymphadenopathy of posterior or anterior cervical chain or axillae bilaterally.  Gait normal with good balance and coordination.  MSK:  Non tender with full range of motion and good stability and symmetric strength and tone of shoulders, elbows, wrist, hip, and ankles bilaterally.  Foot exam shows the patient's does have loss of the transverse arch bilaterally left greater than right. Patient is also starting have hammering of the fourth and fifth toes on the left side. Patient is severely tender in the interspace between the third and fourth toes on the left foot. Neurovascularly intact distally with full range of motion of the ankle bilaterally.  limited muscular skeletal ultrasound was performed by Hulan Saas, M  Limited ultrasound of the left foot shows the patient does have a very large neuroma between the third and fourth digits. There is significant hypoechoic changes in this area.  Impression: Morton's neuroma  After verbal consent patient was prepped with alcohol swabs and with a 25-gauge 1 inch needle was injected under ultrasound guidance. Patient had injection of 0.5 cc of 0.5% Marcaine and 0.5 cc of Kenalog 40 mg/dL. Patient tolerated the procedure very well. Post injection instructions given.  Impression: Successful ultrasound-guided injection into a Morton's neuroma.    Impression and Recommendations:     This case required medical decision making of moderate complexity.

## 2014-05-14 NOTE — Patient Instructions (Signed)
Nice to meet you Ice bath 20 minutes daily  Pen[place]aid twice daily Spenco orthotics "total support" either online or [employer] Consider Dansko or Kalso for good shoes with a wide toe box and rigid sole shoe.  Exercises 3 times a week.  Come back in 3 weeks.

## 2014-05-14 NOTE — Assessment & Plan Note (Signed)
Patient was given to injection today after a long long discussion. Patient did have complete resolution of pain almost immediately. Patient discussed different changes I would like to make. Patient will get over-the-counter orthotics for her shoes, we discussed proper shoe width that we'll be helpful. We discussed an icing protocol and patient was given a trial of topical anti-inflammatories. patient will also try some over-the-counter medications and we'll see if this will be helpful. Patient will follow up again in 3 weeks. Continuing to have discomfort we could consider custom orthotics likely we may need to consider another injection.

## 2014-05-14 NOTE — Assessment & Plan Note (Signed)
This likely would cause patient to have a Morton's neuroma in the first place. Once again same plane.

## 2014-05-31 ENCOUNTER — Encounter: Payer: Self-pay | Admitting: Family Medicine

## 2014-06-07 ENCOUNTER — Ambulatory Visit (INDEPENDENT_AMBULATORY_CARE_PROVIDER_SITE_OTHER): Payer: BC Managed Care – PPO | Admitting: Family Medicine

## 2014-06-07 ENCOUNTER — Encounter: Payer: Self-pay | Admitting: Family Medicine

## 2014-06-07 VITALS — BP 120/62 | HR 78 | Ht 63.0 in | Wt 164.0 lb

## 2014-06-07 DIAGNOSIS — M25562 Pain in left knee: Secondary | ICD-10-CM

## 2014-06-07 DIAGNOSIS — G5762 Lesion of plantar nerve, left lower limb: Secondary | ICD-10-CM

## 2014-06-07 DIAGNOSIS — M7050 Other bursitis of knee, unspecified knee: Secondary | ICD-10-CM | POA: Insufficient documentation

## 2014-06-07 DIAGNOSIS — M7150 Other bursitis, not elsewhere classified, unspecified site: Secondary | ICD-10-CM

## 2014-06-07 NOTE — Progress Notes (Signed)
  [facility] [address] [place], [place] [address] Phone: [phone] Subjective:    I'm seeing this patient by the request  of:  Walker Kehr, MD   CC: Left foot pain  [id number] Jennifer [name] is a 58 y.o. female coming in with complaint of left foot pain.  Patient was seen previously and was diagnosed with a Morton's neuroma.  Patient did have a steroid injection and we discussed proper shoe choices, icing protocol as well as home exercise. Patient was also given topical anti-inflammatories we discussed proper shoe choices. Patient states foot pain is completely resolved at this time.  Patient is complaining a new pain on the left knee. Patient states it's on the medial aspect. Patient has been working with a different therapist without any significant success. Patient states that it seems to hurt after a lot of walking. States that first after sitting a long amount time can be uncomfortable. States that he can hurt to press on the area. Can have a dull throbbing ache at night. Does not stop her from any activity. Rates the severity is 6 out of 10.    Past medical history, social, surgical and family history all reviewed in electronic medical record.   Review of Systems: No headache, visual changes, nausea, vomiting, diarrhea, constipation, dizziness, abdominal pain, skin rash, fevers, chills, night sweats, weight loss, swollen lymph nodes, body aches, joint swelling, muscle aches, chest pain, shortness of breath, mood changes.   Objective Blood pressure 120/62, pulse 78, height 5\' 3"  (1.6 m), weight 164 lb (74.39 kg), last menstrual period 02/13/2007, SpO2 94 %.  General: No apparent distress alert and oriented x3 mood and affect normal, dressed appropriately.  HEENT: Pupils equal, extraocular movements intact  Respiratory: Patient's speak in full sentences and does not appear short of breath  Cardiovascular: No lower extremity edema, non tender,  no erythema  Skin: Warm dry intact with no signs of infection or rash on extremities or on axial skeleton.  Abdomen: Soft nontender  Neuro: Cranial nerves II through XII are intact, neurovascularly intact in all extremities with 2+ DTRs and 2+ pulses.  Lymph: No lymphadenopathy of posterior or anterior cervical chain or axillae bilaterally.  Gait normal with good balance and coordination.  MSK:  Non tender with full range of motion and good stability and symmetric strength and tone of shoulders, elbows, wrist, hip, and ankles bilaterally.  Foot exam shows the patient's does have loss of the transverse arch bilaterally left greater than right. Patient is also starting have hammering of the fourth and fifth toes on the left side. nontender on exam Neurovascularly intact distally with full range of motion of the ankle bilaterally. Knee:left Mild scarring from previous surgeriess. Mild diffuse tenderness but especially over the pes anserine bursa mild tight hamstrings ROM full in flexion and extension and lower leg rotation. Ligaments with solid consistent endpoints including ACL, PCL, LCL, MCL. Negative Mcmurray's, Apley's, and Thessalonian tests. Non painful patellar compression. Patellar glide without crepitus. Patellar and quadriceps tendons unremarkable. Hamstring and quadriceps strength is normal. Tight hamstrings as noted above     Impression and Recommendations:     This case required medical decision making of moderate complexity.

## 2014-06-07 NOTE — Assessment & Plan Note (Signed)
Improved significantly after the injection. We'll continue to monitor. Discussed the importance of wearing good shoes.

## 2014-06-07 NOTE — Assessment & Plan Note (Signed)
Patient does have some signs and symptoms of present anserine bursitis. Patient though does have a history of surgical intervention of this knee and I do think that further imaging is necessary. X-rays ordered today. Patient given home exercises that she will discuss with her therapist. We discussed the importance of icing as well as compression sleeves. Patient then is going to come back again in 3 weeks. If continuing to have pain we'll consider injection.  Spent greater than 25 minutes with patient face-to-face and had greater than 50% of counseling including as described above in assessment and plan.

## 2014-06-07 NOTE — Patient Instructions (Addendum)
Good to see you Jennifer [name] is still your friend New exercises alternating between calf and hamstrings.  Try the pennsaid twice daily.  I am so glad the foot is doing better.  Call insurance and ask about orthovisc and if you want we can start that when you want.  See me when you need me.

## 2014-06-30 ENCOUNTER — Telehealth: Payer: Self-pay | Admitting: *Deleted

## 2014-06-30 NOTE — Telephone Encounter (Signed)
Left msg on triage stating have a terrible cold needing appt or to see if md can rx something. Called pt back no answer LMOM will have to be seen b4 anything can be rx...Jennifer [name]

## 2014-07-02 ENCOUNTER — Ambulatory Visit: Payer: BC Managed Care – PPO | Admitting: Family Medicine

## 2014-07-03 ENCOUNTER — Ambulatory Visit (INDEPENDENT_AMBULATORY_CARE_PROVIDER_SITE_OTHER): Payer: BC Managed Care – PPO | Admitting: Family Medicine

## 2014-07-03 ENCOUNTER — Encounter: Payer: Self-pay | Admitting: Family Medicine

## 2014-07-03 VITALS — BP 140/76 | HR 76 | Temp 98.4°F | Wt 160.0 lb

## 2014-07-03 DIAGNOSIS — H6503 Acute serous otitis media, bilateral: Secondary | ICD-10-CM

## 2014-07-03 DIAGNOSIS — J018 Other acute sinusitis: Secondary | ICD-10-CM

## 2014-07-03 MED ORDER — AMOXICILLIN 875 MG PO TABS: 875.0000 mg | ORAL_TABLET | Freq: Two times a day (BID) | ORAL | Status: AC

## 2014-07-03 NOTE — Progress Notes (Signed)
OFFICE NOTE  07/03/2014  CC:  Chief Complaint  Patient presents with  . sinus pressure    pt c/o cold, cant smell or taste and has sinus drainage and pressure x1week.   HPI: Patient is a 58 y.o. Caucasian female who is here for "sinus pressure".  Nasal congestion/runny nose for over a week, ears hurt/pressure when flying on plane a couple of days ago.  Getting exhausted.  Mucous yellow, thick, lots of pressure over all sinus areas.  No fever.  Started coughing some today, feels some PND/tickle in throat.  Denies chest tightness/wheezing/SOB. Can't smell or taste anything.  Sleep is poor.  Tried advil decongestant-no help.    Pertinent PMH:  Past medical, surgical, social, and family history reviewed and no changes are noted since last office visit.  MEDS:  Outpatient Prescriptions Prior to Visit  Medication Sig Dispense Refill  . Cholecalciferol (VITAMIN D-3) 5000 UNITS TABS Take 1 tablet by mouth daily.     . fish oil-omega-3 fatty acids 1000 MG capsule Take 2 g by mouth daily.      . Multiple Vitamin (MULTIVITAMIN WITH MINERALS) TABS Take 1 tablet by mouth daily.    . Nutritional Supplements (DHEA PO) Take by mouth daily.    . polyethylene glycol (MIRALAX / GLYCOLAX) packet Take 17 g by mouth daily as needed.    . Thyroid 16.25 MG TABS Take 1-2 tablets by mouth every other day.    . vitamin C (ASCORBIC ACID) 500 MG tablet Take 1,000 mg by mouth daily.    Marland Kitchen EPINEPHrine (EPI-PEN) 0.3 mg/0.3 mL DEVI Inject 0.3 mLs (0.3 mg total) into the muscle once. (Patient not taking: Reported on 07/03/2014) 2 Device 1   No facility-administered medications prior to visit.    PE: Blood pressure 140/76, pulse 76, temperature 98.4 F (36.9 C), weight 160 lb (72.576 kg), last menstrual period 02/13/2007, SpO2 97 %. VS: noted--normal. Gen: alert, NAD, NONTOXIC APPEARING. HEENT: eyes without injection, drainage, or swelling.  Ears: EACs clear, TMs with normal light reflex and landmarks.  Nose: Clear  rhinorrhea, with some dried, crusty exudate adherent to mildly injected mucosa.  No purulent d/c.  No paranasal sinus TTP.  No facial swelling.  Throat and mouth without focal lesion.  No pharyngial swelling, erythema, or exudate.   Neck: supple, no LAD.   LUNGS: CTA bilat, nonlabored resps.   CV: RRR, no m/r/g. EXT: no c/c/e SKIN: no rash   IMPRESSION AND PLAN:  Acute infectious sinusitis with mild bilat AOM. Amoxil 875mg  bid x 10d. Saline nasal spray. Mucinex "plain".  An After Visit Summary was printed and given to the patient.  FOLLOW UP: prn

## 2014-07-03 NOTE — Patient Instructions (Signed)
Buy OTC generic saline nasal spray and spray 2-3 times in each nostril several times a day to irrigate and moisturize nasal passages.  Buy "plain" mucinex and take as directed on packaging.

## 2014-07-05 ENCOUNTER — Telehealth: Payer: Self-pay | Admitting: *Deleted

## 2014-07-05 NOTE — Telephone Encounter (Signed)
[facility] - Client [facility] Patient Name: Jennifer [name] Gender: Female DOB: August 12, 1955 Age: 58 Y 68 M 13 D Return Phone Number: [phone] (Primary), [phone] (Secondary) Address: City/State/Zip: [place]  [address] Client [facility] - Client Client Site [facility] - Day Physician Plotnikov, Alex Contact Type Call Call Type Triage / Clinical Relationship To Patient Self Return Phone Number [phone] (Primary) Chief Complaint Cold Symptom Initial Comment Caller states she has a terrible cold that will not go away. Wants to know what to take PreDisposition Did not know what to do Nurse Assessment Nurse: Mechele Dawley, RN, Amy Date/Time Eilene Ghazi Time): 07/02/2014 4:21:09 PM Confirm and document reason for call. If symptomatic, describe symptoms. ---CALLER STATES THAT SHE DOES NOT FEEL FEVERISH, SORE THROAT, A LOT OF NASAL SECRETIONS - PALE YELLOW - THICK, HEADACHE, EARS CONGESTED, EYES FEEL TIRED. Has the patient traveled out of the country within the last 30 days? ---Not Applicable Does the patient require triage? ---Yes Related visit to physician within the last 2 weeks? ---No Does the PT have any chronic conditions? (i.e. diabetes, asthma, etc.) ---No Guidelines Guideline Title Affirmed Question Affirmed Notes Nurse Date/Time Eilene Ghazi Time) Sinus Pain or Congestion Earache [place], RN, Amy 07/02/2014 4:25:02 PM Disp. Time Eilene Ghazi Time) Disposition Final User 07/02/2014 4:35:58 PM See Physician within 24 Hours Yes [place], Therapist, sports, Amy Caller Understands: Yes Disagree/Comply: Comply Care Advice Given Per Guideline SEE PHYSICIAN WITHIN 24 HOURS: FOR A STUFFY NOSE - USE NASAL WASHES: CARE ADVICE given per Sinus Pain or Congestion (Adult) guideline. CALL BACK IF: * You become worse. PLEASE NOTE: All timestamps contained within this report are represented as [place] Standard  Time. CONFIDENTIALTY NOTICE: This fax transmission is intended only for the addressee. It contains information that is legally privileged, confidential or otherwise protected from use or disclosure. If you are not the intended recipient, you are strictly prohibited from reviewing, disclosing, copying using or disseminating any of this information or taking any action in reliance on or regarding this information. If you have received this fax in error, please notify us immediately by telephone so that we can arrange for its return to [place]. Phone: [phone], Toll-Free: [phone], Fax: [phone] Page: 2 of 2 Call Id: [id number] Comments User: Susanne Borders, RN Date/Time Eilene Ghazi Time): 07/02/2014 4:35:43 PM CALLED ON BACK LINE AND GOT APPT SCHEDULED FOR 12 ON SATURDAY, PATIENT WAS VERY HAPPY TO HAVE AN APPT. Referrals REFERRED TO PCP OFFICE

## 2014-07-06 ENCOUNTER — Telehealth: Payer: Self-pay | Admitting: Internal Medicine

## 2014-07-06 NOTE — Telephone Encounter (Signed)
Pt wants to be worked in [employer], she will not be see any other MD, she states if Dr Alain Marion knows its her, he will work in as its the holidays and she does not have time to be sick. She was seen at Sat clinic and given abx that is not working, for sinus symptoms and cold. Requesting OV tomorrow with Dr Camila Li. Pls advise

## 2014-07-06 NOTE — Telephone Encounter (Signed)
Ok 1 pm tomorrow Thx

## 2014-07-07 ENCOUNTER — Ambulatory Visit (INDEPENDENT_AMBULATORY_CARE_PROVIDER_SITE_OTHER): Payer: BC Managed Care – PPO | Admitting: Internal Medicine

## 2014-07-07 ENCOUNTER — Encounter: Payer: Self-pay | Admitting: Internal Medicine

## 2014-07-07 VITALS — BP 124/82 | HR 67 | Temp 98.2°F | Ht 63.0 in | Wt 159.0 lb

## 2014-07-07 DIAGNOSIS — J0100 Acute maxillary sinusitis, unspecified: Secondary | ICD-10-CM

## 2014-07-07 MED ORDER — FLUCONAZOLE 150 MG PO TABS: 150.0000 mg | ORAL_TABLET | Freq: Once | ORAL | Status: DC

## 2014-07-07 MED ORDER — LEVOFLOXACIN 500 MG PO TABS: 500.0000 mg | ORAL_TABLET | Freq: Every day | ORAL | Status: DC

## 2014-07-07 NOTE — Progress Notes (Signed)
   Subjective:    Sinusitis    The patient is here for sinusitis (2 wks),  On Amox x5 d - not better    She had a thyroid bx in [place] non-guided  bx by an endocrinologist - not happy w/results - can't sing high notes  Seeing Dr Hal Neer, a naturopath in [place] for B12 shots and a hormonal cream - vit B12 def  BP Readings from Last 3 Encounters:  07/07/14 124/82  07/03/14 140/76  06/07/14 120/62   Wt Readings from Last 3 Encounters:  07/07/14 159 lb (72.122 kg)  07/03/14 160 lb (72.576 kg)  06/07/14 164 lb (74.39 kg)      Review of Systems  Constitutional: Positive for fatigue. Negative for activity change, appetite change and unexpected weight change.  HENT: Positive for rhinorrhea. Negative for mouth sores.   Eyes: Negative for visual disturbance.  Respiratory: Negative for chest tightness and wheezing.   Gastrointestinal: Negative for nausea and abdominal pain.  Genitourinary: Negative for frequency, difficulty urinating and vaginal pain.  Musculoskeletal: Negative for back pain and gait problem.  Skin: Negative for pallor and rash.  Neurological: Negative for dizziness, tremors, weakness and numbness.  Psychiatric/Behavioral: Positive for suicidal ideas and sleep disturbance. Negative for confusion. The patient is nervous/anxious.        Objective:   Physical Exam  Constitutional: She appears well-developed. No distress.  HENT:  Head: Normocephalic.  Right Ear: External ear normal.  Left Ear: External ear normal.  Nose: Nose normal.  Mouth/Throat: Oropharynx is clear and moist.  Eyes: Conjunctivae are normal. Pupils are equal, round, and reactive to light. Right eye exhibits no discharge. Left eye exhibits no discharge.  Neck: Normal range of motion. Neck supple. No JVD present. No tracheal deviation present. No thyromegaly present.  Cardiovascular: Normal rate, regular rhythm and normal heart sounds.   Pulmonary/Chest: No stridor. No respiratory distress. She  has no wheezes.  Abdominal: Soft. Bowel sounds are normal. She exhibits no distension and no mass. There is no tenderness. There is no rebound and no guarding.  Musculoskeletal: She exhibits no edema or tenderness.  Lymphadenopathy:    She has no cervical adenopathy.  Neurological: She displays normal reflexes. No cranial nerve deficit. She exhibits normal muscle tone. Coordination normal.  Skin: No rash noted. No erythema.  Psychiatric: She has a normal mood and affect. Her behavior is normal. Judgment and thought content normal.  swollen nasal mucosa  Lab Results  Component Value Date   WBC 8.3 08/21/2012   HGB 13.6 08/21/2012   HCT 40.0 08/21/2012   PLT 284 08/21/2012   GLUCOSE 118* 08/21/2012   CHOL 287* 01/25/2011   TRIG 70.0 01/25/2011   HDL 96.50 01/25/2011   LDLDIRECT 202.5 01/25/2011   ALT 19 01/25/2011   AST 19 01/25/2011   NA 139 08/21/2012   K 3.7 08/21/2012   CL 106 08/21/2012   CREATININE 0.80 08/21/2012   BUN 18 08/21/2012   CO2 28 01/25/2011   TSH 1.92 02/20/2012   HGBA1C 5.7 04/10/2007         Assessment & Plan:

## 2014-07-07 NOTE — Assessment & Plan Note (Signed)
12/15 refractory

## 2014-07-27 ENCOUNTER — Ambulatory Visit: Payer: BC Managed Care – PPO | Admitting: Internal Medicine

## 2014-09-02 ENCOUNTER — Other Ambulatory Visit: Payer: Self-pay | Admitting: Family Medicine

## 2014-09-02 DIAGNOSIS — E049 Nontoxic goiter, unspecified: Secondary | ICD-10-CM

## 2014-09-09 ENCOUNTER — Ambulatory Visit
Admission: RE | Admit: 2014-09-09 | Discharge: 2014-09-09 | Disposition: A | Discharge status: Home / Self Care | Payer: BLUE CROSS/BLUE SHIELD | Source: Ambulatory Visit | Attending: Family Medicine | Admitting: Family Medicine

## 2014-09-09 DIAGNOSIS — E049 Nontoxic goiter, unspecified: Secondary | ICD-10-CM

## 2014-10-15 ENCOUNTER — Telehealth: Payer: Self-pay | Admitting: Family Medicine

## 2014-10-15 NOTE — Telephone Encounter (Signed)
Pt called stated that she turn her ankle last night, she can not put weight on her ankle. Pt was wondering if Dr. Tamala Julian can work her in today or tomorrow. Please help

## 2014-10-15 NOTE — Telephone Encounter (Signed)
Pt request to be work on Monday for her foot and ankle pain. Please advise, no appt at all and pt does not want to go to the urgent care.

## 2014-10-15 NOTE — Telephone Encounter (Signed)
No, dr Tamala Julian is booked today & tomorrow. She can be seen at urgent care.

## 2014-10-15 NOTE — Telephone Encounter (Signed)
OK to work in on [employer]

## 2014-10-18 ENCOUNTER — Encounter: Payer: Self-pay | Admitting: Internal Medicine

## 2014-10-18 ENCOUNTER — Ambulatory Visit (INDEPENDENT_AMBULATORY_CARE_PROVIDER_SITE_OTHER)
Admission: RE | Admit: 2014-10-18 | Discharge: 2014-10-18 | Disposition: A | Discharge status: Home / Self Care | Payer: BLUE CROSS/BLUE SHIELD | Source: Ambulatory Visit | Attending: Internal Medicine | Admitting: Internal Medicine

## 2014-10-18 ENCOUNTER — Ambulatory Visit (INDEPENDENT_AMBULATORY_CARE_PROVIDER_SITE_OTHER): Payer: BLUE CROSS/BLUE SHIELD | Admitting: Internal Medicine

## 2014-10-18 VITALS — BP 120/80 | HR 69 | Temp 97.6°F | Wt 161.0 lb

## 2014-10-18 DIAGNOSIS — M79671 Pain in right foot: Secondary | ICD-10-CM | POA: Diagnosis not present

## 2014-10-18 NOTE — Assessment & Plan Note (Signed)
3/16 a twisting injury - r/o fx ACE X ray  Sports med consult

## 2014-10-18 NOTE — Patient Instructions (Signed)
Sprain A sprain is an injury to the soft tissue that connects adjacent bones across a joint (ligament), in which the ligament becomes stretched or torn. The purpose of ligaments is to prevent a joint from moving out side of its intended range of motion. The most common joints of the body to suffer from a sprain are the ankles, knees, and fingers. Sprains are classified into 3 categories: grade 1, grade 2, and grade 3. Grade 1 sprains cause pain, but the tendon is not lengthened. Grade 2 sprains include a lengthened ligament due to the ligament being stretched or partially ruptured. With grade 2 sprains there is still function, although the function may be diminished. Grade 3 sprains are marked by a complete tear of the ligament and the joint usually displays a loss of function.  SYMPTOMS   Pain and tenderness in the area of injury; severity varies with extent of injury.  Swelling of the affected joint (usually).  Redness or bruising in the area of injury, either immediately or several hours after the injury.  Loss of normal mobility of the injured joint. CAUSES  A sprain may occur as a secondary injury to a traumatic event, such as a fall or twisting injury. The ankle is susceptible to sprains because of it is a mechanically weak joint and is exposed during athletic events. RISK INCREASES WITH:  Trauma, especially with high-risk activities, such as sports with a lot of jumping, for knee and ankle sprains (basketball or volleyball); sports with a lot of pivoting motions, for knee sprains (skiing, soccer, or football); and contact sports.  Falls onto outstretched hands and wrists (wrist sprains).  Catching sports, such as water polo and baseball (finger sprains).  Poorly fitting and high-heeled shoes.  Poor field conditions.  Poor strength and flexibility.  Failure to warm-up properly before activity. PREVENTION  Warm up and stretch properly before activity.  Maintain physical  fitness:  Muscle strength.  Endurance and flexibility.  Cardiovascular fitness.  Wear properly fitted and padded protective equipment.  Wrap weak joints with support bandages before strenuous activity. PROGNOSIS  If treated properly, sprains usually heal in 2 to 8 weeks. Occasionally sprains require surgery for healing to occur. RELATED COMPLICATIONS  Permanent instability of a joint if the sprain is severe or if a ligament is repeatedly sprained.  Arthritis of the joint. TREATMENT Treatment involves ice and medicine to relieve pain and inflammation. Rest and immobilization of the injured joint is necessary for healing to occur. Strengthening and stretching exercises may be recommended after immobilization to regain strength and a full range of motion. For severe sprains surgery may be necessary to repair the injured ligament. MEDICATION  If pain medicine is necessary, then nonsteroidal anti-inflammatory medicines, such as aspirin and ibuprofen, or other minor pain relievers, such as acetaminophen, are often recommended.  Do not take pain medicine for 7 days before surgery.  Prescription pain relievers may be prescribed. Use only as directed and only as much as you need.  Cortisone injections are generally not advised for sprains. Cortisone may affect the healing of the ligament. HEAT AND COLD  Cold treatment (icing) relieves pain and reduces inflammation. Cold treatment should be applied for 10 to 15 minutes every 2 to 3 hours for inflammation and pain and immediately after any activity that aggravates your symptoms. Use ice packs or massage the area with a piece of ice (ice massage).  Heat treatment may be used prior to performing the stretching and strengthening activities prescribed by your  caregiver, physical therapist, or athletic trainer. Use a heat pack or soak the injury in warm water. SEEK MEDICAL CARE IF:  Symptoms get worse or do not improve in 2 to 6 weeks despite  treatment. Document Released: 07/16/2005 Document Revised: 10/08/2011 Document Reviewed: 10/28/2008 [facility] Patient Information 2015 [place], [place]. This information is not intended to replace advice given to you by your health care provider. Make sure you discuss any questions you have with your health care provider.

## 2014-10-18 NOTE — Progress Notes (Signed)
Pre visit review using our clinic review tool, if applicable. No additional management support is needed unless otherwise documented below in the visit note. 

## 2014-10-18 NOTE — Progress Notes (Signed)
   Subjective:    Foot Injury  The incident occurred 3 to 5 days ago (Twisted R foot on [place]). The injury mechanism was a twisting injury. The pain is present in the right foot. The quality of the pain is described as stabbing. The pain is severe. The pain has been constant since onset. Pertinent negatives include no numbness. She has tried ice and elevation for the symptoms. The treatment provided moderate relief.    BP Readings from Last 3 Encounters:  10/18/14 120/80  07/07/14 124/82  07/03/14 140/76   Wt Readings from Last 3 Encounters:  10/18/14 161 lb (73.029 kg)  07/07/14 159 lb (72.122 kg)  07/03/14 160 lb (72.576 kg)      Review of Systems  Constitutional: Positive for fatigue. Negative for activity change, appetite change and unexpected weight change.  HENT: Positive for rhinorrhea. Negative for mouth sores.   Eyes: Negative for visual disturbance.  Respiratory: Negative for chest tightness and wheezing.   Gastrointestinal: Negative for nausea and abdominal pain.  Genitourinary: Negative for frequency, difficulty urinating and vaginal pain.  Musculoskeletal: Negative for back pain and gait problem.  Skin: Negative for pallor and rash.  Neurological: Negative for dizziness, tremors, weakness and numbness.  Psychiatric/Behavioral: Positive for suicidal ideas and sleep disturbance. Negative for confusion. The patient is nervous/anxious.        Objective:   Physical Exam  Constitutional: She appears well-developed. No distress.  HENT:  Head: Normocephalic.  Right Ear: External ear normal.  Left Ear: External ear normal.  Nose: Nose normal.  Nasal d/c  Eyes: Conjunctivae are normal. Pupils are equal, round, and reactive to light. Right eye exhibits no discharge. Left eye exhibits no discharge.  Neck: Normal range of motion. Neck supple. No JVD present. No tracheal deviation present. No thyromegaly present.  Cardiovascular: Normal rate, regular rhythm and normal  heart sounds.   Pulmonary/Chest: No stridor. No respiratory distress. She has no wheezes.  Abdominal: Soft. Bowel sounds are normal. She exhibits no distension and no mass. There is no tenderness. There is no rebound and no guarding.  Musculoskeletal: She exhibits no edema or tenderness.  Lymphadenopathy:    She has no cervical adenopathy.  Neurological: She displays normal reflexes. No cranial nerve deficit. She exhibits normal muscle tone. Coordination normal.  Skin: No rash noted. No erythema.  Psychiatric: She has a normal mood and affect. Her behavior is normal. Judgment and thought content normal.  R lat foot with a hematoma  Lab Results  Component Value Date   WBC 8.3 08/21/2012   HGB 13.6 08/21/2012   HCT 40.0 08/21/2012   PLT 284 08/21/2012   GLUCOSE 118* 08/21/2012   CHOL 287* 01/25/2011   TRIG 70.0 01/25/2011   HDL 96.50 01/25/2011   LDLDIRECT 202.5 01/25/2011   ALT 19 01/25/2011   AST 19 01/25/2011   NA 139 08/21/2012   K 3.7 08/21/2012   CL 106 08/21/2012   CREATININE 0.80 08/21/2012   BUN 18 08/21/2012   CO2 28 01/25/2011   TSH 1.92 02/20/2012   HGBA1C 5.7 04/10/2007         Assessment & Plan:

## 2014-10-19 ENCOUNTER — Telehealth: Payer: Self-pay | Admitting: Internal Medicine

## 2014-10-19 NOTE — Telephone Encounter (Signed)
Per dr Tamala Julian, unfortunately he has no openings today. If she would like he can work her in tomorrow at [employer] or [place].

## 2014-10-19 NOTE — Telephone Encounter (Signed)
Dr Camila Li would like see if Dr Tamala Julian could possibly work her in today sometime?

## 2014-10-20 ENCOUNTER — Encounter: Payer: Self-pay | Admitting: Family Medicine

## 2014-10-20 ENCOUNTER — Ambulatory Visit (INDEPENDENT_AMBULATORY_CARE_PROVIDER_SITE_OTHER): Payer: BLUE CROSS/BLUE SHIELD | Admitting: Family Medicine

## 2014-10-20 ENCOUNTER — Other Ambulatory Visit (INDEPENDENT_AMBULATORY_CARE_PROVIDER_SITE_OTHER): Payer: BLUE CROSS/BLUE SHIELD

## 2014-10-20 VITALS — BP 102/70 | HR 70

## 2014-10-20 DIAGNOSIS — M79671 Pain in right foot: Secondary | ICD-10-CM | POA: Diagnosis not present

## 2014-10-20 NOTE — Progress Notes (Signed)
Pre visit review using our clinic review tool, if applicable. No additional management support is needed unless otherwise documented below in the visit note. 

## 2014-10-20 NOTE — Progress Notes (Signed)
[facility] [address], [place] [address] Phone: [phone] Subjective:    I'm seeing this patient by the request  of:  Walker Kehr, MD  CC: Right foot and ankle pain  [id number] Jennifer [name] is a 59 y.o. female coming in with complaint of right foot and ankle pain. Patient had a supinating injury on 10/13/2014.  Patient has been able to ambulate but discuss severe pain mostly on the lateral aspect of the foot. Patient states that it can be severe with certain movements. Patient denies any numbness or tingling. Patient states that it did swell initially and then has Improved over the course of time. Patient denies any radiation of pain going up any numbness. Patient rates the severity of pain though is 8 out of 10. Patient did see primary care provider who did put her in an Ace wrap.     patient had x-rays on March 42nd 2016. Patient's x-rays show her unremarkable.  Past medical history, social, surgical and family history all reviewed in electronic medical record.   Review of Systems: No headache, visual changes, nausea, vomiting, diarrhea, constipation, dizziness, abdominal pain, skin rash, fevers, chills, night sweats, weight loss, swollen lymph nodes, body aches, joint swelling, muscle aches, chest pain, shortness of breath, mood changes.   Objective Blood pressure 102/70, pulse 70, last menstrual period 02/13/2007, SpO2 97 %.  General: No apparent distress alert and oriented x3 mood and affect normal, dressed appropriately.  HEENT: Pupils equal, extraocular movements intact  Respiratory: Patient's speak in full sentences and does not appear short of breath  Cardiovascular: No lower extremity edema, non tender, no erythema  Skin: Warm dry intact with no signs of infection or rash on extremities or on axial skeleton.  Abdomen: Soft nontender  Neuro: Cranial nerves II through XII are intact, neurovascularly intact in all extremities  with 2+ DTRs and 2+ pulses.  Lymph: No lymphadenopathy of posterior or anterior cervical chain or axillae bilaterally.  Gait normal with good balance and coordination.  MSK:  Non tender with full range of motion and good stability and symmetric strength and tone of shoulders, elbows, wrist, hip, knee bilaterally Ankle: Right No visible erythema or swelling. Range of motion is full in all directions. Strength is 5/5 in all directions. Stable lateral and medial ligaments; squeeze test and kleiger test unremarkable; Talar dome nontender; No pain at base of 5th MT; No tenderness over cuboid; No tenderness over N spot or navicular prominence No tenderness on posterior aspects of lateral and medial malleolus Tender over the peroneal tendon was insertion over the fourth and fifth metatarsal. Negative tarsal tunnel tinel's Able to walk 4 steps.bilaterally.   MSK US performed of:  This study was ordered, performed, and interpreted by Charlann Boxer D.O.  Foot/Ankle:   All structures visualized.   Talar dome unremarkable  Ankle mortise without effusion. Peroneus longus and brevis tendons have hypoechoic changes and tendon sheath. She also has what it seems to be a positive Fleck sign at the insertion of the peroneal tendon attaches to the fourth metatarsal onset of the fifth metatarsal. Posterior tibialis, flexor hallucis longus, and flexor digitorum longus tendons unremarkable on long and transverse views without sheath effusions. Achilles tendon visualized along length of tendon and unremarkable on long and transverse views without sheath effusion. Anterior Talofibular Ligament and Calcaneofibular Ligaments unremarkable and intact. Deltoid Ligament unremarkable and intact. Plantar fascia intact and without effusion, normal thickness. No increased doppler signal, cap sign, or  thickening of tibial cortex. Power doppler signal normal.  IMPRESSION:  Patient does have a very small avulsion fracture  at the insertion of the peroneal tendon.     Impression and Recommendations:     This case required medical decision making of moderate complexity.

## 2014-10-20 NOTE — Assessment & Plan Note (Signed)
Patient does have an avulsion fracture at the insertion of the peroneal tendon. Patient was put in a Cam Walker due to the severity of pain at this time. We discussed icing regimen and home exercises. Patient given topical anti-inflammatories try. Patient and will follow-up with me in 2 weeks. At that time we will get her in a Aircast and advanced accordingly.

## 2014-10-20 NOTE — Patient Instructions (Signed)
Good to see you Ice 20 minutes 2 times daily. Usually after activity and before bed. Continue the vitamin D  A little bone fracture but like a bad sprain.  Wear boot with walking for next 2 weeks.  See me again in 2 weeks.

## 2014-11-02 ENCOUNTER — Encounter: Payer: Self-pay | Admitting: Family Medicine

## 2014-11-02 ENCOUNTER — Ambulatory Visit (INDEPENDENT_AMBULATORY_CARE_PROVIDER_SITE_OTHER): Payer: BLUE CROSS/BLUE SHIELD | Admitting: Family Medicine

## 2014-11-02 ENCOUNTER — Ambulatory Visit: Payer: BLUE CROSS/BLUE SHIELD | Admitting: Family Medicine

## 2014-11-02 VITALS — BP 122/72 | HR 70

## 2014-11-02 DIAGNOSIS — M25572 Pain in left ankle and joints of left foot: Secondary | ICD-10-CM | POA: Diagnosis not present

## 2014-11-02 NOTE — Assessment & Plan Note (Signed)
Discussed with patient at this time. Patient does have more over the peroneal tendon injury than likely, he'll fine. We discussed continuing the conservative therapy and being more rigid in the boot for another week. After that patient is going to be able to go to an [facility]. We discussed icing regimen. Patient will come back in 3 weeks and we will rescan to make sure patient is doing well.  Spent  25 minutes with patient face-to-face and had greater than 50% of counseling including as described above in assessment and plan.

## 2014-11-02 NOTE — Patient Instructions (Signed)
Good news the bone is good.  Wear boot for another week Ice still can help Continue the vitamin D Be patient We will call you when we get the brace See me again in 3 weeks.

## 2014-11-02 NOTE — Progress Notes (Signed)
  [name] [address] [place], [place] [address] Phone: [phone] Subjective:    CC: Right foot and ankle pain follow-up  CHY:IFOYDXAJOI Jennifer [name] is a 59 y.o. female coming in with complaint of right foot and ankle pain. Patient had a supinating injury on 10/13/2014.  Patient was found to have a very small avulsion fracture on ultrasound. Patient's x-rays were not significant. Patient was put in a Cam Walker and states that she has noticed some mild improvement. Patient states that she was noncompliant. Patient states that the pain is improving but very slowly. Patient cannot walk outside of her Cam Walker at this time. Patient states though that the swelling is significantly better.     patient had x-rays on March 42nd 2016. Patient's x-rays show her unremarkable.  Past medical history, social, surgical and family history all reviewed in electronic medical record.   Review of Systems: No headache, visual changes, nausea, vomiting, diarrhea, constipation, dizziness, abdominal pain, skin rash, fevers, chills, night sweats, weight loss, swollen lymph nodes, body aches, joint swelling, muscle aches, chest pain, shortness of breath, mood changes.   Objective Blood pressure 122/72, pulse 70, last menstrual period 02/13/2007, SpO2 98 %.  General: No apparent distress alert and oriented x3 mood and affect normal, dressed appropriately.  HEENT: Pupils equal, extraocular movements intact  Respiratory: Patient's speak in full sentences and does not appear short of breath  Cardiovascular: No lower extremity edema, non tender, no erythema  Skin: Warm dry intact with no signs of infection or rash on extremities or on axial skeleton.  Abdomen: Soft nontender  Neuro: Cranial nerves II through XII are intact, neurovascularly intact in all extremities with 2+ DTRs and 2+ pulses.  Lymph: No lymphadenopathy of posterior or anterior cervical chain or axillae  bilaterally.  Gait normal with good balance and coordination.  MSK:  Non tender with full range of motion and good stability and symmetric strength and tone of shoulders, elbows, wrist, hip, knee bilaterally Ankle: Right No visible erythema or swelling. Range of motion is full in all directions. Strength is 5/5 in all directions. Stable lateral and medial ligaments; squeeze test and kleiger test unremarkable; Talar dome nontender; No pain at base of 5th MT; No tenderness over cuboid; No tenderness over N spot or navicular prominence No tenderness on posterior aspects of lateral and medial malleolus Tender at the insertion of the fourth and fifth metatarsal bones. Negative tarsal tunnel tinel's Able to walk 4 steps.bilaterally.   MSK US performed of: Right ankle This study was ordered, performed, and interpreted by Charlann Boxer D.O.  Foot/Ankle:   All structures visualized.   Talar dome unremarkable  Ankle mortise without effusion. Peroneus longus and brevis tendons have hypoechoic changes and tendon sheath. Patient shows healing over the peroneal tendon.  Patient has significant decrease in hypoechoic changes. Posterior tibialis, flexor hallucis longus, and flexor digitorum longus tendons unremarkable on long and transverse views without sheath effusions. Achilles tendon visualized along length of tendon and unremarkable on long and transverse views without sheath effusion. Anterior Talofibular Ligament and Calcaneofibular Ligaments unremarkable and intact. Deltoid Ligament unremarkable and intact. Plantar fascia intact and without effusion, normal thickness. No increased doppler signal, cap sign, or thickening of tibial cortex. Power doppler signal normal.  IMPRESSION:  Healing of the peroneal tendon     Impression and Recommendations:     This case required medical decision making of moderate complexity.

## 2014-11-02 NOTE — Progress Notes (Signed)
Pre visit review using our clinic review tool, if applicable. No additional management support is needed unless otherwise documented below in the visit note. 

## 2014-11-10 ENCOUNTER — Ambulatory Visit: Payer: BC Managed Care – PPO | Admitting: Internal Medicine

## 2014-11-19 ENCOUNTER — Other Ambulatory Visit (INDEPENDENT_AMBULATORY_CARE_PROVIDER_SITE_OTHER): Payer: BLUE CROSS/BLUE SHIELD

## 2014-11-19 ENCOUNTER — Encounter: Payer: Self-pay | Admitting: Internal Medicine

## 2014-11-19 ENCOUNTER — Ambulatory Visit (INDEPENDENT_AMBULATORY_CARE_PROVIDER_SITE_OTHER): Payer: BLUE CROSS/BLUE SHIELD | Admitting: Internal Medicine

## 2014-11-19 VITALS — BP 120/74 | HR 74

## 2014-11-19 DIAGNOSIS — R5382 Chronic fatigue, unspecified: Secondary | ICD-10-CM

## 2014-11-19 DIAGNOSIS — G4700 Insomnia, unspecified: Secondary | ICD-10-CM

## 2014-11-19 DIAGNOSIS — R5383 Other fatigue: Secondary | ICD-10-CM | POA: Insufficient documentation

## 2014-11-19 DIAGNOSIS — M79671 Pain in right foot: Secondary | ICD-10-CM | POA: Diagnosis not present

## 2014-11-19 LAB — CBC WITH DIFFERENTIAL/PLATELET
BASOS PCT: 0.4 % (ref 0.0–3.0)
Basophils Absolute: 0 10*3/uL (ref 0.0–0.1)
EOS ABS: 0.1 10*3/uL (ref 0.0–0.7)
Eosinophils Relative: 2.1 % (ref 0.0–5.0)
HCT: 42.8 % (ref 36.0–46.0)
HEMOGLOBIN: 14.6 g/dL (ref 12.0–15.0)
LYMPHS ABS: 3.1 10*3/uL (ref 0.7–4.0)
Lymphocytes Relative: 45.6 % (ref 12.0–46.0)
MCHC: 34.1 g/dL (ref 30.0–36.0)
MCV: 91 fl (ref 78.0–100.0)
Monocytes Absolute: 0.6 10*3/uL (ref 0.1–1.0)
Monocytes Relative: 8.5 % (ref 3.0–12.0)
NEUTROS ABS: 2.9 10*3/uL (ref 1.4–7.7)
Neutrophils Relative %: 43.4 % (ref 43.0–77.0)
Platelets: 310 10*3/uL (ref 150.0–400.0)
RBC: 4.7 Mil/uL (ref 3.87–5.11)
RDW: 13.1 % (ref 11.5–15.5)
WBC: 6.7 10*3/uL (ref 4.0–10.5)

## 2014-11-19 LAB — BASIC METABOLIC PANEL
BUN: 17 mg/dL (ref 6–23)
CALCIUM: 10.1 mg/dL (ref 8.4–10.5)
CO2: 32 mEq/L (ref 19–32)
Chloride: 104 mEq/L (ref 96–112)
Creatinine, Ser: 0.81 mg/dL (ref 0.40–1.20)
GFR: 77.05 mL/min (ref >60.00)
GLUCOSE: 95 mg/dL (ref 70–99)
Potassium: 4.5 mEq/L (ref 3.5–5.1)
Sodium: 140 mEq/L (ref 135–145)

## 2014-11-19 LAB — VITAMIN B12: Vitamin B-12: 1239 pg/mL — ABNORMAL HIGH (ref 211–911)

## 2014-11-19 LAB — HEPATIC FUNCTION PANEL
ALT: 14 U/L (ref 0–35)
AST: 16 U/L (ref 0–37)
Albumin: 4.6 g/dL (ref 3.5–5.2)
Alkaline Phosphatase: 54 U/L (ref 39–117)
BILIRUBIN DIRECT: 0.1 mg/dL (ref 0.0–0.3)
BILIRUBIN TOTAL: 0.5 mg/dL (ref 0.2–1.2)
Total Protein: 7.1 g/dL (ref 6.0–8.3)

## 2014-11-19 LAB — TSH: TSH: 1.58 u[IU]/mL (ref 0.35–4.50)

## 2014-11-19 MED ORDER — SUVOREXANT 10 MG PO TABS: 10.0000 mg | ORAL_TABLET | Freq: Every evening | ORAL | Status: DC | PRN

## 2014-11-19 MED ORDER — SUVOREXANT 5 MG PO TABS: 5.0000 mg | ORAL_TABLET | Freq: Every day | ORAL | Status: DC

## 2014-11-19 NOTE — Assessment & Plan Note (Signed)
Will try Belsomra

## 2014-11-19 NOTE — Progress Notes (Signed)
   Subjective:    Foot Injury  The incident occurred more than 1 week ago (Twisted R foot on [place]). The injury mechanism was a twisting injury. The pain is present in the right foot. The quality of the pain is described as stabbing. The pain is moderate. The pain has been improving since onset. Pertinent negatives include no numbness. She has tried ice and elevation for the symptoms. The treatment provided moderate relief.  C/o fatigue x weeks  BP Readings from Last 3 Encounters:  11/19/14 120/74  11/02/14 122/72  10/20/14 102/70   Wt Readings from Last 3 Encounters:  10/18/14 161 lb (73.029 kg)  07/07/14 159 lb (72.122 kg)  07/03/14 160 lb (72.576 kg)      Review of Systems  Constitutional: Positive for fatigue. Negative for activity change, appetite change and unexpected weight change.  HENT: Positive for rhinorrhea. Negative for mouth sores.   Eyes: Negative for visual disturbance.  Respiratory: Negative for chest tightness and wheezing.   Gastrointestinal: Negative for nausea and abdominal pain.  Genitourinary: Negative for frequency, difficulty urinating and vaginal pain.  Musculoskeletal: Negative for back pain and gait problem.  Skin: Negative for pallor and rash.  Neurological: Negative for dizziness, tremors, weakness and numbness.  Psychiatric/Behavioral: Positive for suicidal ideas and sleep disturbance. Negative for confusion. The patient is nervous/anxious.        Objective:   Physical Exam  Constitutional: She appears well-developed. No distress.  HENT:  Head: Normocephalic.  Right Ear: External ear normal.  Left Ear: External ear normal.  Nose: Nose normal.  Nasal d/c  Eyes: Conjunctivae are normal. Pupils are equal, round, and reactive to light. Right eye exhibits no discharge. Left eye exhibits no discharge.  Neck: Normal range of motion. Neck supple. No JVD present. No tracheal deviation present. No thyromegaly present.  Cardiovascular: Normal rate,  regular rhythm and normal heart sounds.   Pulmonary/Chest: No stridor. No respiratory distress. She has no wheezes.  Abdominal: Soft. Bowel sounds are normal. She exhibits no distension and no mass. There is no tenderness. There is no rebound and no guarding.  Musculoskeletal: She exhibits no edema or tenderness.  Lymphadenopathy:    She has no cervical adenopathy.  Neurological: She displays normal reflexes. No cranial nerve deficit. She exhibits normal muscle tone. Coordination normal.  Skin: No rash noted. No erythema.  Psychiatric: She has a normal mood and affect. Her behavior is normal. Judgment and thought content normal.  R lat foot with a mild tenderness to palpation  Lab Results  Component Value Date   WBC 8.3 08/21/2012   HGB 13.6 08/21/2012   HCT 40.0 08/21/2012   PLT 284 08/21/2012   GLUCOSE 118* 08/21/2012   CHOL 287* 01/25/2011   TRIG 70.0 01/25/2011   HDL 96.50 01/25/2011   LDLDIRECT 202.5 01/25/2011   ALT 19 01/25/2011   AST 19 01/25/2011   NA 139 08/21/2012   K 3.7 08/21/2012   CL 106 08/21/2012   CREATININE 0.80 08/21/2012   BUN 18 08/21/2012   CO2 28 01/25/2011   TSH 1.92 02/20/2012   HGBA1C 5.7 04/10/2007         Assessment & Plan:

## 2014-11-19 NOTE — Assessment & Plan Note (Signed)
Better  

## 2014-11-19 NOTE — Progress Notes (Signed)
Pre visit review using our clinic review tool, if applicable. No additional management support is needed unless otherwise documented below in the visit note. 

## 2014-11-19 NOTE — Assessment & Plan Note (Addendum)
Chronic, multifactorial Labs

## 2014-11-24 ENCOUNTER — Ambulatory Visit (INDEPENDENT_AMBULATORY_CARE_PROVIDER_SITE_OTHER): Payer: BLUE CROSS/BLUE SHIELD | Admitting: Family Medicine

## 2014-11-24 ENCOUNTER — Other Ambulatory Visit (INDEPENDENT_AMBULATORY_CARE_PROVIDER_SITE_OTHER): Payer: BLUE CROSS/BLUE SHIELD

## 2014-11-24 ENCOUNTER — Encounter: Payer: Self-pay | Admitting: Family Medicine

## 2014-11-24 VITALS — BP 108/76 | HR 69 | Ht 63.0 in

## 2014-11-24 DIAGNOSIS — M25571 Pain in right ankle and joints of right foot: Secondary | ICD-10-CM

## 2014-11-24 DIAGNOSIS — M79671 Pain in right foot: Secondary | ICD-10-CM

## 2014-11-24 NOTE — Patient Instructions (Signed)
Good to see you Ice is your friend still continue the the boot or the shoes that feels good.  We will get MRI and I want to see you again in 1-2 days after MRI. I know it is frustrating but we will get to the bottom of it.

## 2014-11-24 NOTE — Assessment & Plan Note (Signed)
Patient continues to have difficulty with pain that seems to be at the proximal aspect of the fourth and fifth metatarsal but there is also potential for intra-articular ankle patient with patient is seen. On ultrasound there is no specific findings. On exam there is no sick specific findings except for mild discomfort. Patient is going to have an MRI due to the longevity and not making any improvement and unable to ambulate long distances. We will see if there is any intra-articular pathology to the ankle or if there is any nonhealing fracture that is not appreciated on the foot. Patient then will come back and see me 1-2 days after the MRI and we will discuss further.  Spent  25 minutes with patient face-to-face and had greater than 50% of counseling including as described above in assessment and plan.

## 2014-11-24 NOTE — Progress Notes (Signed)
Pre visit review using our clinic review tool, if applicable. No additional management support is needed unless otherwise documented below in the visit note. 

## 2014-11-24 NOTE — Progress Notes (Signed)
[facility] [place], [place] [address] Phone: [phone] Subjective:    CC: Right foot and ankle pain follow-up  IHK:[id number] Jennifer [name] is a 59 y.o. female coming in with complaint of right foot and ankle pain. Patient had a supinating injury on 10/13/2014.  Patient was found to have a very small avulsion fracture on ultrasound. Patient's x-rays were not significant. Patient was put in a Cam Walker and states that she has noticed some mild improvement. Patient states that she was noncompliant. Patient and was put in a postop boot which she still states that has some pain especially with side flexion of the ankle. Patient states this seems to be only with weightbearing. Patient rates the severity of pain a 9 out of 10. Denies any significant instability but patient is scared to increasing activity. Patient is concerned because she has not made any significant improvement from previous exams.     patient had x-rays on March 42nd 2016. Patient's x-rays show her unremarkable.  Past medical history, social, surgical and family history all reviewed in electronic medical record.   Review of Systems: No headache, visual changes, nausea, vomiting, diarrhea, constipation, dizziness, abdominal pain, skin rash, fevers, chills, night sweats, weight loss, swollen lymph nodes, body aches, joint swelling, muscle aches, chest pain, shortness of breath, mood changes.   Objective Blood pressure 108/76, pulse 69, height 5\' 3"  (1.6 m), last menstrual period 02/13/2007, SpO2 98 %.  General: No apparent distress alert and oriented x3 mood and affect normal, dressed appropriately.  HEENT: Pupils equal, extraocular movements intact  Respiratory: Patient's speak in full sentences and does not appear short of breath  Cardiovascular: No lower extremity edema, non tender, no erythema  Skin: Warm dry intact with no signs of infection or rash on extremities or on axial  skeleton.  Abdomen: Soft nontender  Neuro: Cranial nerves II through XII are intact, neurovascularly intact in all extremities with 2+ DTRs and 2+ pulses.  Lymph: No lymphadenopathy of posterior or anterior cervical chain or axillae bilaterally.  Gait normal with good balance and coordination.  MSK:  Non tender with full range of motion and good stability and symmetric strength and tone of shoulders, elbows, wrist, hip, knee bilaterally Ankle: Right No visible erythema or swelling. She actually does have decreased range of motion with dorsiflexion of the foot of 10 Range of motion is full in all directions. Mild weakness of 4 out of 5 compared to the contralateral side Stable lateral and medial ligaments; squeeze test and kleiger test unremarkable; Talar dome mainly tender which is new. No pain at base of 5th MT; No tenderness over cuboid; No tenderness over N spot or navicular prominence No tenderness on posterior aspects of lateral and medial malleolus Tender at the insertion of the fourth and fifth metatarsal bones still there but also some mild tenderness over the peroneal tendon.. Negative tarsal tunnel tinel's Able to walk 4 steps.bilaterally.   MSK US performed of: Right ankle This study was ordered, performed, and interpreted by Charlann Boxer D.O.  Foot/Ankle:   All structures visualized.   Talar dome mild hypoechoic changes but no bony abnormality. Ankle mortise without effusion. Peroneus longus and brevis tendons have hypoechoic changes and tendon sheath. Patient shows healing over the peroneal tendon.  Patient has significant decrease in hypoechoic changes. Posterior tibialis, flexor hallucis longus, and flexor digitorum longus tendons unremarkable on long and transverse views without sheath effusions. Achilles tendon visualized along length of tendon  and unremarkable on long and transverse views without sheath effusion. Anterior Talofibular Ligament and Calcaneofibular  Ligaments unremarkable and intact. Mild hypoechoic changes around the ATFL. Deltoid Ligament unremarkable and intact. Plantar fascia intact and without effusion, normal thickness. No increased doppler signal, cap sign, or thickening of tibial cortex. Power doppler signal normal.  IMPRESSION:  Healed peroneal tendon     Impression and Recommendations:     This case required medical decision making of moderate complexity.

## 2014-11-29 ENCOUNTER — Ambulatory Visit: Payer: BLUE CROSS/BLUE SHIELD | Admitting: Family Medicine

## 2014-12-05 ENCOUNTER — Other Ambulatory Visit: Payer: BLUE CROSS/BLUE SHIELD

## 2014-12-06 ENCOUNTER — Other Ambulatory Visit: Payer: BLUE CROSS/BLUE SHIELD

## 2014-12-06 ENCOUNTER — Ambulatory Visit
Admission: RE | Admit: 2014-12-06 | Discharge: 2014-12-06 | Disposition: A | Discharge status: Home / Self Care | Payer: BLUE CROSS/BLUE SHIELD | Source: Ambulatory Visit | Attending: Family Medicine | Admitting: Family Medicine

## 2014-12-06 DIAGNOSIS — M79671 Pain in right foot: Secondary | ICD-10-CM

## 2014-12-10 ENCOUNTER — Telehealth: Payer: Self-pay | Admitting: Family Medicine

## 2014-12-10 NOTE — Telephone Encounter (Signed)
Patient called wanting her MRI results she had on Monday. Please call her ASAP.

## 2014-12-11 NOTE — Telephone Encounter (Signed)
Attempted to return call numerous times and unable to get through.  Please call patient and tell her overall no signifcant problems.  It appears to be no stress fracture and only arthritis. If she is willing we can try an injection in the joint to calm down the area.  Patient is not in my chart so no other way to contact her.  We will attempt to call on Monday as soon as we can.

## 2014-12-13 NOTE — Telephone Encounter (Signed)
lmovm for pt to return call.  

## 2014-12-29 ENCOUNTER — Encounter: Payer: Self-pay | Admitting: Family Medicine

## 2014-12-29 ENCOUNTER — Ambulatory Visit (INDEPENDENT_AMBULATORY_CARE_PROVIDER_SITE_OTHER): Payer: BLUE CROSS/BLUE SHIELD | Admitting: Family Medicine

## 2014-12-29 ENCOUNTER — Other Ambulatory Visit: Payer: Self-pay | Admitting: *Deleted

## 2014-12-29 VITALS — BP 122/78 | HR 75 | Ht 63.0 in

## 2014-12-29 DIAGNOSIS — M19171 Post-traumatic osteoarthritis, right ankle and foot: Secondary | ICD-10-CM

## 2014-12-29 DIAGNOSIS — M19071 Primary osteoarthritis, right ankle and foot: Secondary | ICD-10-CM

## 2014-12-29 DIAGNOSIS — Z9119 Patient's noncompliance with other medical treatment and regimen: Secondary | ICD-10-CM | POA: Diagnosis not present

## 2014-12-29 DIAGNOSIS — Z91199 Patient's noncompliance with other medical treatment and regimen due to unspecified reason: Secondary | ICD-10-CM

## 2014-12-29 NOTE — Progress Notes (Signed)
[facility] [place], [place] [address] Phone: [phone] Subjective:    CC: Right foot and ankle pain follow-up  IOE:[id number] Jennifer [name] is a 59 y.o. female coming in with complaint of right foot and ankle pain. Patient had a supinating injury on 10/13/2014.  Patient was found to have a very small avulsion fracture on ultrasound. Patient's x-rays were not significant. Patient was put in a Cam Walker and was improving slowly but unfortunately then started having worsening symptoms again.   patient continued to have pain and didn't want an MRI. An MRI was ordered and we have reviewed previously. Patient only had some osteophytic changes of the first and second metatarsals as well as the middle cuneiform. Patient also had a very mild tibial posterior tendinitis. In very mild osteophytic changes of the third through fifth tarsometatarsal joints as well.  Reviewing patient's chart she went to another provider and was evaluated and they came up with the same diagnosis. Patient denied initially that she did see anybody else and then stated that she wanted to see if anything else can be done.  Patient states that the pain is still there. Patient states no matter if she goes to shoes, postop boot, or Cam Walker she continues to have the same pain. Patient states that it seems to be more diffuse over the foot and doesn't state that it seems to be anywhere specific. Patient continues to natural supplementation without any significant improvement. Patient still can do some daily activities. No new symptoms since previous exam.        Past medical history, social, surgical and family history all reviewed in electronic medical record.   Review of Systems: No headache, visual changes, nausea, vomiting, diarrhea, constipation, dizziness, abdominal pain, skin rash, fevers, chills, night sweats, weight loss, swollen lymph nodes, body aches, joint swelling,  muscle aches, chest pain, shortness of breath, mood changes.   Objective Height 5\' 3"  (1.6 m), last menstrual period 02/13/2007.  General: No apparent distress alert and oriented x3 mood and affect normal, dressed appropriately.  HEENT: Pupils equal, extraocular movements intact  Respiratory: Patient's speak in full sentences and does not appear short of breath  Cardiovascular: No lower extremity edema, non tender, no erythema  Skin: Warm dry intact with no signs of infection or rash on extremities or on axial skeleton.  Abdomen: Soft nontender  Neuro: Cranial nerves II through XII are intact, neurovascularly intact in all extremities with 2+ DTRs and 2+ pulses.  Lymph: No lymphadenopathy of posterior or anterior cervical chain or axillae bilaterally.  Gait normal with good balance and coordination.  MSK:  Non tender with full range of motion and good stability and symmetric strength and tone of shoulders, elbows, wrist, hip, knee bilaterally Ankle: Right No visible erythema or swelling. She actually does have decreased range of motion with dorsiflexion of the foot of 10 Range of motion is full in all directions. Full strength today which seems to be improvement. Stable lateral and medial ligaments; squeeze test and kleiger test unremarkable; Talar dome mainly tender which is new. No pain at base of 5th MT; No tenderness over cuboid; No tenderness over N spot or navicular prominence No tenderness on posterior aspects of lateral and medial malleolus Tender at the insertion of the fourth and fifth metatarsal bones still there but also some mild tenderness over the peroneal tendon.. Patient also tender to palpation over the first and second metatarsals proximally. Negative tarsal tunnel tinel's  Able to walk 4 steps.bilaterally.        Impression and Recommendations:     This case required medical decision making of moderate complexity.

## 2014-12-29 NOTE — Progress Notes (Signed)
Pre visit review using our clinic review tool, if applicable. No additional management support is needed unless otherwise documented below in the visit note. 

## 2014-12-29 NOTE — Assessment & Plan Note (Addendum)
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 Patient has healed completely from the previous avulsion fracture. MRI does not show any significant stress fracture but more pain still over the middle cuneiform. Discussed with patient at this time that I did not feel that I have anything else available to her at this time. Encourage her to attempt to try to do regular activity but if she is unable to tolerate that she should speak to a foot and ankle specialist. Patient once me to refer her to [facility]. I do feel that this could be beneficial.  She will be referred today. Discussed with patient as well with her chronic tardiness that this cannot be tolerated anymore. Patient will make sure when she is preferred that she will make it to her doctor's apartment  time.  Spent  25 minutes with patient face-to-face and had greater than 50% of counseling including as described above in assessment and plan.

## 2014-12-29 NOTE — Patient Instructions (Addendum)
I am sorry you are still hurting It looks as though the arthritis  I do not have anything else I can do Try regular shoes Would recommend Dr. Doran Durand if you want to discuss surgery I hope it gets better but I am not an option for this anymore.

## 2015-04-11 ENCOUNTER — Other Ambulatory Visit: Payer: Self-pay | Admitting: Family Medicine

## 2015-04-11 DIAGNOSIS — E049 Nontoxic goiter, unspecified: Secondary | ICD-10-CM

## 2015-04-14 ENCOUNTER — Ambulatory Visit
Admission: RE | Admit: 2015-04-14 | Discharge: 2015-04-14 | Disposition: A | Discharge status: Home / Self Care | Payer: BLUE CROSS/BLUE SHIELD | Source: Ambulatory Visit | Attending: Family Medicine | Admitting: Family Medicine

## 2015-04-14 DIAGNOSIS — E049 Nontoxic goiter, unspecified: Secondary | ICD-10-CM

## 2015-04-20 ENCOUNTER — Other Ambulatory Visit (HOSPITAL_COMMUNITY): Payer: Self-pay | Admitting: Family Medicine

## 2015-04-20 DIAGNOSIS — E042 Nontoxic multinodular goiter: Secondary | ICD-10-CM

## 2015-05-03 ENCOUNTER — Encounter (HOSPITAL_COMMUNITY)
Admission: RE | Admit: 2015-05-03 | Discharge: 2015-05-03 | Disposition: A | Discharge status: Home / Self Care | Payer: BLUE CROSS/BLUE SHIELD | Source: Ambulatory Visit | Attending: Family Medicine | Admitting: Family Medicine

## 2015-05-03 ENCOUNTER — Encounter (HOSPITAL_COMMUNITY): Payer: BLUE CROSS/BLUE SHIELD

## 2015-05-03 DIAGNOSIS — E042 Nontoxic multinodular goiter: Secondary | ICD-10-CM

## 2015-05-03 MED ORDER — TECHNETIUM TC 99M SESTAMIBI GENERIC - CARDIOLITE
25.0000 | Freq: Once | INTRAVENOUS | Status: AC | PRN
  Administered 2015-05-03: 25 via INTRAVENOUS

## 2015-05-24 ENCOUNTER — Ambulatory Visit: Payer: BLUE CROSS/BLUE SHIELD | Admitting: Internal Medicine

## 2015-06-16 DIAGNOSIS — M79621 Pain in right upper arm: Secondary | ICD-10-CM | POA: Insufficient documentation

## 2015-10-10 ENCOUNTER — Telehealth: Payer: Self-pay | Admitting: Internal Medicine

## 2015-10-10 NOTE — Telephone Encounter (Signed)
Pt call state she is experiencing pain around right side of her chest area,under breast, when she is inhale. Pt stated this is going on for couple days and wanted to be check out. Dr. Camila Li is book out until 10/24/15 and another PCP does not have anything open until Wednesday 10/12/15. Transfer pt to speak to the nurse line for evaluation and recommendation. Do we need to work her in or what do you recommend?

## 2015-10-10 NOTE — Telephone Encounter (Signed)
[facility]  Patient Name: Jennifer [name]  DOB: Mar 30, 1956    Initial Comment Caller says she has had chest pain behind right breast when she takes deep breaths in, and goes thru her back. Sx for 3, 4 days got stronger today    Nurse Assessment  Nurse: Wynetta Emery, RN, Baker Janus Date/Time Eilene Ghazi Time): 10/10/2015 4:03:52 PM  Confirm and document reason for call. If symptomatic, describe symptoms. You must click the next button to save text entered. ---Jennifer [name] takes a deep breath and has pain in chest behind right breast radiating to back under shoulder blade. onset 3 to 4 days ago progressively getting worse  Has the patient traveled out of the country within the last 30 days? ---No  Does the patient have any new or worsening symptoms? ---Yes  Will a triage be completed? ---Yes  Related visit to physician within the last 2 weeks? ---No  Does the PT have any chronic conditions? (i.e. diabetes, asthma, etc.) ---Unknown  Is this a behavioral health or substance abuse call? ---No     Guidelines    Guideline Title Affirmed Question Affirmed Notes  Chest Pain Difficulty breathing    Final Disposition User   Go to ED Now Wynetta Emery, RN, Baker Janus    Comments  NOTE: She feels she does not want to sit in ED and have another MD she does not know her take care of her makes her anxious she wants an appt advised that the best place for her at this time She wants to see MD she is getting upset just thinking of going to ED and have someone strange take care of her. She is almost panic. Talked with her a little more and advised to get off phone and take small steady breaths to calm self down. Someone will be calling her back.   Referrals  GO TO FACILITY REFUSED   Disagree/Comply: Disagree  Disagree/Comply Reason: Disagree with instructions

## 2015-10-10 NOTE — Telephone Encounter (Signed)
[facility]  Patient Name: Jennifer [name]  DOB: [birth year]    Initial Comment Caller says she has had chest pain behind right breast when she takes deep breaths in, and goes thru her back. Sx for 3, 4 days got stronger today    Nurse Assessment  Nurse: Wynetta Emery, RN, Baker Janus Date/Time Eilene Ghazi Time): 10/10/2015 4:03:52 PM  Confirm and document reason for call. If symptomatic, describe symptoms. You must click the next button to save text entered. ---Ashlye takes a deep breath and has pain in chest behind right breast radiating to back under shoulder blade. onset 3 to 4 days ago progressively getting worse  Has the patient traveled out of the country within the last 30 days? ---No  Does the patient have any new or worsening symptoms? ---Yes  Will a triage be completed? ---Yes  Related visit to physician within the last 2 weeks? ---No  Does the PT have any chronic conditions? (i.e. diabetes, asthma, etc.) ---Unknown  Is this a behavioral health or substance abuse call? ---No     Guidelines    Guideline Title Affirmed Question Affirmed Notes  Chest Pain Difficulty breathing    Final Disposition User   Go to ED Now Wynetta Emery, RN, Baker Janus    Comments  NOTE: She feels she does not want to sit in ED and have another MD she does not know her take care of her makes her anxious she wants an appt advised that the best place for her at this time She wants to see MD she is getting upset just thinking of going to ED and have someone strange take care of her. She is almost panic. Talked with her a little more and advised to get off phone and take small steady breaths to calm self down. Someone will be calling her back.   Referrals  GO TO FACILITY REFUSED   Disagree/Comply: Disagree

## 2015-10-11 NOTE — Telephone Encounter (Signed)
Pt called back and stated that she went the ED at [facility]. They did blood work and came back negative. They dx: acute costa-condritis

## 2015-10-11 NOTE — Telephone Encounter (Signed)
Left vm on home phone to call our office back, can not leave vm on cell phone.

## 2015-10-11 NOTE — Telephone Encounter (Signed)
LVM for pt to call back as soon as possible.   RE: below call from yesterday's [facility]. Pt mail box is full.

## 2015-10-11 NOTE — Telephone Encounter (Signed)
I called pt as well on home and mobile numbers. Unable to leave vm. Will try again later.

## 2015-10-11 NOTE — Telephone Encounter (Signed)
Tried pt mobile but voicemail is full.   Was able to leave message for pt at home number.

## 2015-10-11 NOTE — Telephone Encounter (Signed)
If pain is severe - see any provider or go to ER ?4:30 today if willing to wait Thx

## 2015-10-11 NOTE — Telephone Encounter (Signed)
Darci Needle called back. Informed that we are trying to get in touch with pt to schedule for today if she is still in severe pain.

## 2015-10-11 NOTE — Telephone Encounter (Signed)
Lm for spouse home (same as pt) and mobile. Lm with Darci Needle (pt contact list) mobile for pt to call back.

## 2015-11-08 LAB — HM DEXA SCAN

## 2015-11-08 LAB — HM MAMMOGRAPHY

## 2015-11-10 ENCOUNTER — Encounter: Payer: Self-pay | Admitting: Internal Medicine

## 2015-11-16 DIAGNOSIS — N62 Hypertrophy of breast: Secondary | ICD-10-CM | POA: Insufficient documentation

## 2015-11-21 ENCOUNTER — Encounter: Payer: Self-pay | Admitting: Internal Medicine

## 2016-01-30 DIAGNOSIS — R51 Headache: Secondary | ICD-10-CM | POA: Diagnosis not present

## 2016-01-30 DIAGNOSIS — H2513 Age-related nuclear cataract, bilateral: Secondary | ICD-10-CM | POA: Diagnosis not present

## 2016-01-30 DIAGNOSIS — Z9181 History of falling: Secondary | ICD-10-CM | POA: Diagnosis not present

## 2016-01-30 DIAGNOSIS — H43811 Vitreous degeneration, right eye: Secondary | ICD-10-CM | POA: Diagnosis not present

## 2016-01-30 DIAGNOSIS — H47292 Other optic atrophy, left eye: Secondary | ICD-10-CM | POA: Diagnosis not present

## 2016-01-30 DIAGNOSIS — H5442 Blindness, left eye, normal vision right eye: Secondary | ICD-10-CM | POA: Diagnosis not present

## 2016-01-30 DIAGNOSIS — Z9889 Other specified postprocedural states: Secondary | ICD-10-CM | POA: Diagnosis not present

## 2016-01-30 DIAGNOSIS — Z9089 Acquired absence of other organs: Secondary | ICD-10-CM | POA: Diagnosis not present

## 2016-01-30 DIAGNOSIS — H5340 Unspecified visual field defects: Secondary | ICD-10-CM | POA: Diagnosis not present

## 2016-01-30 DIAGNOSIS — D329 Benign neoplasm of meninges, unspecified: Secondary | ICD-10-CM | POA: Diagnosis not present

## 2016-01-30 DIAGNOSIS — H468 Other optic neuritis: Secondary | ICD-10-CM | POA: Diagnosis not present

## 2016-02-06 DIAGNOSIS — M25571 Pain in right ankle and joints of right foot: Secondary | ICD-10-CM | POA: Diagnosis not present

## 2016-02-06 DIAGNOSIS — M25569 Pain in unspecified knee: Secondary | ICD-10-CM | POA: Diagnosis not present

## 2016-02-08 DIAGNOSIS — M25569 Pain in unspecified knee: Secondary | ICD-10-CM | POA: Diagnosis not present

## 2016-02-08 DIAGNOSIS — M25571 Pain in right ankle and joints of right foot: Secondary | ICD-10-CM | POA: Diagnosis not present

## 2016-02-20 DIAGNOSIS — N62 Hypertrophy of breast: Secondary | ICD-10-CM | POA: Diagnosis not present

## 2016-02-21 DIAGNOSIS — M25571 Pain in right ankle and joints of right foot: Secondary | ICD-10-CM | POA: Diagnosis not present

## 2016-02-21 DIAGNOSIS — M25569 Pain in unspecified knee: Secondary | ICD-10-CM | POA: Diagnosis not present

## 2016-02-22 DIAGNOSIS — M25571 Pain in right ankle and joints of right foot: Secondary | ICD-10-CM | POA: Diagnosis not present

## 2016-02-22 DIAGNOSIS — M25569 Pain in unspecified knee: Secondary | ICD-10-CM | POA: Diagnosis not present

## 2016-02-27 DIAGNOSIS — M25571 Pain in right ankle and joints of right foot: Secondary | ICD-10-CM | POA: Diagnosis not present

## 2016-02-27 DIAGNOSIS — M25569 Pain in unspecified knee: Secondary | ICD-10-CM | POA: Diagnosis not present

## 2016-03-05 DIAGNOSIS — M25571 Pain in right ankle and joints of right foot: Secondary | ICD-10-CM | POA: Diagnosis not present

## 2016-03-05 DIAGNOSIS — M25569 Pain in unspecified knee: Secondary | ICD-10-CM | POA: Diagnosis not present

## 2016-03-06 DIAGNOSIS — M25571 Pain in right ankle and joints of right foot: Secondary | ICD-10-CM | POA: Diagnosis not present

## 2016-03-06 DIAGNOSIS — M1732 Unilateral post-traumatic osteoarthritis, left knee: Secondary | ICD-10-CM | POA: Diagnosis not present

## 2016-03-06 DIAGNOSIS — M25569 Pain in unspecified knee: Secondary | ICD-10-CM | POA: Diagnosis not present

## 2016-03-07 DIAGNOSIS — M25571 Pain in right ankle and joints of right foot: Secondary | ICD-10-CM | POA: Diagnosis not present

## 2016-03-07 DIAGNOSIS — M25569 Pain in unspecified knee: Secondary | ICD-10-CM | POA: Diagnosis not present

## 2016-03-09 DIAGNOSIS — R5383 Other fatigue: Secondary | ICD-10-CM | POA: Diagnosis not present

## 2016-03-09 DIAGNOSIS — E782 Mixed hyperlipidemia: Secondary | ICD-10-CM | POA: Diagnosis not present

## 2016-03-09 DIAGNOSIS — E049 Nontoxic goiter, unspecified: Secondary | ICD-10-CM | POA: Diagnosis not present

## 2016-03-09 DIAGNOSIS — H5211 Myopia, right eye: Secondary | ICD-10-CM | POA: Diagnosis not present

## 2016-03-09 DIAGNOSIS — N951 Menopausal and female climacteric states: Secondary | ICD-10-CM | POA: Diagnosis not present

## 2016-03-12 DIAGNOSIS — M25571 Pain in right ankle and joints of right foot: Secondary | ICD-10-CM | POA: Diagnosis not present

## 2016-03-12 DIAGNOSIS — M25569 Pain in unspecified knee: Secondary | ICD-10-CM | POA: Diagnosis not present

## 2016-03-20 DIAGNOSIS — M25571 Pain in right ankle and joints of right foot: Secondary | ICD-10-CM | POA: Diagnosis not present

## 2016-03-20 DIAGNOSIS — M25569 Pain in unspecified knee: Secondary | ICD-10-CM | POA: Diagnosis not present

## 2016-03-21 DIAGNOSIS — M25571 Pain in right ankle and joints of right foot: Secondary | ICD-10-CM | POA: Diagnosis not present

## 2016-03-21 DIAGNOSIS — M25569 Pain in unspecified knee: Secondary | ICD-10-CM | POA: Diagnosis not present

## 2016-03-26 DIAGNOSIS — M25569 Pain in unspecified knee: Secondary | ICD-10-CM | POA: Diagnosis not present

## 2016-03-26 DIAGNOSIS — M25571 Pain in right ankle and joints of right foot: Secondary | ICD-10-CM | POA: Diagnosis not present

## 2016-03-29 DIAGNOSIS — Z7722 Contact with and (suspected) exposure to environmental tobacco smoke (acute) (chronic): Secondary | ICD-10-CM | POA: Diagnosis not present

## 2016-03-29 DIAGNOSIS — Z9889 Other specified postprocedural states: Secondary | ICD-10-CM | POA: Diagnosis not present

## 2016-03-29 DIAGNOSIS — Z79899 Other long term (current) drug therapy: Secondary | ICD-10-CM | POA: Diagnosis not present

## 2016-03-29 DIAGNOSIS — Z886 Allergy status to analgesic agent status: Secondary | ICD-10-CM | POA: Diagnosis not present

## 2016-03-29 DIAGNOSIS — M1732 Unilateral post-traumatic osteoarthritis, left knee: Secondary | ICD-10-CM | POA: Diagnosis not present

## 2016-03-29 DIAGNOSIS — E039 Hypothyroidism, unspecified: Secondary | ICD-10-CM | POA: Diagnosis not present

## 2016-03-29 DIAGNOSIS — Z888 Allergy status to other drugs, medicaments and biological substances status: Secondary | ICD-10-CM | POA: Diagnosis not present

## 2016-03-30 DIAGNOSIS — M25569 Pain in unspecified knee: Secondary | ICD-10-CM | POA: Diagnosis not present

## 2016-03-30 DIAGNOSIS — M25571 Pain in right ankle and joints of right foot: Secondary | ICD-10-CM | POA: Diagnosis not present

## 2016-04-04 DIAGNOSIS — N951 Menopausal and female climacteric states: Secondary | ICD-10-CM | POA: Diagnosis not present

## 2016-04-04 DIAGNOSIS — J309 Allergic rhinitis, unspecified: Secondary | ICD-10-CM | POA: Diagnosis not present

## 2016-04-04 DIAGNOSIS — R5383 Other fatigue: Secondary | ICD-10-CM | POA: Diagnosis not present

## 2016-04-05 DIAGNOSIS — M1732 Unilateral post-traumatic osteoarthritis, left knee: Secondary | ICD-10-CM | POA: Diagnosis not present

## 2016-04-06 ENCOUNTER — Other Ambulatory Visit: Payer: Self-pay | Admitting: Family Medicine

## 2016-04-06 DIAGNOSIS — M25571 Pain in right ankle and joints of right foot: Secondary | ICD-10-CM | POA: Diagnosis not present

## 2016-04-06 DIAGNOSIS — E049 Nontoxic goiter, unspecified: Secondary | ICD-10-CM

## 2016-04-06 DIAGNOSIS — M25569 Pain in unspecified knee: Secondary | ICD-10-CM | POA: Diagnosis not present

## 2016-04-06 DIAGNOSIS — E039 Hypothyroidism, unspecified: Secondary | ICD-10-CM

## 2016-04-09 DIAGNOSIS — M25571 Pain in right ankle and joints of right foot: Secondary | ICD-10-CM | POA: Diagnosis not present

## 2016-04-09 DIAGNOSIS — M25569 Pain in unspecified knee: Secondary | ICD-10-CM | POA: Diagnosis not present

## 2016-04-11 DIAGNOSIS — Z01419 Encounter for gynecological examination (general) (routine) without abnormal findings: Secondary | ICD-10-CM | POA: Diagnosis not present

## 2016-04-11 DIAGNOSIS — M810 Age-related osteoporosis without current pathological fracture: Secondary | ICD-10-CM | POA: Diagnosis not present

## 2016-04-11 DIAGNOSIS — N952 Postmenopausal atrophic vaginitis: Secondary | ICD-10-CM | POA: Diagnosis not present

## 2016-04-11 DIAGNOSIS — Z124 Encounter for screening for malignant neoplasm of cervix: Secondary | ICD-10-CM | POA: Diagnosis not present

## 2016-04-12 DIAGNOSIS — E039 Hypothyroidism, unspecified: Secondary | ICD-10-CM | POA: Diagnosis not present

## 2016-04-12 DIAGNOSIS — Z7722 Contact with and (suspected) exposure to environmental tobacco smoke (acute) (chronic): Secondary | ICD-10-CM | POA: Diagnosis not present

## 2016-04-12 DIAGNOSIS — Z888 Allergy status to other drugs, medicaments and biological substances status: Secondary | ICD-10-CM | POA: Diagnosis not present

## 2016-04-12 DIAGNOSIS — Z91040 Latex allergy status: Secondary | ICD-10-CM | POA: Diagnosis not present

## 2016-04-12 DIAGNOSIS — M1732 Unilateral post-traumatic osteoarthritis, left knee: Secondary | ICD-10-CM | POA: Diagnosis not present

## 2016-04-12 DIAGNOSIS — Z886 Allergy status to analgesic agent status: Secondary | ICD-10-CM | POA: Diagnosis not present

## 2016-04-16 DIAGNOSIS — M25571 Pain in right ankle and joints of right foot: Secondary | ICD-10-CM | POA: Diagnosis not present

## 2016-04-16 DIAGNOSIS — M25569 Pain in unspecified knee: Secondary | ICD-10-CM | POA: Diagnosis not present

## 2016-04-18 DIAGNOSIS — M25571 Pain in right ankle and joints of right foot: Secondary | ICD-10-CM | POA: Diagnosis not present

## 2016-04-18 DIAGNOSIS — M25569 Pain in unspecified knee: Secondary | ICD-10-CM | POA: Diagnosis not present

## 2016-04-23 DIAGNOSIS — M25571 Pain in right ankle and joints of right foot: Secondary | ICD-10-CM | POA: Diagnosis not present

## 2016-04-23 DIAGNOSIS — M25569 Pain in unspecified knee: Secondary | ICD-10-CM | POA: Diagnosis not present

## 2016-04-27 DIAGNOSIS — M25571 Pain in right ankle and joints of right foot: Secondary | ICD-10-CM | POA: Diagnosis not present

## 2016-04-27 DIAGNOSIS — M25569 Pain in unspecified knee: Secondary | ICD-10-CM | POA: Diagnosis not present

## 2016-04-30 DIAGNOSIS — M25569 Pain in unspecified knee: Secondary | ICD-10-CM | POA: Diagnosis not present

## 2016-04-30 DIAGNOSIS — M25571 Pain in right ankle and joints of right foot: Secondary | ICD-10-CM | POA: Diagnosis not present

## 2016-05-01 ENCOUNTER — Other Ambulatory Visit: Payer: BLUE CROSS/BLUE SHIELD

## 2016-05-02 DIAGNOSIS — M25571 Pain in right ankle and joints of right foot: Secondary | ICD-10-CM | POA: Diagnosis not present

## 2016-05-02 DIAGNOSIS — M25569 Pain in unspecified knee: Secondary | ICD-10-CM | POA: Diagnosis not present

## 2016-05-07 DIAGNOSIS — M25571 Pain in right ankle and joints of right foot: Secondary | ICD-10-CM | POA: Diagnosis not present

## 2016-05-07 DIAGNOSIS — M25569 Pain in unspecified knee: Secondary | ICD-10-CM | POA: Diagnosis not present

## 2016-05-08 DIAGNOSIS — D239 Other benign neoplasm of skin, unspecified: Secondary | ICD-10-CM | POA: Diagnosis not present

## 2016-05-08 DIAGNOSIS — L820 Inflamed seborrheic keratosis: Secondary | ICD-10-CM | POA: Diagnosis not present

## 2016-05-14 DIAGNOSIS — M25569 Pain in unspecified knee: Secondary | ICD-10-CM | POA: Diagnosis not present

## 2016-05-14 DIAGNOSIS — M25571 Pain in right ankle and joints of right foot: Secondary | ICD-10-CM | POA: Diagnosis not present

## 2016-05-18 DIAGNOSIS — M25571 Pain in right ankle and joints of right foot: Secondary | ICD-10-CM | POA: Diagnosis not present

## 2016-05-18 DIAGNOSIS — M25569 Pain in unspecified knee: Secondary | ICD-10-CM | POA: Diagnosis not present

## 2016-05-21 DIAGNOSIS — M25569 Pain in unspecified knee: Secondary | ICD-10-CM | POA: Diagnosis not present

## 2016-05-21 DIAGNOSIS — M25571 Pain in right ankle and joints of right foot: Secondary | ICD-10-CM | POA: Diagnosis not present

## 2016-05-30 ENCOUNTER — Other Ambulatory Visit: Payer: BLUE CROSS/BLUE SHIELD

## 2016-06-06 DIAGNOSIS — N951 Menopausal and female climacteric states: Secondary | ICD-10-CM | POA: Diagnosis not present

## 2016-06-06 DIAGNOSIS — R5381 Other malaise: Secondary | ICD-10-CM | POA: Diagnosis not present

## 2016-06-06 DIAGNOSIS — E039 Hypothyroidism, unspecified: Secondary | ICD-10-CM | POA: Diagnosis not present

## 2016-06-06 DIAGNOSIS — R5383 Other fatigue: Secondary | ICD-10-CM | POA: Diagnosis not present

## 2016-06-06 DIAGNOSIS — J069 Acute upper respiratory infection, unspecified: Secondary | ICD-10-CM | POA: Diagnosis not present

## 2016-06-08 DIAGNOSIS — M25569 Pain in unspecified knee: Secondary | ICD-10-CM | POA: Diagnosis not present

## 2016-06-08 DIAGNOSIS — M25571 Pain in right ankle and joints of right foot: Secondary | ICD-10-CM | POA: Diagnosis not present

## 2016-06-13 DIAGNOSIS — M25569 Pain in unspecified knee: Secondary | ICD-10-CM | POA: Diagnosis not present

## 2016-06-13 DIAGNOSIS — M25571 Pain in right ankle and joints of right foot: Secondary | ICD-10-CM | POA: Diagnosis not present

## 2016-06-25 DIAGNOSIS — M25571 Pain in right ankle and joints of right foot: Secondary | ICD-10-CM | POA: Diagnosis not present

## 2016-06-25 DIAGNOSIS — M25569 Pain in unspecified knee: Secondary | ICD-10-CM | POA: Diagnosis not present

## 2016-06-27 DIAGNOSIS — M25571 Pain in right ankle and joints of right foot: Secondary | ICD-10-CM | POA: Diagnosis not present

## 2016-06-27 DIAGNOSIS — M25569 Pain in unspecified knee: Secondary | ICD-10-CM | POA: Diagnosis not present

## 2016-07-03 DIAGNOSIS — M25571 Pain in right ankle and joints of right foot: Secondary | ICD-10-CM | POA: Diagnosis not present

## 2016-07-03 DIAGNOSIS — M25569 Pain in unspecified knee: Secondary | ICD-10-CM | POA: Diagnosis not present

## 2016-07-04 DIAGNOSIS — M25571 Pain in right ankle and joints of right foot: Secondary | ICD-10-CM | POA: Diagnosis not present

## 2016-07-04 DIAGNOSIS — M25569 Pain in unspecified knee: Secondary | ICD-10-CM | POA: Diagnosis not present

## 2016-07-04 DIAGNOSIS — N952 Postmenopausal atrophic vaginitis: Secondary | ICD-10-CM | POA: Diagnosis not present

## 2016-07-05 DIAGNOSIS — N951 Menopausal and female climacteric states: Secondary | ICD-10-CM | POA: Diagnosis not present

## 2016-07-05 DIAGNOSIS — R079 Chest pain, unspecified: Secondary | ICD-10-CM | POA: Diagnosis not present

## 2016-07-05 DIAGNOSIS — I341 Nonrheumatic mitral (valve) prolapse: Secondary | ICD-10-CM | POA: Diagnosis not present

## 2016-07-05 DIAGNOSIS — R5383 Other fatigue: Secondary | ICD-10-CM | POA: Diagnosis not present

## 2016-07-05 DIAGNOSIS — R350 Frequency of micturition: Secondary | ICD-10-CM | POA: Diagnosis not present

## 2016-07-09 DIAGNOSIS — M25569 Pain in unspecified knee: Secondary | ICD-10-CM | POA: Diagnosis not present

## 2016-07-09 DIAGNOSIS — M25571 Pain in right ankle and joints of right foot: Secondary | ICD-10-CM | POA: Diagnosis not present

## 2016-07-17 DIAGNOSIS — M25571 Pain in right ankle and joints of right foot: Secondary | ICD-10-CM | POA: Diagnosis not present

## 2016-07-17 DIAGNOSIS — M25569 Pain in unspecified knee: Secondary | ICD-10-CM | POA: Diagnosis not present

## 2016-07-20 ENCOUNTER — Ambulatory Visit (INDEPENDENT_AMBULATORY_CARE_PROVIDER_SITE_OTHER): Payer: BLUE CROSS/BLUE SHIELD | Admitting: Internal Medicine

## 2016-07-20 ENCOUNTER — Encounter: Payer: Self-pay | Admitting: Internal Medicine

## 2016-07-20 DIAGNOSIS — M25569 Pain in unspecified knee: Secondary | ICD-10-CM | POA: Diagnosis not present

## 2016-07-20 DIAGNOSIS — M25571 Pain in right ankle and joints of right foot: Secondary | ICD-10-CM | POA: Diagnosis not present

## 2016-07-20 DIAGNOSIS — J0100 Acute maxillary sinusitis, unspecified: Secondary | ICD-10-CM

## 2016-07-20 MED ORDER — FLUCONAZOLE 150 MG PO TABS: 150.0000 mg | ORAL_TABLET | Freq: Once | ORAL | 1 refills | Status: AC

## 2016-07-20 MED ORDER — AZITHROMYCIN 250 MG PO TABS: ORAL_TABLET | ORAL | 0 refills | Status: DC

## 2016-07-20 NOTE — Assessment & Plan Note (Signed)
Zpac Diflucan if needed CXR if needed

## 2016-07-20 NOTE — Progress Notes (Signed)
Pre visit review using our clinic review tool, if applicable. No additional management support is needed unless otherwise documented below in the visit note. 

## 2016-07-20 NOTE — Progress Notes (Signed)
Subjective:  Patient ID: Jennifer [name], female    DOB: 1955-08-29  Age: 59 y.o. MRN: [id number]  CC: Cough (x 2 weeks. Using Mucinex with relief) and Sinusitis   Cough  This is a new problem. The current episode started 1 to 4 weeks ago. The problem has been rapidly improving. The problem occurs every few minutes. The cough is productive of purulent sputum. Associated symptoms include nasal congestion, rhinorrhea and wheezing. Pertinent negatives include no chills, headaches or rash. She has tried OTC cough suppressant for the symptoms. The treatment provided mild relief.  Sinusitis  This is a new problem. There has been no fever. The pain is mild. Associated symptoms include congestion and coughing. Pertinent negatives include no chills, headaches or sinus pressure. Past treatments include sitting up. The treatment provided mild relief.   Jennifer [name] presents for cough  Outpatient Medications Prior to Visit  Medication Sig Dispense Refill  . Cholecalciferol (VITAMIN D-3) 5000 UNITS TABS Take 1 tablet by mouth daily.     Marland Kitchen EPINEPHrine (EPI-PEN) 0.3 mg/0.3 mL DEVI Inject 0.3 mLs (0.3 mg total) into the muscle once. 2 Device 1  . fish oil-omega-3 fatty acids 1000 MG capsule Take 2 g by mouth daily.      . Multiple Vitamin (MULTIVITAMIN WITH MINERALS) TABS Take 1 tablet by mouth daily.    . Nutritional Supplements (DHEA PO) Take by mouth daily.    . Thyroid 16.25 MG TABS Take 1-2 tablets by mouth every other day.    . vitamin C (ASCORBIC ACID) 500 MG tablet Take 1,000 mg by mouth daily.    . Suvorexant (BELSOMRA) 10 MG TABS Take 10 mg by mouth at bedtime as needed. (Patient not taking: Reported on 07/20/2016) 30 tablet 5  . Suvorexant (BELSOMRA) 5 MG TABS Take 5 mg by mouth at bedtime. (Patient not taking: Reported on 07/20/2016) 10 tablet 0   No facility-administered medications prior to visit.     ROS Review of Systems  Constitutional: Negative for activity change, appetite  change, chills, fatigue and unexpected weight change.  HENT: Positive for congestion, rhinorrhea and sinus pain. Negative for mouth sores and sinus pressure.   Eyes: Negative for visual disturbance.  Respiratory: Positive for cough and wheezing. Negative for chest tightness.   Gastrointestinal: Negative for abdominal pain and nausea.  Genitourinary: Negative for difficulty urinating, frequency and vaginal pain.  Musculoskeletal: Negative for back pain and gait problem.  Skin: Negative for pallor and rash.  Neurological: Negative for dizziness, tremors, weakness, numbness and headaches.  Psychiatric/Behavioral: Negative for confusion and sleep disturbance.    Objective:  BP 112/70   Pulse 72   Temp 98.3 F (36.8 C) (Oral)   LMP 02/13/2007   SpO2 94%   BP Readings from Last 3 Encounters:  07/20/16 112/70  12/29/14 122/78  11/24/14 108/76    Wt Readings from Last 3 Encounters:  10/18/14 161 lb (73 kg)  07/07/14 159 lb (72.1 kg)  07/03/14 160 lb (72.6 kg)    Physical Exam  Constitutional: She appears well-developed. No distress.  HENT:  Head: Normocephalic.  Right Ear: External ear normal.  Left Ear: External ear normal.  Nose: Nose normal.  Mouth/Throat: Oropharynx is clear and moist.  Eyes: Conjunctivae are normal. Pupils are equal, round, and reactive to light. Right eye exhibits no discharge. Left eye exhibits no discharge.  Neck: Normal range of motion. Neck supple. No JVD present. No tracheal deviation present. No thyromegaly present.  Cardiovascular: Normal rate, regular rhythm  and normal heart sounds.   Pulmonary/Chest: No stridor. No respiratory distress. She has no wheezes.  Abdominal: Soft. Bowel sounds are normal. She exhibits no distension and no mass. There is no tenderness. There is no rebound and no guarding.  Musculoskeletal: She exhibits no edema or tenderness.  Lymphadenopathy:    She has no cervical adenopathy.  Neurological: She displays normal  reflexes. No cranial nerve deficit. She exhibits normal muscle tone. Coordination normal.  Skin: No rash noted. No erythema.  Psychiatric: She has a normal mood and affect. Her behavior is normal. Judgment and thought content normal.  eryth nares  Lab Results  Component Value Date   WBC 6.7 11/19/2014   HGB 14.6 11/19/2014   HCT 42.8 11/19/2014   PLT 310.0 11/19/2014   GLUCOSE 95 11/19/2014   CHOL 287 (H) 01/25/2011   TRIG 70.0 01/25/2011   HDL 96.50 01/25/2011   LDLDIRECT 202.5 01/25/2011   ALT 14 11/19/2014   AST 16 11/19/2014   NA 140 11/19/2014   K 4.5 11/19/2014   CL 104 11/19/2014   CREATININE 0.81 11/19/2014   BUN 17 11/19/2014   CO2 32 11/19/2014   TSH 1.58 11/19/2014   HGBA1C 5.7 04/10/2007    Nm Parathyroid W/spect  Result Date: 05/03/2015 CLINICAL DATA:  Evaluate for parathyroid adenoma EXAM: NM PARATHYROID SCINTIGRAPHY AND SPECT IMAGING TECHNIQUE: Following intravenous administration of radiopharmaceutical, early and 2-hour delayed planar images were obtained in the anterior projection. Delayed triplanar SPECT images were also obtained at 2 hours. RADIOPHARMACEUTICALS:  25.0 mCi Tc-56m Sestamibi IV COMPARISON:  None. FINDINGS: On the early images there is tracer activity within both lobes of the thyroid gland. On the delayed images there is partial washout of the radiopharmaceutical. No dominant focus of persistent increased uptake identified specific for parathyroid adenoma. Within the right axilla there is a small focus of increased uptake which is nonspecific but may be seen with adenopathy. Clinical correlation recommended. IMPRESSION: 1. No specific findings identified to indicate site of adenoma. 2. Nonspecific increased uptake within the right axilla. This is a finding which may be found with adenopathy. Correlation with breast cancer screening and physical exam findings recommended. Electronically Signed   By: Kerby Moors M.D.   On: 05/03/2015 16:12     Assessment & Plan:   There are no diagnoses linked to this encounter. I am having Ms. Wickware maintain her Vitamin D-3, fish oil-omega-3 fatty acids, vitamin C, multivitamin with minerals, EPINEPHrine, Nutritional Supplements (DHEA PO), Thyroid, Suvorexant, ESTRING, and LORazepam.  Meds ordered this encounter  Medications  . ESTRING 2 MG vaginal ring    Sig: INSERT 1 RING VAGINALLY EVERY 3 MONTHS AS DIRECTED    Refill:  0  . LORazepam (ATIVAN) 1 MG tablet    Sig: as needed.    Refill:  0     Follow-up: No Follow-up on file.  Walker Kehr, MD

## 2016-07-31 DIAGNOSIS — M25569 Pain in unspecified knee: Secondary | ICD-10-CM | POA: Diagnosis not present

## 2016-07-31 DIAGNOSIS — M25571 Pain in right ankle and joints of right foot: Secondary | ICD-10-CM | POA: Diagnosis not present

## 2016-08-09 DIAGNOSIS — Z79899 Other long term (current) drug therapy: Secondary | ICD-10-CM | POA: Diagnosis not present

## 2016-08-09 DIAGNOSIS — I341 Nonrheumatic mitral (valve) prolapse: Secondary | ICD-10-CM | POA: Diagnosis not present

## 2016-08-09 DIAGNOSIS — I5189 Other ill-defined heart diseases: Secondary | ICD-10-CM | POA: Diagnosis not present

## 2016-08-09 DIAGNOSIS — Z791 Long term (current) use of non-steroidal anti-inflammatories (NSAID): Secondary | ICD-10-CM | POA: Diagnosis not present

## 2016-08-09 DIAGNOSIS — R079 Chest pain, unspecified: Secondary | ICD-10-CM | POA: Diagnosis not present

## 2016-08-09 DIAGNOSIS — I340 Nonrheumatic mitral (valve) insufficiency: Secondary | ICD-10-CM | POA: Diagnosis not present

## 2016-08-10 DIAGNOSIS — M25569 Pain in unspecified knee: Secondary | ICD-10-CM | POA: Diagnosis not present

## 2016-08-10 DIAGNOSIS — M25571 Pain in right ankle and joints of right foot: Secondary | ICD-10-CM | POA: Diagnosis not present

## 2016-08-13 DIAGNOSIS — M25569 Pain in unspecified knee: Secondary | ICD-10-CM | POA: Diagnosis not present

## 2016-08-13 DIAGNOSIS — M25571 Pain in right ankle and joints of right foot: Secondary | ICD-10-CM | POA: Diagnosis not present

## 2016-08-28 DIAGNOSIS — M25571 Pain in right ankle and joints of right foot: Secondary | ICD-10-CM | POA: Diagnosis not present

## 2016-08-28 DIAGNOSIS — N898 Other specified noninflammatory disorders of vagina: Secondary | ICD-10-CM | POA: Diagnosis not present

## 2016-08-28 DIAGNOSIS — N904 Leukoplakia of vulva: Secondary | ICD-10-CM | POA: Diagnosis not present

## 2016-08-28 DIAGNOSIS — R3915 Urgency of urination: Secondary | ICD-10-CM | POA: Diagnosis not present

## 2016-08-28 DIAGNOSIS — M25569 Pain in unspecified knee: Secondary | ICD-10-CM | POA: Diagnosis not present

## 2016-08-29 DIAGNOSIS — M25571 Pain in right ankle and joints of right foot: Secondary | ICD-10-CM | POA: Diagnosis not present

## 2016-08-29 DIAGNOSIS — M25569 Pain in unspecified knee: Secondary | ICD-10-CM | POA: Diagnosis not present

## 2016-09-04 DIAGNOSIS — N904 Leukoplakia of vulva: Secondary | ICD-10-CM | POA: Diagnosis not present

## 2016-09-04 DIAGNOSIS — M1712 Unilateral primary osteoarthritis, left knee: Secondary | ICD-10-CM | POA: Diagnosis not present

## 2016-09-10 DIAGNOSIS — M25569 Pain in unspecified knee: Secondary | ICD-10-CM | POA: Diagnosis not present

## 2016-09-10 DIAGNOSIS — M25571 Pain in right ankle and joints of right foot: Secondary | ICD-10-CM | POA: Diagnosis not present

## 2016-09-28 DIAGNOSIS — M25569 Pain in unspecified knee: Secondary | ICD-10-CM | POA: Diagnosis not present

## 2016-09-28 DIAGNOSIS — M25571 Pain in right ankle and joints of right foot: Secondary | ICD-10-CM | POA: Diagnosis not present

## 2016-10-02 DIAGNOSIS — N952 Postmenopausal atrophic vaginitis: Secondary | ICD-10-CM | POA: Diagnosis not present

## 2016-10-04 DIAGNOSIS — R079 Chest pain, unspecified: Secondary | ICD-10-CM | POA: Diagnosis not present

## 2016-10-25 ENCOUNTER — Other Ambulatory Visit: Payer: Self-pay | Admitting: Family Medicine

## 2016-10-25 DIAGNOSIS — E049 Nontoxic goiter, unspecified: Secondary | ICD-10-CM

## 2016-10-25 DIAGNOSIS — N951 Menopausal and female climacteric states: Secondary | ICD-10-CM | POA: Diagnosis not present

## 2016-10-25 DIAGNOSIS — J309 Allergic rhinitis, unspecified: Secondary | ICD-10-CM | POA: Diagnosis not present

## 2016-10-25 DIAGNOSIS — R5383 Other fatigue: Secondary | ICD-10-CM | POA: Diagnosis not present

## 2016-10-29 DIAGNOSIS — M25569 Pain in unspecified knee: Secondary | ICD-10-CM | POA: Diagnosis not present

## 2016-10-29 DIAGNOSIS — M25571 Pain in right ankle and joints of right foot: Secondary | ICD-10-CM | POA: Diagnosis not present

## 2016-10-30 DIAGNOSIS — M25571 Pain in right ankle and joints of right foot: Secondary | ICD-10-CM | POA: Diagnosis not present

## 2016-10-30 DIAGNOSIS — M25569 Pain in unspecified knee: Secondary | ICD-10-CM | POA: Diagnosis not present

## 2016-11-13 DIAGNOSIS — M25562 Pain in left knee: Secondary | ICD-10-CM | POA: Diagnosis not present

## 2016-11-13 DIAGNOSIS — M1712 Unilateral primary osteoarthritis, left knee: Secondary | ICD-10-CM | POA: Diagnosis not present

## 2016-11-19 DIAGNOSIS — M25569 Pain in unspecified knee: Secondary | ICD-10-CM | POA: Diagnosis not present

## 2016-11-19 DIAGNOSIS — M25571 Pain in right ankle and joints of right foot: Secondary | ICD-10-CM | POA: Diagnosis not present

## 2016-12-17 DIAGNOSIS — M25571 Pain in right ankle and joints of right foot: Secondary | ICD-10-CM | POA: Diagnosis not present

## 2016-12-17 DIAGNOSIS — M25569 Pain in unspecified knee: Secondary | ICD-10-CM | POA: Diagnosis not present

## 2016-12-18 DIAGNOSIS — D329 Benign neoplasm of meninges, unspecified: Secondary | ICD-10-CM | POA: Diagnosis not present

## 2016-12-18 DIAGNOSIS — Z9889 Other specified postprocedural states: Secondary | ICD-10-CM | POA: Diagnosis not present

## 2016-12-19 DIAGNOSIS — D329 Benign neoplasm of meninges, unspecified: Secondary | ICD-10-CM | POA: Diagnosis not present

## 2016-12-19 DIAGNOSIS — H47292 Other optic atrophy, left eye: Secondary | ICD-10-CM | POA: Diagnosis not present

## 2016-12-19 DIAGNOSIS — H5712 Ocular pain, left eye: Secondary | ICD-10-CM | POA: Diagnosis not present

## 2016-12-19 DIAGNOSIS — H43811 Vitreous degeneration, right eye: Secondary | ICD-10-CM | POA: Diagnosis not present

## 2016-12-19 DIAGNOSIS — E039 Hypothyroidism, unspecified: Secondary | ICD-10-CM | POA: Diagnosis not present

## 2016-12-19 DIAGNOSIS — Z886 Allergy status to analgesic agent status: Secondary | ICD-10-CM | POA: Diagnosis not present

## 2016-12-19 DIAGNOSIS — Z882 Allergy status to sulfonamides status: Secondary | ICD-10-CM | POA: Diagnosis not present

## 2016-12-19 DIAGNOSIS — H43393 Other vitreous opacities, bilateral: Secondary | ICD-10-CM | POA: Diagnosis not present

## 2016-12-19 DIAGNOSIS — Z9889 Other specified postprocedural states: Secondary | ICD-10-CM | POA: Diagnosis not present

## 2016-12-19 DIAGNOSIS — Z7722 Contact with and (suspected) exposure to environmental tobacco smoke (acute) (chronic): Secondary | ICD-10-CM | POA: Diagnosis not present

## 2016-12-19 DIAGNOSIS — H5340 Unspecified visual field defects: Secondary | ICD-10-CM | POA: Diagnosis not present

## 2016-12-19 DIAGNOSIS — Z888 Allergy status to other drugs, medicaments and biological substances status: Secondary | ICD-10-CM | POA: Diagnosis not present

## 2016-12-19 DIAGNOSIS — H468 Other optic neuritis: Secondary | ICD-10-CM | POA: Diagnosis not present

## 2016-12-31 DIAGNOSIS — M25571 Pain in right ankle and joints of right foot: Secondary | ICD-10-CM | POA: Diagnosis not present

## 2016-12-31 DIAGNOSIS — M25569 Pain in unspecified knee: Secondary | ICD-10-CM | POA: Diagnosis not present

## 2017-01-03 DIAGNOSIS — N952 Postmenopausal atrophic vaginitis: Secondary | ICD-10-CM | POA: Diagnosis not present

## 2017-01-11 ENCOUNTER — Other Ambulatory Visit: Payer: BLUE CROSS/BLUE SHIELD

## 2017-01-11 ENCOUNTER — Encounter: Payer: Self-pay | Admitting: Internal Medicine

## 2017-01-11 ENCOUNTER — Ambulatory Visit (INDEPENDENT_AMBULATORY_CARE_PROVIDER_SITE_OTHER): Payer: BLUE CROSS/BLUE SHIELD | Admitting: Internal Medicine

## 2017-01-11 VITALS — BP 122/70 | HR 69 | Temp 98.5°F | Ht 63.0 in | Wt 167.0 lb

## 2017-01-11 DIAGNOSIS — E039 Hypothyroidism, unspecified: Secondary | ICD-10-CM | POA: Diagnosis not present

## 2017-01-11 DIAGNOSIS — F900 Attention-deficit hyperactivity disorder, predominantly inattentive type: Secondary | ICD-10-CM

## 2017-01-11 DIAGNOSIS — E538 Deficiency of other specified B group vitamins: Secondary | ICD-10-CM

## 2017-01-11 MED ORDER — AMPHETAMINE-DEXTROAMPHETAMINE 5 MG PO TABS: 5.0000 mg | ORAL_TABLET | Freq: Every day | ORAL | 0 refills | Status: DC

## 2017-01-11 NOTE — Assessment & Plan Note (Signed)
On B12 

## 2017-01-11 NOTE — Progress Notes (Signed)
Subjective:  Patient ID: Jennifer [name], female    DOB: 1956/06/29  Age: 61 y.o. MRN: [id number]  CC: No chief complaint on file.   HPI Jennifer [name] presents for hypothyroidism, anxiety, sleep disorder f/u C/o ADD sx's - worse Pt had a stress test (-) at [facility] in 09-04-2022 fo CP after her dog died  Outpatient Medications Prior to Visit  Medication Sig Dispense Refill  . Cholecalciferol (VITAMIN D-3) 5000 UNITS TABS Take 1 tablet by mouth daily.     Marland Kitchen EPINEPHrine (EPI-PEN) 0.3 mg/0.3 mL DEVI Inject 0.3 mLs (0.3 mg total) into the muscle once. 2 Device 1  . ESTRING 2 MG vaginal ring INSERT 1 RING VAGINALLY EVERY 3 MONTHS AS DIRECTED  0  . fish oil-omega-3 fatty acids 1000 MG capsule Take 2 g by mouth daily.      Marland Kitchen LORazepam (ATIVAN) 1 MG tablet as needed.  0  . Multiple Vitamin (MULTIVITAMIN WITH MINERALS) TABS Take 1 tablet by mouth daily.    . Nutritional Supplements (DHEA PO) Take 33 mg by mouth daily.     . Thyroid 16.25 MG TABS Take 1-2 tablets by mouth every other day.    . vitamin C (ASCORBIC ACID) 500 MG tablet Take 1,000 mg by mouth daily.    Marland Kitchen azithromycin (ZITHROMAX Z-PAK) 250 MG tablet As directed 6 each 0  . Suvorexant (BELSOMRA) 10 MG TABS Take 10 mg by mouth at bedtime as needed. (Patient not taking: Reported on 07/20/2016) 30 tablet 5   No facility-administered medications prior to visit.     ROS Review of Systems  Constitutional: Negative for activity change, appetite change, chills, fatigue and unexpected weight change.  HENT: Negative for congestion, mouth sores and sinus pressure.   Eyes: Positive for visual disturbance.  Respiratory: Negative for cough and chest tightness.   Gastrointestinal: Negative for abdominal pain and nausea.  Genitourinary: Negative for difficulty urinating, frequency and vaginal pain.  Musculoskeletal: Negative for back pain and gait problem.  Skin: Negative for pallor and rash.  Neurological: Negative for dizziness, tremors,  weakness, numbness and headaches.  Psychiatric/Behavioral: Positive for decreased concentration and sleep disturbance. Negative for confusion and suicidal ideas.    Objective:  BP 122/70 (BP Location: Left Arm, Patient Position: Sitting, Cuff Size: Normal)   Pulse 69   Temp 98.5 F (36.9 C) (Oral)   Ht 5\' 3"  (1.6 m)   Wt 167 lb (75.8 kg)   LMP 02/13/2007   SpO2 98%   BMI 29.58 kg/m   BP Readings from Last 3 Encounters:  01/11/17 122/70  07/20/16 112/70  12/29/14 122/78    Wt Readings from Last 3 Encounters:  01/11/17 167 lb (75.8 kg)  10/18/14 161 lb (73 kg)  07/07/14 159 lb (72.1 kg)    Physical Exam  Constitutional: She appears well-developed. No distress.  HENT:  Head: Normocephalic.  Right Ear: External ear normal.  Left Ear: External ear normal.  Nose: Nose normal.  Mouth/Throat: Oropharynx is clear and moist.  Eyes: Conjunctivae are normal. Pupils are equal, round, and reactive to light. Right eye exhibits no discharge. Left eye exhibits no discharge.  Neck: Normal range of motion. Neck supple. No JVD present. No tracheal deviation present. No thyromegaly present.  Cardiovascular: Normal rate, regular rhythm and normal heart sounds.   Pulmonary/Chest: No stridor. No respiratory distress. She has no wheezes.  Abdominal: Soft. Bowel sounds are normal. She exhibits no distension and no mass. There is no tenderness. There is no rebound and  no guarding.  Musculoskeletal: She exhibits tenderness. She exhibits no edema.  Lymphadenopathy:    She has no cervical adenopathy.  Neurological: She displays normal reflexes. No cranial nerve deficit. She exhibits normal muscle tone. Coordination normal.  Skin: No rash noted. No erythema.  Psychiatric: She has a normal mood and affect. Her behavior is normal. Judgment and thought content normal.    Lab Results  Component Value Date   WBC 6.7 11/19/2014   HGB 14.6 11/19/2014   HCT 42.8 11/19/2014   PLT 310.0 11/19/2014    GLUCOSE 95 11/19/2014   CHOL 287 (H) 01/25/2011   TRIG 70.0 01/25/2011   HDL 96.50 01/25/2011   LDLDIRECT 202.5 01/25/2011   ALT 14 11/19/2014   AST 16 11/19/2014   NA 140 11/19/2014   K 4.5 11/19/2014   CL 104 11/19/2014   CREATININE 0.81 11/19/2014   BUN 17 11/19/2014   CO2 32 11/19/2014   TSH 1.58 11/19/2014   HGBA1C 5.7 04/10/2007    Nm Parathyroid W/spect  Result Date: 05/03/2015 CLINICAL DATA:  Evaluate for parathyroid adenoma EXAM: NM PARATHYROID SCINTIGRAPHY AND SPECT IMAGING TECHNIQUE: Following intravenous administration of radiopharmaceutical, early and 2-hour delayed planar images were obtained in the anterior projection. Delayed triplanar SPECT images were also obtained at 2 hours. RADIOPHARMACEUTICALS:  25.0 mCi Tc-82m Sestamibi IV COMPARISON:  None. FINDINGS: On the early images there is tracer activity within both lobes of the thyroid gland. On the delayed images there is partial washout of the radiopharmaceutical. No dominant focus of persistent increased uptake identified specific for parathyroid adenoma. Within the right axilla there is a small focus of increased uptake which is nonspecific but may be seen with adenopathy. Clinical correlation recommended. IMPRESSION: 1. No specific findings identified to indicate site of adenoma. 2. Nonspecific increased uptake within the right axilla. This is a finding which may be found with adenopathy. Correlation with breast cancer screening and physical exam findings recommended. Electronically Signed   By: Kerby Moors M.D.   On: 05/03/2015 16:12    Assessment & Plan:   There are no diagnoses linked to this encounter. I have discontinued [name]'s Suvorexant and azithromycin. I am also having her maintain her Vitamin D-3, fish oil-omega-3 fatty acids, vitamin C, multivitamin with minerals, EPINEPHrine, Nutritional Supplements (DHEA PO), Thyroid, ESTRING, LORazepam, and thyroid.  Meds ordered this encounter  Medications  .  thyroid (NATURE-THROID) 32.5 MG tablet    Sig: Nature-Throid 32.5 mg tablet     Follow-up: No Follow-up on file.  Walker Kehr, MD

## 2017-01-11 NOTE — Assessment & Plan Note (Signed)
Low dose adderall  Potential benefits of a long term adderall use as well as potential risks  and complications were explained to the patient and were aknowledged. 

## 2017-01-14 DIAGNOSIS — M25569 Pain in unspecified knee: Secondary | ICD-10-CM | POA: Diagnosis not present

## 2017-01-14 DIAGNOSIS — M25571 Pain in right ankle and joints of right foot: Secondary | ICD-10-CM | POA: Diagnosis not present

## 2017-02-11 DIAGNOSIS — M25569 Pain in unspecified knee: Secondary | ICD-10-CM | POA: Diagnosis not present

## 2017-02-11 DIAGNOSIS — M25571 Pain in right ankle and joints of right foot: Secondary | ICD-10-CM | POA: Diagnosis not present

## 2017-02-25 DIAGNOSIS — M25569 Pain in unspecified knee: Secondary | ICD-10-CM | POA: Diagnosis not present

## 2017-02-25 DIAGNOSIS — M25571 Pain in right ankle and joints of right foot: Secondary | ICD-10-CM | POA: Diagnosis not present

## 2017-03-01 DIAGNOSIS — R5381 Other malaise: Secondary | ICD-10-CM | POA: Diagnosis not present

## 2017-03-01 DIAGNOSIS — E782 Mixed hyperlipidemia: Secondary | ICD-10-CM | POA: Diagnosis not present

## 2017-03-01 DIAGNOSIS — N951 Menopausal and female climacteric states: Secondary | ICD-10-CM | POA: Diagnosis not present

## 2017-03-01 DIAGNOSIS — E041 Nontoxic single thyroid nodule: Secondary | ICD-10-CM | POA: Diagnosis not present

## 2017-03-13 ENCOUNTER — Encounter: Payer: Self-pay | Admitting: Internal Medicine

## 2017-03-13 ENCOUNTER — Ambulatory Visit (INDEPENDENT_AMBULATORY_CARE_PROVIDER_SITE_OTHER): Payer: BLUE CROSS/BLUE SHIELD | Admitting: Internal Medicine

## 2017-03-13 DIAGNOSIS — H9209 Otalgia, unspecified ear: Secondary | ICD-10-CM

## 2017-03-13 DIAGNOSIS — F900 Attention-deficit hyperactivity disorder, predominantly inattentive type: Secondary | ICD-10-CM

## 2017-03-13 DIAGNOSIS — E538 Deficiency of other specified B group vitamins: Secondary | ICD-10-CM | POA: Diagnosis not present

## 2017-03-13 MED ORDER — AMPHETAMINE-DEXTROAMPHETAMINE 5 MG PO TABS: 5.0000 mg | ORAL_TABLET | Freq: Every day | ORAL | 0 refills | Status: DC

## 2017-03-13 NOTE — Assessment & Plan Note (Signed)
On B12 

## 2017-03-13 NOTE — Progress Notes (Signed)
Subjective:  Patient ID: Jennifer [name], female    DOB: [birth year]  Age: 61 y.o. MRN: [id number]  CC: No chief complaint on file.   HPI Agapita Savarino presents for ADD f/u C/o B earache  Outpatient Medications Prior to Visit  Medication Sig Dispense Refill  . amphetamine-dextroamphetamine (ADDERALL) 5 MG tablet Take 1 tablet (5 mg total) by mouth daily. 30 tablet 0  . Cholecalciferol (VITAMIN D-3) 5000 UNITS TABS Take 1 tablet by mouth daily.     Marland Kitchen EPINEPHrine (EPI-PEN) 0.3 mg/0.3 mL DEVI Inject 0.3 mLs (0.3 mg total) into the muscle once. 2 Device 1  . ESTRING 2 MG vaginal ring INSERT 1 RING VAGINALLY EVERY 3 MONTHS AS DIRECTED  0  . fish oil-omega-3 fatty acids 1000 MG capsule Take 2 g by mouth daily.      Marland Kitchen LORazepam (ATIVAN) 1 MG tablet as needed.  0  . Multiple Vitamin (MULTIVITAMIN WITH MINERALS) TABS Take 1 tablet by mouth daily.    . Nutritional Supplements (DHEA PO) Take 33 mg by mouth daily.     Marland Kitchen thyroid (NATURE-THROID) 32.5 MG tablet Nature-Throid 32.5 mg tablet    . Thyroid 16.25 MG TABS Take 1-2 tablets by mouth every other day.    . vitamin C (ASCORBIC ACID) 500 MG tablet Take 1,000 mg by mouth daily.     No facility-administered medications prior to visit.     ROS Review of Systems  Constitutional: Negative for activity change, appetite change, chills, fatigue and unexpected weight change.  HENT: Negative for congestion, mouth sores and sinus pressure.   Eyes: Negative for visual disturbance.  Respiratory: Negative for cough and chest tightness.   Gastrointestinal: Negative for abdominal pain and nausea.  Genitourinary: Negative for difficulty urinating, frequency and vaginal pain.  Musculoskeletal: Negative for back pain and gait problem.  Skin: Negative for pallor and rash.  Neurological: Negative for dizziness, tremors, weakness, numbness and headaches.  Psychiatric/Behavioral: Positive for decreased concentration. Negative for confusion and sleep  disturbance.    Objective:  BP 122/70 (BP Location: Left Arm, Patient Position: Sitting, Cuff Size: Large)   Pulse 72   Temp 98.1 F (36.7 C) (Oral)   Ht 5\' 3"  (1.6 m)   Wt 166 lb (75.3 kg)   LMP 02/13/2007   SpO2 98%   BMI 29.41 kg/m   BP Readings from Last 3 Encounters:  03/13/17 122/70  01/11/17 122/70  07/20/16 112/70    Wt Readings from Last 3 Encounters:  03/13/17 166 lb (75.3 kg)  01/11/17 167 lb (75.8 kg)  10/18/14 161 lb (73 kg)    Physical Exam  Constitutional: She appears well-developed. No distress.  HENT:  Head: Normocephalic.  Right Ear: External ear normal.  Left Ear: External ear normal.  Nose: Nose normal.  Mouth/Throat: Oropharynx is clear and moist.  Eyes: Pupils are equal, round, and reactive to light. Conjunctivae are normal. Right eye exhibits no discharge. Left eye exhibits no discharge.  Neck: Normal range of motion. Neck supple. No JVD present. No tracheal deviation present. No thyromegaly present.  Cardiovascular: Normal rate, regular rhythm and normal heart sounds.   Pulmonary/Chest: No stridor. No respiratory distress. She has no wheezes.  Abdominal: Soft. Bowel sounds are normal. She exhibits no distension and no mass. There is no tenderness. There is no rebound and no guarding.  Musculoskeletal: She exhibits no edema or tenderness.  Lymphadenopathy:    She has no cervical adenopathy.  Neurological: She displays normal reflexes. No cranial nerve deficit.  She exhibits normal muscle tone. Coordination normal.  Skin: No rash noted. No erythema.  Psychiatric: She has a normal mood and affect. Her behavior is normal. Judgment and thought content normal.      Lab Results  Component Value Date   WBC 6.7 11/19/2014   HGB 14.6 11/19/2014   HCT 42.8 11/19/2014   PLT 310.0 11/19/2014   GLUCOSE 95 11/19/2014   CHOL 287 (H) 01/25/2011   TRIG 70.0 01/25/2011   HDL 96.50 01/25/2011   LDLDIRECT 202.5 01/25/2011   ALT 14 11/19/2014   AST 16  11/19/2014   NA 140 11/19/2014   K 4.5 11/19/2014   CL 104 11/19/2014   CREATININE 0.81 11/19/2014   BUN 17 11/19/2014   CO2 32 11/19/2014   TSH 1.58 11/19/2014   HGBA1C 5.7 04/10/2007    Nm Parathyroid W/spect  Result Date: 05/03/2015 CLINICAL DATA:  Evaluate for parathyroid adenoma EXAM: NM PARATHYROID SCINTIGRAPHY AND SPECT IMAGING TECHNIQUE: Following intravenous administration of radiopharmaceutical, early and 2-hour delayed planar images were obtained in the anterior projection. Delayed triplanar SPECT images were also obtained at 2 hours. RADIOPHARMACEUTICALS:  25.0 mCi Tc-47m Sestamibi IV COMPARISON:  None. FINDINGS: On the early images there is tracer activity within both lobes of the thyroid gland. On the delayed images there is partial washout of the radiopharmaceutical. No dominant focus of persistent increased uptake identified specific for parathyroid adenoma. Within the right axilla there is a small focus of increased uptake which is nonspecific but may be seen with adenopathy. Clinical correlation recommended. IMPRESSION: 1. No specific findings identified to indicate site of adenoma. 2. Nonspecific increased uptake within the right axilla. This is a finding which may be found with adenopathy. Correlation with breast cancer screening and physical exam findings recommended. Electronically Signed   By: Kerby Moors M.D.   On: 05/03/2015 16:12    Assessment & Plan:   There are no diagnoses linked to this encounter. I am having Ms. Lobos maintain her Vitamin D-3, fish oil-omega-3 fatty acids, vitamin C, multivitamin with minerals, EPINEPHrine, Nutritional Supplements (DHEA PO), Thyroid, ESTRING, LORazepam, thyroid, and amphetamine-dextroamphetamine.  No orders of the defined types were placed in this encounter.    Follow-up: No Follow-up on file.  Walker Kehr, MD

## 2017-03-13 NOTE — Assessment & Plan Note (Signed)
Adderall Potential benefits of a long term amphetamines  use as well as potential risks  and complications were explained to the patient and were aknowledged. 

## 2017-03-13 NOTE — Assessment & Plan Note (Signed)
B sx's x 1 d Afrin and Flonase Rx

## 2017-03-19 DIAGNOSIS — R5383 Other fatigue: Secondary | ICD-10-CM | POA: Diagnosis not present

## 2017-03-19 DIAGNOSIS — J309 Allergic rhinitis, unspecified: Secondary | ICD-10-CM | POA: Diagnosis not present

## 2017-03-19 DIAGNOSIS — N951 Menopausal and female climacteric states: Secondary | ICD-10-CM | POA: Diagnosis not present

## 2017-03-26 ENCOUNTER — Encounter: Payer: Self-pay | Admitting: *Deleted

## 2017-04-08 DIAGNOSIS — M25571 Pain in right ankle and joints of right foot: Secondary | ICD-10-CM | POA: Diagnosis not present

## 2017-04-08 DIAGNOSIS — M25569 Pain in unspecified knee: Secondary | ICD-10-CM | POA: Diagnosis not present

## 2017-04-23 DIAGNOSIS — M25569 Pain in unspecified knee: Secondary | ICD-10-CM | POA: Diagnosis not present

## 2017-04-23 DIAGNOSIS — M25571 Pain in right ankle and joints of right foot: Secondary | ICD-10-CM | POA: Diagnosis not present

## 2017-04-26 DIAGNOSIS — H468 Other optic neuritis: Secondary | ICD-10-CM | POA: Diagnosis not present

## 2017-04-26 DIAGNOSIS — H469 Unspecified optic neuritis: Secondary | ICD-10-CM | POA: Diagnosis not present

## 2017-04-26 DIAGNOSIS — H5340 Unspecified visual field defects: Secondary | ICD-10-CM | POA: Diagnosis not present

## 2017-04-26 DIAGNOSIS — H43811 Vitreous degeneration, right eye: Secondary | ICD-10-CM | POA: Diagnosis not present

## 2017-04-26 DIAGNOSIS — D329 Benign neoplasm of meninges, unspecified: Secondary | ICD-10-CM | POA: Diagnosis not present

## 2017-05-02 ENCOUNTER — Ambulatory Visit
Admission: RE | Admit: 2017-05-02 | Discharge: 2017-05-02 | Disposition: A | Discharge status: Home / Self Care | Payer: BLUE CROSS/BLUE SHIELD | Source: Ambulatory Visit | Attending: Family Medicine | Admitting: Family Medicine

## 2017-05-02 DIAGNOSIS — E049 Nontoxic goiter, unspecified: Secondary | ICD-10-CM

## 2017-05-02 DIAGNOSIS — E041 Nontoxic single thyroid nodule: Secondary | ICD-10-CM | POA: Diagnosis not present

## 2017-05-06 DIAGNOSIS — M25571 Pain in right ankle and joints of right foot: Secondary | ICD-10-CM | POA: Diagnosis not present

## 2017-05-06 DIAGNOSIS — M25569 Pain in unspecified knee: Secondary | ICD-10-CM | POA: Diagnosis not present

## 2017-05-09 DIAGNOSIS — Z01419 Encounter for gynecological examination (general) (routine) without abnormal findings: Secondary | ICD-10-CM | POA: Diagnosis not present

## 2017-05-09 DIAGNOSIS — N952 Postmenopausal atrophic vaginitis: Secondary | ICD-10-CM | POA: Diagnosis not present

## 2017-05-09 DIAGNOSIS — Z1211 Encounter for screening for malignant neoplasm of colon: Secondary | ICD-10-CM | POA: Diagnosis not present

## 2017-05-09 DIAGNOSIS — Z1231 Encounter for screening mammogram for malignant neoplasm of breast: Secondary | ICD-10-CM | POA: Diagnosis not present

## 2017-05-10 DIAGNOSIS — N62 Hypertrophy of breast: Secondary | ICD-10-CM | POA: Diagnosis not present

## 2017-05-20 DIAGNOSIS — M25571 Pain in right ankle and joints of right foot: Secondary | ICD-10-CM | POA: Diagnosis not present

## 2017-05-20 DIAGNOSIS — M25569 Pain in unspecified knee: Secondary | ICD-10-CM | POA: Diagnosis not present

## 2017-06-03 ENCOUNTER — Encounter: Payer: Self-pay | Admitting: Gastroenterology

## 2017-06-03 DIAGNOSIS — M25571 Pain in right ankle and joints of right foot: Secondary | ICD-10-CM | POA: Diagnosis not present

## 2017-06-03 DIAGNOSIS — M25569 Pain in unspecified knee: Secondary | ICD-10-CM | POA: Diagnosis not present

## 2017-06-04 ENCOUNTER — Ambulatory Visit: Payer: BLUE CROSS/BLUE SHIELD | Admitting: Internal Medicine

## 2017-06-04 DIAGNOSIS — M2141 Flat foot [pes planus] (acquired), right foot: Secondary | ICD-10-CM | POA: Diagnosis not present

## 2017-06-04 DIAGNOSIS — M79671 Pain in right foot: Secondary | ICD-10-CM | POA: Diagnosis not present

## 2017-06-04 DIAGNOSIS — M19071 Primary osteoarthritis, right ankle and foot: Secondary | ICD-10-CM | POA: Diagnosis not present

## 2017-06-04 DIAGNOSIS — M2142 Flat foot [pes planus] (acquired), left foot: Secondary | ICD-10-CM | POA: Diagnosis not present

## 2017-06-04 DIAGNOSIS — J309 Allergic rhinitis, unspecified: Secondary | ICD-10-CM | POA: Diagnosis not present

## 2017-06-04 DIAGNOSIS — N951 Menopausal and female climacteric states: Secondary | ICD-10-CM | POA: Diagnosis not present

## 2017-06-04 DIAGNOSIS — R350 Frequency of micturition: Secondary | ICD-10-CM | POA: Diagnosis not present

## 2017-06-04 DIAGNOSIS — R5383 Other fatigue: Secondary | ICD-10-CM | POA: Diagnosis not present

## 2017-06-06 DIAGNOSIS — M1712 Unilateral primary osteoarthritis, left knee: Secondary | ICD-10-CM | POA: Diagnosis not present

## 2017-06-10 DIAGNOSIS — M25569 Pain in unspecified knee: Secondary | ICD-10-CM | POA: Diagnosis not present

## 2017-06-10 DIAGNOSIS — M25571 Pain in right ankle and joints of right foot: Secondary | ICD-10-CM | POA: Diagnosis not present

## 2017-06-11 ENCOUNTER — Ambulatory Visit: Payer: BLUE CROSS/BLUE SHIELD | Admitting: Internal Medicine

## 2017-06-11 DIAGNOSIS — E538 Deficiency of other specified B group vitamins: Secondary | ICD-10-CM

## 2017-06-11 DIAGNOSIS — F900 Attention-deficit hyperactivity disorder, predominantly inattentive type: Secondary | ICD-10-CM | POA: Diagnosis not present

## 2017-06-11 MED ORDER — METHYLPHENIDATE HCL 5 MG PO TABS: 5.0000 mg | ORAL_TABLET | Freq: Two times a day (BID) | ORAL | 0 refills | Status: DC

## 2017-06-11 NOTE — Progress Notes (Signed)
Subjective:  Patient ID: Jennifer [name], female    DOB: 14-Dec-1955  Age: 61 y.o. MRN: [id number]  CC: No chief complaint on file.   HPI Lonnie Rosado presents for ADD, hypothyroidism. Getting biofeedback at [facility]... C/o occ GERD sx's   Outpatient Medications Prior to Visit  Medication Sig Dispense Refill  . amphetamine-dextroamphetamine (ADDERALL) 5 MG tablet Take 1 tablet (5 mg total) by mouth daily. 30 tablet 0  . Cholecalciferol (VITAMIN D-3) 5000 UNITS TABS Take 1 tablet by mouth daily.     Marland Kitchen EPINEPHrine (EPI-PEN) 0.3 mg/0.3 mL DEVI Inject 0.3 mLs (0.3 mg total) into the muscle once. 2 Device 1  . ESTRING 2 MG vaginal ring INSERT 1 RING VAGINALLY EVERY 3 MONTHS AS DIRECTED  0  . fish oil-omega-3 fatty acids 1000 MG capsule Take 2 g by mouth daily.      Marland Kitchen LORazepam (ATIVAN) 1 MG tablet as needed.  0  . Multiple Vitamin (MULTIVITAMIN WITH MINERALS) TABS Take 1 tablet by mouth daily.    . Nutritional Supplements (DHEA PO) Take 33 mg by mouth daily.     Marland Kitchen thyroid (NATURE-THROID) 32.5 MG tablet Nature-Throid 32.5 mg tablet    . Thyroid 16.25 MG TABS Take 1-2 tablets by mouth every other day.    . vitamin C (ASCORBIC ACID) 500 MG tablet Take 1,000 mg by mouth daily.     No facility-administered medications prior to visit.     ROS Review of Systems  Constitutional: Negative for activity change, appetite change, chills, fatigue and unexpected weight change.  HENT: Negative for congestion, mouth sores and sinus pressure.   Eyes: Negative for visual disturbance.  Respiratory: Negative for cough and chest tightness.   Gastrointestinal: Negative for abdominal pain and nausea.  Genitourinary: Negative for difficulty urinating, frequency and vaginal pain.  Musculoskeletal: Negative for back pain and gait problem.  Skin: Negative for pallor and rash.  Neurological: Negative for dizziness, tremors, weakness, numbness and headaches.  Psychiatric/Behavioral: Positive  for decreased concentration. Negative for confusion and sleep disturbance. The patient is nervous/anxious.     Objective:  BP 122/80   Pulse 73   Temp 98.1 F (36.7 C) (Oral)   Ht 5\' 3"  (1.6 m)   Wt 165 lb 12.8 oz (75.2 kg)   LMP 02/13/2007   SpO2 98%   BMI 29.37 kg/m   BP Readings from Last 3 Encounters:  06/11/17 122/80  03/13/17 122/70  01/11/17 122/70    Wt Readings from Last 3 Encounters:  06/11/17 165 lb 12.8 oz (75.2 kg)  03/13/17 166 lb (75.3 kg)  01/11/17 167 lb (75.8 kg)    Physical Exam  Constitutional: She appears well-developed. No distress.  HENT:  Head: Normocephalic.  Right Ear: External ear normal.  Left Ear: External ear normal.  Nose: Nose normal.  Mouth/Throat: Oropharynx is clear and moist.  Eyes: Conjunctivae are normal. Pupils are equal, round, and reactive to light. Right eye exhibits no discharge. Left eye exhibits no discharge.  Neck: Normal range of motion. Neck supple. No JVD present. No tracheal deviation present. No thyromegaly present.  Cardiovascular: Normal rate, regular rhythm and normal heart sounds.  Pulmonary/Chest: No stridor. No respiratory distress. She has no wheezes.  Abdominal: Soft. Bowel sounds are normal. She exhibits no distension and no mass. There is no tenderness. There is no rebound and no guarding.  Musculoskeletal: She exhibits no edema or tenderness.  Lymphadenopathy:    She has no cervical adenopathy.  Neurological: She displays normal  reflexes. No cranial nerve deficit. She exhibits normal muscle tone. Coordination normal.  Skin: No rash noted. No erythema.  Psychiatric: She has a normal mood and affect. Her behavior is normal. Judgment and thought content normal.    Lab Results  Component Value Date   WBC 6.7 11/19/2014   HGB 14.6 11/19/2014   HCT 42.8 11/19/2014   PLT 310.0 11/19/2014   GLUCOSE 95 11/19/2014   CHOL 287 (H) 01/25/2011   TRIG 70.0 01/25/2011   HDL 96.50 01/25/2011   LDLDIRECT 202.5  01/25/2011   ALT 14 11/19/2014   AST 16 11/19/2014   NA 140 11/19/2014   K 4.5 11/19/2014   CL 104 11/19/2014   CREATININE 0.81 11/19/2014   BUN 17 11/19/2014   CO2 32 11/19/2014   TSH 1.58 11/19/2014   HGBA1C 5.7 04/10/2007    US Thyroid  Result Date: 05/02/2017 CLINICAL DATA:  60 year old female with a history of nodule follow-up. EXAM: THYROID ULTRASOUND TECHNIQUE: Ultrasound examination of the thyroid gland and adjacent soft tissues was performed. COMPARISON:  04/14/2015, 09/09/2014, 01/12/2013 FINDINGS: Parenchymal Echotexture: Mildly heterogenous Isthmus: 0.2 cm Right lobe: 3.5 cm x 1.1 cm x 1.1 cm Left lobe: 4.0 cm x 1.5 cm by 1.1 cm _________________________________________________________ Estimated total number of nodules >/= 1 cm: 0 Number of spongiform nodules >/=  2 cm not described below (TR1): 0 Number of mixed cystic and solid nodules >/= 1.5 cm not described below (TR2): 0 _________________________________________________________ Nodule # 1: Location: Right; Superior Maximum size: 0.6 cm; Other 2 dimensions: 0.4 cm x 0.5 cm Composition: solid/almost completely solid (2) Echogenicity: isoechoic (1) Shape: not taller-than-wide (0) Margins: ill-defined (0) Echogenic foci: none (0) ACR TI-RADS total points: 3. ACR TI-RADS risk category: TR3 (3 points). ACR TI-RADS recommendations: Nodule does not meet criteria for surveillance or biopsy _________________________________________________________ Nodule # 2: Location: Right; Mid Maximum size: 0.6 cm; Other 2 dimensions: 0.5 cm x 0.4 cm Composition: solid/almost completely solid (2) Echogenicity: isoechoic (1) Shape: not taller-than-wide (0) Margins: ill-defined (0) Echogenic foci: none (0) ACR TI-RADS total points: 3. ACR TI-RADS risk category: TR3 (3 points). ACR TI-RADS recommendations: Nodule does not meet criteria for surveillance or biopsy _________________________________________________________ Nodule # 3: Location: Left; Mid Maximum  size: 0.7 cm; Other 2 dimensions: 0.4 cm x 0.7 cm Composition: solid/almost completely solid (2) Echogenicity: isoechoic (1) Shape: not taller-than-wide (0) Margins: ill-defined (0) Echogenic foci: none (0) ACR TI-RADS total points: 3. ACR TI-RADS risk category: TR3 (3 points). ACR TI-RADS recommendations: Nodule does not meet criteria for surveillance or biopsy _________________________________________________________ Nodule # 4: Location: Left; Inferior Maximum size: 0.9 cm; Other 2 dimensions: 0.4 cm x 0.8 cm Composition: spongiform (0) ACR TI-RADS recommendations: Spongiform nodule does not meet criteria for surveillance or biopsy _________________________________________________________ Nodule # 5: Location: Left; Inferior Maximum size: Bb 0.8 cm; Other 2 dimensions: 0.6 cm x 0.7 cm Composition: spongiform (0) ACR TI-RADS recommendations: Spongiform nodule does not meet criteria for surveillance or biopsy _________________________________________________________ No adenopathy IMPRESSION: No thyroid nodule meets criteria for biopsy or surveillance, as designated by the newly established ACR TI-RADS criteria. Recommendations follow those established by the new ACR TI-RADS criteria (J Am Coll Radiol 8841;66:063-016). Electronically Signed   By: Corrie Mckusick D.O.   On: 05/02/2017 15:47    Assessment & Plan:   There are no diagnoses linked to this encounter. I am having Jennifer [name] maintain her Vitamin D-3, fish oil-omega-3 fatty acids, vitamin C, multivitamin with minerals, EPINEPHrine, Nutritional Supplements (DHEA PO), Thyroid, ESTRING,  LORazepam, thyroid, and amphetamine-dextroamphetamine.  No orders of the defined types were placed in this encounter.    Follow-up: No Follow-up on file.  Walker Kehr, MD

## 2017-06-11 NOTE — Assessment & Plan Note (Signed)
Cont w/low dose adderall  Potential benefits of a long term adderall use as well as potential risks  and complications were explained to the patient and were aknowledged.

## 2017-06-11 NOTE — Assessment & Plan Note (Signed)
On B12 

## 2017-06-12 ENCOUNTER — Encounter: Payer: Self-pay | Admitting: Internal Medicine

## 2017-06-25 ENCOUNTER — Telehealth: Payer: Self-pay | Admitting: Internal Medicine

## 2017-06-25 DIAGNOSIS — M25571 Pain in right ankle and joints of right foot: Secondary | ICD-10-CM | POA: Diagnosis not present

## 2017-06-25 DIAGNOSIS — M25569 Pain in unspecified knee: Secondary | ICD-10-CM | POA: Diagnosis not present

## 2017-06-25 DIAGNOSIS — R300 Dysuria: Secondary | ICD-10-CM

## 2017-06-25 NOTE — Telephone Encounter (Signed)
Please advise on Urinalysis

## 2017-06-25 NOTE — Telephone Encounter (Signed)
Copied from [place] [phone]. Topic: Quick Communication - See Telephone Encounter >> Jun 25, 2017  4:09 PM Percell Belt A wrote: CRM for notification. See Telephone encounter for: pt called in and said that she has a uti and would like to know if she could come by and leave a sample instead of coming in?    Pharmacy -the one on file  06/25/17.

## 2017-06-26 ENCOUNTER — Other Ambulatory Visit (INDEPENDENT_AMBULATORY_CARE_PROVIDER_SITE_OTHER): Payer: BLUE CROSS/BLUE SHIELD

## 2017-06-26 DIAGNOSIS — R300 Dysuria: Secondary | ICD-10-CM

## 2017-06-26 LAB — URINALYSIS
BILIRUBIN URINE: NEGATIVE
Hgb urine dipstick: NEGATIVE
Leukocytes, UA: NEGATIVE
Nitrite: NEGATIVE
Specific Gravity, Urine: <=1.005 — AB (ref 1.000–1.030)
TOTAL PROTEIN, URINE-UPE24: NEGATIVE
URINE GLUCOSE: NEGATIVE
Urobilinogen, UA: 0.2 (ref 0.0–1.0)
pH: 6 (ref 5.0–8.0)

## 2017-06-26 NOTE — Telephone Encounter (Signed)
Pt notified labs ordered.

## 2017-06-26 NOTE — Telephone Encounter (Signed)
Pt called the [facility] back checking in this. Please advise.

## 2017-06-27 LAB — URINE CULTURE
MICRO NUMBER:: 81336434
Result:: NO GROWTH
SPECIMEN QUALITY: ADEQUATE

## 2017-07-11 DIAGNOSIS — M1712 Unilateral primary osteoarthritis, left knee: Secondary | ICD-10-CM | POA: Diagnosis not present

## 2017-07-11 DIAGNOSIS — M7052 Other bursitis of knee, left knee: Secondary | ICD-10-CM | POA: Diagnosis not present

## 2017-07-11 DIAGNOSIS — M25562 Pain in left knee: Secondary | ICD-10-CM | POA: Diagnosis not present

## 2017-07-11 DIAGNOSIS — G894 Chronic pain syndrome: Secondary | ICD-10-CM | POA: Insufficient documentation

## 2017-07-16 DIAGNOSIS — M25571 Pain in right ankle and joints of right foot: Secondary | ICD-10-CM | POA: Diagnosis not present

## 2017-07-16 DIAGNOSIS — M25569 Pain in unspecified knee: Secondary | ICD-10-CM | POA: Diagnosis not present

## 2017-07-27 ENCOUNTER — Other Ambulatory Visit: Payer: Self-pay

## 2017-07-27 ENCOUNTER — Encounter: Payer: Self-pay | Admitting: Emergency Medicine

## 2017-07-27 ENCOUNTER — Emergency Department
Admission: EM | Admit: 2017-07-27 | Discharge: 2017-07-27 | Disposition: A | Discharge status: Home / Self Care | Payer: BLUE CROSS/BLUE SHIELD | Source: Home / Self Care | Attending: Family Medicine | Admitting: Family Medicine

## 2017-07-27 DIAGNOSIS — R0981 Nasal congestion: Secondary | ICD-10-CM

## 2017-07-27 DIAGNOSIS — H5789 Other specified disorders of eye and adnexa: Secondary | ICD-10-CM

## 2017-07-27 MED ORDER — OLOPATADINE HCL 0.1 % OP SOLN: 1.0000 [drp] | Freq: Two times a day (BID) | OPHTHALMIC | 0 refills | Status: DC

## 2017-07-27 MED ORDER — AZITHROMYCIN 250 MG PO TABS: 250.0000 mg | ORAL_TABLET | Freq: Every day | ORAL | 0 refills | Status: DC

## 2017-07-27 NOTE — Discharge Instructions (Signed)
°  Your symptoms are likely due to a virus such as the common cold, however, if you developing worsening chest congestion with shortness of breath, persistent fever (100.4*F) for 3 days, or symptoms not improving in 4-5 days, you may fill the antibiotic (azithromycin).  If you do fill the antibiotic,  please take antibiotics as prescribed and be sure to complete entire course even if you start to feel better to ensure infection does not come back.  You may take 500mg  acetaminophen every 4-6 hours or in combination with ibuprofen 400-600mg  every 6-8 hours as needed for pain, inflammation, and fever.  Be sure to drink at least eight 8oz glasses of water to stay well hydrated and get at least 8 hours of sleep at night, preferably more while sick.

## 2017-07-27 NOTE — ED Provider Notes (Signed)
[facility]    CSN: [id number] Arrival date & time: 07/27/17  1545     History   Chief Complaint Chief Complaint  Patient presents with  . Nasal Congestion  . Burning Eyes    itching  . Sore Throat    HPI Jennifer [name] is a 61 y.o. female.   HPI  Jennifer [name] is a 61 y.o. female presenting to UC with c/o sudden onset nasal congestion and itchy reddened eyes since yesterday.  Pt is concerned she cannot blow out the congestion in her nose. She has not tried any OTC medications "that crap never works."  Pt notes she has cosmetic surgery scheduled for the January 8th and cannot have the surgery if she is sick. Pt states she is "never" sick and feels like she has a sinus infection.  She notes her significant other, who is also in UC to be seen for nasal congestion, returned from [place] about 1 week ago. She is concerned he "gave her something."   Denies fever, chills, n/v/d. No cough.    Past Medical History:  Diagnosis Date  . ADD (attention deficit disorder)   . Allergy    latex  . Anxiety   . Complication of anesthesia    Difficulty waking up  . Depression   . Dyspareunia, female 2006  . Excessive menses 2006  . H/O bladder infections   . H/O folliculitis 2440  . H/O mitral valve prolapse   . Hx of colectomy 2/15   diverticulitis   Dr Morton Stall  . Hx: UTI (urinary tract infection) 2009  . Hyperlipidemia   . Hypothyroidism   . IBS (irritable bowel syndrome)   . Kidney infection   . Knee pain   . Libido, decreased 2006  . Meningioma [facility]) 2011   Near optic nerve  . Menopausal symptoms 2006  . Optic neuritis 2009   Left  . Osteoarthritis   . Osteopenia   . PMB (postmenopausal bleeding) 2006  . Renal stones 2006  . Urinary urgency 2009  . Vaginal atrophy 2007  . Vaginal dryness 2007  . Vaginal spotting 2006  . Vitamin D deficiency     Patient Active Problem List   Diagnosis Date Noted  . Earache 03/13/2017  . Osteoarthritis of right  foot 12/29/2014  . Medically noncompliant 12/29/2014  . Fatigue 11/19/2014  . Insomnia 11/19/2014  . Foot pain, right 10/18/2014  . Pes anserine bursitis 06/07/2014  . 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 Loss of transverse plantar arch 05/14/2014  . B12 deficiency 05/12/2014  . Well adult exam 05/12/2014  . Pain in joint, ankle and foot 05/12/2014  . S/P left colectomy 12/24/2013  . Laceration of finger of left hand 12/24/2013  . Unspecified constipation 06/29/2013  . Sinusitis 09/16/2012  . Floater, vitreous 08/15/2011  . Dysesthesia 01/25/2011  . Meningioma ([place]) 11/14/2010  . Persistent disorder of initiating or maintaining sleep 09/22/2010  . NEOPLASM OF UNCERTAIN BEHAVIOR OF SKIN 07/14/2010  . Anxiety state 07/14/2010  . HEART MURMUR, SYSTOLIC 05/26/2535  . DEPRESSION 11/07/2009  . OBESITY 11/04/2009  . GERD 09/30/2009  . 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 OPTIC NEURITIS 02/25/2008  . Low vision, one eye 12/03/2007  . Attention deficit disorder 11/17/2007  . Hypothyroidism 07/22/2007  . OSTEOARTHRITIS 07/22/2007  . OSTEOPENIA 07/22/2007  . VERTIGO 07/22/2007  . VITAMIN D DEFICIENCY 02/27/2007  . HYPERLIPIDEMIA 02/27/2007    Past Surgical History:  Procedure Laterality Date  .  KNEE SURGERY     Left for Fracture  . TONSILLECTOMY    . WISDOM TOOTH EXTRACTION      OB History    Gravida Para Term Preterm AB Living   1       1     SAB TAB Ectopic Multiple Live Births   1               Home Medications    Prior to Admission medications   Medication Sig Start Date End Date Taking? Authorizing Provider  azithromycin (ZITHROMAX) 250 MG tablet Take 1 tablet (250 mg total) by mouth daily. Take first 2 tablets together, then 1 every day until finished. 07/27/17   Noe Gens, PA-C  Cholecalciferol (VITAMIN D-3) 5000 UNITS TABS Take 1 tablet by mouth daily.     [provider]  EPINEPHrine (EPI-PEN) 0.3 mg/0.3 mL  DEVI Inject 0.3 mLs (0.3 mg total) into the muscle once. 01/18/13   Jeffery, Chelle, PA-C  ESTRING 2 MG vaginal ring INSERT 1 RING VAGINALLY EVERY 3 MONTHS AS DIRECTED 06/28/16   [provider]  fish oil-omega-3 fatty acids 1000 MG capsule Take 2 g by mouth daily.      [provider]  LORazepam (ATIVAN) 1 MG tablet as needed. 07/13/16   [provider]  methylphenidate (RITALIN) 5 MG tablet Take 1 tablet (5 mg total) 2 (two) times daily with breakfast and lunch by mouth. 06/11/17   Plotnikov, Evie Lacks, MD  Multiple Vitamin (MULTIVITAMIN WITH MINERALS) TABS Take 1 tablet by mouth daily.    [provider]  Nutritional Supplements (DHEA PO) Take 33 mg by mouth daily.     [provider]  olopatadine (PATANOL) 0.1 % ophthalmic solution Place 1 drop into both eyes 2 (two) times daily. 07/27/17   Noe Gens, PA-C  thyroid (NATURE-THROID) 32.5 MG tablet Nature-Throid 32.5 mg tablet    [provider]  Thyroid 16.25 MG TABS Take 1-2 tablets by mouth every other day.    [provider]  vitamin C (ASCORBIC ACID) 500 MG tablet Take 1,000 mg by mouth daily.    [provider]    Family History Family History  Problem Relation Age of Onset  . Cancer Mother        meningioma  . Heart disease Other   . Colon cancer Neg Hx     Social History Social History   Tobacco Use  . Smoking status: Former Smoker    Packs/day: 1.00    Years: 8.00    Pack years: 8.00    Types: Cigarettes    Last attempt to quit: 07/31/1983    Years since quitting: 34.0  . Smokeless tobacco: Never Used  Substance Use Topics  . Alcohol use: Yes    Alcohol/week: 4.2 oz    Types: 7 Glasses of wine per week  . Drug use: No     Allergies   Aspirin; Bee venom; Latex; Alprazolam; Epinephrine; Ibuprofen; Pregabalin; Pristiq [desvenlafaxine succinate monohydrate]; Progesterone; Propoxyphene n-acetaminophen; Sulfa antibiotics; and Zolpidem   Review  of Systems Review of Systems  Constitutional: Negative for chills and fever.  HENT: Positive for congestion, postnasal drip, sinus pressure and sore throat. Negative for ear pain, rhinorrhea, sinus pain, trouble swallowing and voice change.   Respiratory: Negative for cough and shortness of breath.   Cardiovascular: Negative for chest pain and palpitations.  Gastrointestinal: Negative for abdominal pain, diarrhea, nausea and vomiting.  Musculoskeletal: Negative for arthralgias, back pain  and myalgias.  Skin: Negative for rash.     Physical Exam Triage Vital Signs ED Triage Vitals  Enc Vitals Group     BP 07/27/17 1609 125/76     Pulse Rate 07/27/17 1609 60     Resp 07/27/17 1609 16     Temp 07/27/17 1609 97.7 F (36.5 C)     Temp Source 07/27/17 1609 Oral     SpO2 07/27/17 1609 100 %     Weight 07/27/17 1610 159 lb (72.1 kg)     Height 07/27/17 1610 5\' 3"  (1.6 m)     Head Circumference --      Peak Flow --      Pain Score --      Pain Loc --      Pain Edu? --      Excl. in [place]? --    No data found.  Updated Vital Signs BP 125/76 (BP Location: Right Arm)   Pulse 60   Temp 97.7 F (36.5 C) (Oral)   Resp 16   Ht 5\' 3"  (1.6 m)   Wt 159 lb (72.1 kg)   LMP 02/13/2007   SpO2 100%   BMI 28.17 kg/m   Visual Acuity Right Eye Distance:   Left Eye Distance:   Bilateral Distance:    Right Eye Near:   Left Eye Near:    Bilateral Near:     Physical Exam  Constitutional: She is oriented to person, place, and time. She appears well-developed and well-nourished.  Non-toxic appearance. She does not appear ill. No distress.  HENT:  Head: Normocephalic and atraumatic.  Right Ear: Tympanic membrane normal.  Left Ear: Tympanic membrane normal.  Nose: Nose normal. No mucosal edema. Right sinus exhibits no maxillary sinus tenderness and no frontal sinus tenderness. Left sinus exhibits no maxillary sinus tenderness and no frontal sinus tenderness.  Mouth/Throat: Uvula is midline,  oropharynx is clear and moist and mucous membranes are normal.  Eyes: EOM and lids are normal. Pupils are equal, round, and reactive to light. Right eye exhibits no chemosis and no discharge. Left eye exhibits no chemosis and no discharge. Right conjunctiva is not injected. Left conjunctiva is not injected.  Neck: Normal range of motion. Neck supple.  Cardiovascular: Normal rate and regular rhythm.  Pulmonary/Chest: Effort normal and breath sounds normal. No stridor. No respiratory distress. She has no wheezes. She has no rhonchi. She has no rales.  Musculoskeletal: Normal range of motion.  Lymphadenopathy:    She has no cervical adenopathy.  Neurological: She is alert and oriented to person, place, and time.  Skin: Skin is warm and dry. Capillary refill takes less than 2 seconds. No rash noted.  Psychiatric: She has a normal mood and affect. Her behavior is normal.  Nursing note and vitals reviewed.    UC Treatments / Results  Labs (all labs ordered are listed, but only abnormal results are displayed) Labs Reviewed - No data to display  EKG  EKG Interpretation None       Radiology No results found.  Procedures Procedures (including critical care time)  Medications Ordered in UC Medications - No data to display   Initial Impression / Assessment and Plan / UC Course  I have reviewed the triage vital signs and the nursing notes.  Pertinent labs & imaging results that were available during my care of the patient were reviewed by me and considered in my medical decision making (see chart for details).     Pt c/o  sudden onset nasal congestion and facial pressure with bilateral eye irritation. Hx and exam c/w early viral illness such as common cold or allergies. Strongly encouraged symptomatic treatment including sinus rinses. Home care instructions provided. Prescription provided for Azithromycin as pt insisted she needed antibiotics.  Discussed with pt she may  unfortunately still feel bad after antibiotics if symptoms are viral in nature.  F/u with PCP in 7-10 days if not improving.   Final Clinical Impressions(s) / UC Diagnoses   Final diagnoses:  Nasal congestion  Eye irritation    ED Discharge Orders        Ordered    azithromycin (ZITHROMAX) 250 MG tablet  Daily     07/27/17 1640    olopatadine (PATANOL) 0.1 % ophthalmic solution  2 times daily     07/27/17 1640       Controlled Substance Prescriptions [place] Controlled Substance Registry consulted? Not Applicable   Tyrell Antonio 07/27/17 1655

## 2017-07-27 NOTE — ED Triage Notes (Signed)
Reports congestion, reddened and itchy eyes since yesterday.

## 2017-07-29 DIAGNOSIS — M25571 Pain in right ankle and joints of right foot: Secondary | ICD-10-CM | POA: Diagnosis not present

## 2017-07-29 DIAGNOSIS — M25569 Pain in unspecified knee: Secondary | ICD-10-CM | POA: Diagnosis not present

## 2017-07-31 ENCOUNTER — Telehealth: Payer: Self-pay

## 2017-07-31 NOTE — Telephone Encounter (Signed)
Called, Attempted to leave a message, VM full.

## 2017-08-06 DIAGNOSIS — H9122 Sudden idiopathic hearing loss, left ear: Secondary | ICD-10-CM | POA: Insufficient documentation

## 2017-08-06 DIAGNOSIS — H9313 Tinnitus, bilateral: Secondary | ICD-10-CM | POA: Insufficient documentation

## 2017-08-06 DIAGNOSIS — H9042 Sensorineural hearing loss, unilateral, left ear, with unrestricted hearing on the contralateral side: Secondary | ICD-10-CM | POA: Diagnosis not present

## 2017-08-06 DIAGNOSIS — H9319 Tinnitus, unspecified ear: Secondary | ICD-10-CM | POA: Diagnosis not present

## 2017-08-08 DIAGNOSIS — J309 Allergic rhinitis, unspecified: Secondary | ICD-10-CM | POA: Diagnosis not present

## 2017-08-08 DIAGNOSIS — R350 Frequency of micturition: Secondary | ICD-10-CM | POA: Diagnosis not present

## 2017-08-08 DIAGNOSIS — R5383 Other fatigue: Secondary | ICD-10-CM | POA: Diagnosis not present

## 2017-08-08 DIAGNOSIS — N951 Menopausal and female climacteric states: Secondary | ICD-10-CM | POA: Diagnosis not present

## 2017-08-27 DIAGNOSIS — H903 Sensorineural hearing loss, bilateral: Secondary | ICD-10-CM | POA: Insufficient documentation

## 2017-08-27 DIAGNOSIS — J3089 Other allergic rhinitis: Secondary | ICD-10-CM | POA: Diagnosis not present

## 2017-09-02 DIAGNOSIS — M25569 Pain in unspecified knee: Secondary | ICD-10-CM | POA: Diagnosis not present

## 2017-09-02 DIAGNOSIS — M25571 Pain in right ankle and joints of right foot: Secondary | ICD-10-CM | POA: Diagnosis not present

## 2017-09-09 ENCOUNTER — Ambulatory Visit: Payer: BLUE CROSS/BLUE SHIELD | Admitting: Internal Medicine

## 2017-09-09 ENCOUNTER — Encounter: Payer: Self-pay | Admitting: Internal Medicine

## 2017-09-09 DIAGNOSIS — F900 Attention-deficit hyperactivity disorder, predominantly inattentive type: Secondary | ICD-10-CM | POA: Diagnosis not present

## 2017-09-09 DIAGNOSIS — E538 Deficiency of other specified B group vitamins: Secondary | ICD-10-CM | POA: Diagnosis not present

## 2017-09-09 DIAGNOSIS — F411 Generalized anxiety disorder: Secondary | ICD-10-CM

## 2017-09-09 DIAGNOSIS — E034 Atrophy of thyroid (acquired): Secondary | ICD-10-CM

## 2017-09-09 MED ORDER — AMPHETAMINE-DEXTROAMPHETAMINE 5 MG PO TABS: 5.0000 mg | ORAL_TABLET | Freq: Every day | ORAL | 0 refills | Status: DC

## 2017-09-09 MED ORDER — BUPROPION HCL ER (XL) 150 MG PO TB24
150.0000 mg | ORAL_TABLET | Freq: Every day | ORAL | 5 refills | Status: DC
Start: 2017-09-09 — End: 2018-03-24

## 2017-09-09 NOTE — Assessment & Plan Note (Signed)
Low dose adderall  Potential benefits of a long term adderall use as well as potential risks  and complications were explained to the patient and were aknowledged. We can add Wellbutrin XL

## 2017-09-09 NOTE — Assessment & Plan Note (Signed)
-  

## 2017-09-09 NOTE — Assessment & Plan Note (Signed)
Thyroid 16.25 qd

## 2017-09-09 NOTE — Progress Notes (Signed)
Subjective:  Patient ID: Jennifer [name], female    DOB: 11-Jul-1956  Age: 62 y.o. MRN: [id number]  CC: No chief complaint on file.   HPI Jennifer [name] presents for ADD, anxiety f/u Pt had n/v/d due to Zithromax  Outpatient Medications Prior to Visit  Medication Sig Dispense Refill  . Cholecalciferol (VITAMIN D-3) 5000 UNITS TABS Take 1 tablet by mouth daily.     Marland Kitchen EPINEPHrine (EPI-PEN) 0.3 mg/0.3 mL DEVI Inject 0.3 mLs (0.3 mg total) into the muscle once. 2 Device 1  . ESTRING 2 MG vaginal ring INSERT 1 RING VAGINALLY EVERY 3 MONTHS AS DIRECTED  0  . fish oil-omega-3 fatty acids 1000 MG capsule Take 2 g by mouth daily.      Marland Kitchen LORazepam (ATIVAN) 1 MG tablet as needed.  0  . methylphenidate (RITALIN) 5 MG tablet Take 1 tablet (5 mg total) 2 (two) times daily with breakfast and lunch by mouth. 60 tablet 0  . Multiple Vitamin (MULTIVITAMIN WITH MINERALS) TABS Take 1 tablet by mouth daily.    . Nutritional Supplements (DHEA PO) Take 33 mg by mouth daily.     Marland Kitchen thyroid (NATURE-THROID) 32.5 MG tablet Nature-Throid 32.5 mg tablet    . vitamin C (ASCORBIC ACID) 500 MG tablet Take 1,000 mg by mouth daily.    Marland Kitchen olopatadine (PATANOL) 0.1 % ophthalmic solution Place 1 drop into both eyes 2 (two) times daily. 5 mL 0  . azithromycin (ZITHROMAX) 250 MG tablet Take 1 tablet (250 mg total) by mouth daily. Take first 2 tablets together, then 1 every day until finished. 6 tablet 0  . Thyroid 16.25 MG TABS Take 1-2 tablets by mouth every other day.     No facility-administered medications prior to visit.     ROS Review of Systems  Constitutional: Negative for activity change, appetite change, chills, fatigue and unexpected weight change.  HENT: Negative for congestion, mouth sores and sinus pressure.   Eyes: Negative for visual disturbance.  Respiratory: Negative for cough and chest tightness.   Gastrointestinal: Negative for abdominal pain and nausea.  Genitourinary: Negative for difficulty  urinating, frequency and vaginal pain.  Musculoskeletal: Negative for back pain and gait problem.  Skin: Negative for pallor and rash.  Neurological: Negative for dizziness, tremors, weakness, numbness and headaches.  Psychiatric/Behavioral: Positive for sleep disturbance. Negative for confusion. The patient is nervous/anxious.     Objective:  BP 134/68 (BP Location: Left Arm, Patient Position: Sitting, Cuff Size: Large)   Pulse 62   Temp 98.3 F (36.8 C) (Oral)   Ht 5\' 3"  (1.6 m)   Wt 161 lb (73 kg)   LMP 02/13/2007   SpO2 99%   BMI 28.52 kg/m   BP Readings from Last 3 Encounters:  09/09/17 134/68  07/27/17 125/76  06/11/17 122/80    Wt Readings from Last 3 Encounters:  09/09/17 161 lb (73 kg)  07/27/17 159 lb (72.1 kg)  06/11/17 165 lb 12.8 oz (75.2 kg)    Physical Exam  Constitutional: She appears well-developed. No distress.  HENT:  Head: Normocephalic.  Right Ear: External ear normal.  Left Ear: External ear normal.  Nose: Nose normal.  Mouth/Throat: Oropharynx is clear and moist.  Eyes: Conjunctivae are normal. Pupils are equal, round, and reactive to light. Right eye exhibits no discharge. Left eye exhibits no discharge.  Neck: Normal range of motion. Neck supple. No JVD present. No tracheal deviation present. No thyromegaly present.  Cardiovascular: Normal rate, regular rhythm and normal heart  sounds.  Pulmonary/Chest: No stridor. No respiratory distress. She has no wheezes.  Abdominal: Soft. Bowel sounds are normal. She exhibits no distension and no mass. There is no tenderness. There is no rebound and no guarding.  Musculoskeletal: She exhibits no edema or tenderness.  Lymphadenopathy:    She has no cervical adenopathy.  Neurological: She displays normal reflexes. No cranial nerve deficit. She exhibits normal muscle tone. Coordination normal.  Skin: No rash noted. No erythema.  Psychiatric: She has a normal mood and affect. Her behavior is normal. Judgment  and thought content normal.    Lab Results  Component Value Date   WBC 6.7 11/19/2014   HGB 14.6 11/19/2014   HCT 42.8 11/19/2014   PLT 310.0 11/19/2014   GLUCOSE 95 11/19/2014   CHOL 287 (H) 01/25/2011   TRIG 70.0 01/25/2011   HDL 96.50 01/25/2011   LDLDIRECT 202.5 01/25/2011   ALT 14 11/19/2014   AST 16 11/19/2014   NA 140 11/19/2014   K 4.5 11/19/2014   CL 104 11/19/2014   CREATININE 0.81 11/19/2014   BUN 17 11/19/2014   CO2 32 11/19/2014   TSH 1.58 11/19/2014   HGBA1C 5.7 04/10/2007    No results found.  Assessment & Plan:   There are no diagnoses linked to this encounter. I have discontinued Jennifer [name]'s azithromycin and olopatadine. I am also having her maintain her Vitamin D-3, fish oil-omega-3 fatty acids, vitamin C, multivitamin with minerals, EPINEPHrine, Nutritional Supplements (DHEA PO), ESTRING, LORazepam, thyroid, and methylphenidate.  No orders of the defined types were placed in this encounter.    Follow-up: No Follow-up on file.  Walker Kehr, MD

## 2017-09-09 NOTE — Assessment & Plan Note (Signed)
On B12 

## 2017-09-10 ENCOUNTER — Ambulatory Visit: Payer: BLUE CROSS/BLUE SHIELD | Admitting: Internal Medicine

## 2017-09-10 DIAGNOSIS — R5383 Other fatigue: Secondary | ICD-10-CM | POA: Diagnosis not present

## 2017-09-10 DIAGNOSIS — N951 Menopausal and female climacteric states: Secondary | ICD-10-CM | POA: Diagnosis not present

## 2017-09-10 DIAGNOSIS — J309 Allergic rhinitis, unspecified: Secondary | ICD-10-CM | POA: Diagnosis not present

## 2017-09-16 DIAGNOSIS — M25571 Pain in right ankle and joints of right foot: Secondary | ICD-10-CM | POA: Diagnosis not present

## 2017-09-16 DIAGNOSIS — M25569 Pain in unspecified knee: Secondary | ICD-10-CM | POA: Diagnosis not present

## 2017-09-17 ENCOUNTER — Telehealth: Payer: Self-pay

## 2017-09-17 ENCOUNTER — Other Ambulatory Visit (INDEPENDENT_AMBULATORY_CARE_PROVIDER_SITE_OTHER): Payer: BLUE CROSS/BLUE SHIELD

## 2017-09-17 DIAGNOSIS — N952 Postmenopausal atrophic vaginitis: Secondary | ICD-10-CM | POA: Diagnosis not present

## 2017-09-17 DIAGNOSIS — Z01818 Encounter for other preprocedural examination: Secondary | ICD-10-CM | POA: Diagnosis not present

## 2017-09-17 LAB — CBC
HCT: 43.6 % (ref 36.0–46.0)
Hemoglobin: 14.6 g/dL (ref 12.0–15.0)
MCHC: 33.6 g/dL (ref 30.0–36.0)
MCV: 93.4 fl (ref 78.0–100.0)
Platelets: 333 10*3/uL (ref 150.0–400.0)
RBC: 4.67 Mil/uL (ref 3.87–5.11)
RDW: 14.2 % (ref 11.5–15.5)
WBC: 7.4 10*3/uL (ref 4.0–10.5)

## 2017-09-18 ENCOUNTER — Telehealth: Payer: Self-pay | Admitting: Internal Medicine

## 2017-09-18 NOTE — Telephone Encounter (Unsigned)
Copied from [place] [id number]. Topic: Quick Communication - See Telephone Encounter >> Sep 18, 2017 11:37 AM Percell Belt A wrote: CRM for notification. See Telephone encounter for:   pt has an appt with DR Stephanie Coup in [place] and they need CBC faxed over today.  She has an appt today at 3:30  FAX number [phone]   09/18/17.

## 2017-09-18 NOTE — Telephone Encounter (Signed)
I have completed this request

## 2017-10-14 DIAGNOSIS — M25571 Pain in right ankle and joints of right foot: Secondary | ICD-10-CM | POA: Diagnosis not present

## 2017-10-14 DIAGNOSIS — M25569 Pain in unspecified knee: Secondary | ICD-10-CM | POA: Diagnosis not present

## 2017-10-28 DIAGNOSIS — M25571 Pain in right ankle and joints of right foot: Secondary | ICD-10-CM | POA: Diagnosis not present

## 2017-10-28 DIAGNOSIS — M25569 Pain in unspecified knee: Secondary | ICD-10-CM | POA: Diagnosis not present

## 2017-11-07 ENCOUNTER — Ambulatory Visit: Payer: Self-pay | Admitting: *Deleted

## 2017-11-07 NOTE — Telephone Encounter (Signed)
Pt  Reports   Pain  And Pressure   Left   Eye    -    Pt  Reports   The  Pain   Is  3  Or  4   At  This  Time  Is   The  Pain is  7   Yesterday    The   Pt  Has  Blurred  Vision as  Well    Started  On the  Wellbutrin  Several   Weeks  She  Is  Attributing  Her  Symptoms  To  The  Wellbutrin She  Wants to  Stop taking the  Wellbutrin  And  States  She  Only  took  1/2  Of the pill  Today  And  She  Is  Not  Going to take   Any  More until  She  Is  evaluated . She  States  She  Has  Already  Spoken to  Her  Neurologist about  These   Symptoms  She reports only  Has  Vision  In one  Eye  And  She  Wants  To make  Sure  The  Good  Eye  Is  Not  Affected . Appointment  Made  For tomorrow  Am  With Jodi Mourning at [phone] . Dr Alain Marion  has  No openings Pt  Advised  To  Go to er  If  Symptoms  Worse

## 2017-11-08 ENCOUNTER — Ambulatory Visit: Payer: BLUE CROSS/BLUE SHIELD | Admitting: Family

## 2017-11-08 ENCOUNTER — Encounter: Payer: Self-pay | Admitting: Family

## 2017-11-08 VITALS — BP 128/70 | HR 68 | Temp 98.0°F | Ht 63.0 in | Wt 155.0 lb

## 2017-11-08 DIAGNOSIS — H5712 Ocular pain, left eye: Secondary | ICD-10-CM

## 2017-11-08 MED ORDER — EPINEPHRINE 0.3 MG/0.3ML IJ SOAJ: 0.3000 mg | Freq: Once | INTRAMUSCULAR | 0 refills | Status: AC

## 2017-11-08 MED ORDER — AMPHETAMINE-DEXTROAMPHETAMINE 5 MG PO TABS: 5.0000 mg | ORAL_TABLET | Freq: Every day | ORAL | 0 refills | Status: DC

## 2017-11-08 NOTE — Patient Instructions (Signed)
Wellbutrin taper:  Take every other day x 3 days; then take every 3rd day x 2 days and then okay to stop;

## 2017-11-08 NOTE — Progress Notes (Signed)
Jennifer [name] is a 62 y.o. female with the following history as recorded in [employer]:  Patient Active Problem List   Diagnosis Date Noted  . Earache 03/13/2017  . Osteoarthritis of right foot 12/29/2014  . Medically noncompliant 12/29/2014  . Fatigue 11/19/2014  . Insomnia 11/19/2014  . Foot pain, right 10/18/2014  . Pes anserine bursitis 06/07/2014  . 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 Loss of transverse plantar arch 05/14/2014  . B12 deficiency 05/12/2014  . Well adult exam 05/12/2014  . Pain in joint, ankle and foot 05/12/2014  . S/P left colectomy 12/24/2013  . Laceration of finger of left hand 12/24/2013  . Unspecified constipation 06/29/2013  . Sinusitis 09/16/2012  . Floater, vitreous 08/15/2011  . Dysesthesia 01/25/2011  . Meningioma ([place]) 11/14/2010  . Persistent disorder of initiating or maintaining sleep 09/22/2010  . NEOPLASM OF UNCERTAIN BEHAVIOR OF SKIN 07/14/2010  . Anxiety state 07/14/2010  . HEART MURMUR, SYSTOLIC 16/07/930  . DEPRESSION 11/07/2009  . OBESITY 11/04/2009  . GERD 09/30/2009  . 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 OPTIC NEURITIS 02/25/2008  . Low vision, one eye 12/03/2007  . Attention deficit disorder 11/17/2007  . Hypothyroidism 07/22/2007  . OSTEOARTHRITIS 07/22/2007  . OSTEOPENIA 07/22/2007  . VERTIGO 07/22/2007  . VITAMIN D DEFICIENCY 02/27/2007  . HYPERLIPIDEMIA 02/27/2007    Current Outpatient Medications  Medication Sig Dispense Refill  . amphetamine-dextroamphetamine (ADDERALL) 5 MG tablet Take 1 tablet (5 mg total) by mouth daily. 30 tablet 0  . buPROPion (WELLBUTRIN XL) 150 MG 24 hr tablet Take 1 tablet (150 mg total) by mouth daily. 30 tablet 5  . Cholecalciferol (VITAMIN D-3) 5000 UNITS TABS Take 1 tablet by mouth daily.     . fish oil-omega-3 fatty acids 1000 MG capsule Take 2 g by mouth daily.      Marland Kitchen LORazepam (ATIVAN) 1 MG tablet as needed.  0  . Multiple Vitamin (MULTIVITAMIN WITH  MINERALS) TABS Take 1 tablet by mouth daily.    . Nutritional Supplements (DHEA PO) Take 33 mg by mouth daily.     Marland Kitchen thyroid (NATURE-THROID) 32.5 MG tablet Nature-Throid 32.5 mg tablet    . vitamin C (ASCORBIC ACID) 500 MG tablet Take 1,000 mg by mouth daily.    Marland Kitchen EPINEPHrine 0.3 mg/0.3 mL IJ SOAJ injection Inject 0.3 mLs (0.3 mg total) into the muscle once for 1 dose. 2 Device 0  . ESTRING 2 MG vaginal ring INSERT 1 RING VAGINALLY EVERY 3 MONTHS AS DIRECTED  0   No current facility-administered medications for this visit.     Allergies: Aspirin; Bee venom; Latex; Alprazolam; Epinephrine; Ibuprofen; Pregabalin; Pristiq [desvenlafaxine succinate monohydrate]; Progesterone; Propoxyphene n-acetaminophen; Sulfa antibiotics; Zithromax [azithromycin]; and Zolpidem  Past Medical History:  Diagnosis Date  . ADD (attention deficit disorder)   . Allergy    latex  . Anxiety   . Complication of anesthesia    Difficulty waking up  . Depression   . Dyspareunia, female 2006  . Excessive menses 2006  . H/O bladder infections   . H/O folliculitis 3557  . H/O mitral valve prolapse   . Hx of colectomy 2/15   diverticulitis   Dr Morton Stall  . Hx: UTI (urinary tract infection) 2009  . Hyperlipidemia   . Hypothyroidism   . IBS (irritable bowel syndrome)   . Kidney infection   . Knee pain   . Libido, decreased 2006  . Meningioma [facility]) 2011   Near optic nerve  .  Menopausal symptoms 2006  . Optic neuritis 2009   Left  . Osteoarthritis   . Osteopenia   . PMB (postmenopausal bleeding) 2006  . Renal stones 2006  . Urinary urgency 2009  . Vaginal atrophy 2007  . Vaginal dryness 2007  . Vaginal spotting 2006  . Vitamin D deficiency     Past Surgical History:  Procedure Laterality Date  . KNEE SURGERY     Left for Fracture  . TONSILLECTOMY    . WISDOM TOOTH EXTRACTION      Family History  Problem Relation Age of Onset  . Cancer Mother        meningioma  . Heart disease Other   . Colon cancer  Neg Hx     Social History   Tobacco Use  . Smoking status: Former Smoker    Packs/day: 1.00    Years: 8.00    Pack years: 8.00    Types: Cigarettes    Last attempt to quit: 07/31/1983    Years since quitting: 34.2  . Smokeless tobacco: Never Used  Substance Use Topics  . Alcohol use: Yes    Alcohol/week: 4.2 oz    Types: 7 Glasses of wine per week    Subjective:  2 days ago patient notes that she was having pain in her left eye/ sense of fullness/ blurry vision; has history of ocular migraines/ meningioma behind left eye; has already spoken to her ophthalmologist/ neurosurgeon- neither felt that these acute symptoms were related to her past ophthalmic history; has MRI scheduled/ eye exam scheduled for follow-up; patient has decided that her symptoms are related to her Wellbutrin- wants to discuss coming off the Wellbutrin; has been on the Wellbutrin x 2 weeks; also requesting refill on her Adderall; she was given Wellbutrin as alternative to her Adderall and to help with depression; last Rx for Adderall was dated 08/2017;   Objective:  Vitals:   11/08/17 1024  BP: 128/70  Pulse: 68  Temp: 98 F (36.7 C)  TempSrc: Oral  SpO2: 96%  Weight: 155 lb (70.3 kg)  Height: 5\' 3"  (1.6 m)    General: Well developed, well nourished, in no acute distress  Skin : Warm and dry.  Head: Normocephalic and atraumatic  Eyes: Sclera and conjunctiva clear; pupils round and reactive to light; extraocular movements intact  Lungs: Respirations unlabored; clear to auscultation bilaterally without wheeze, rales, rhonchi  Neurologic: Alert and oriented; speech intact; face symmetrical; moves all extremities well; CNII-XII intact without focal deficit   Assessment:  1. Pain of left eye     Plan:  Patient is adamant that her symptoms are related to Wellbutrin and taper is discussed; stressed to patient that this is unusual side effect and very important that she keep her follow-up with her eye doctor- she  agrees and expresses understanding;  I did give one month refill on Adderall until she sees her PCP next month;  Discussed treatment options for her depression- both in agreement not to start new medications at this time; she also mentions that she will see her homeopathic provider to discuss options since she has been so sensitive to numerous anti-depressants.   Refill for Epi-pen is updated per her request.   No follow-ups on file.  No orders of the defined types were placed in this encounter.   Requested Prescriptions   Signed Prescriptions Disp Refills  . EPINEPHrine 0.3 mg/0.3 mL IJ SOAJ injection 2 Device 0    Sig: Inject 0.3 mLs (0.3 mg  total) into the muscle once for 1 dose.  Marland Kitchen amphetamine-dextroamphetamine (ADDERALL) 5 MG tablet 30 tablet 0    Sig: Take 1 tablet (5 mg total) by mouth daily.

## 2017-11-11 DIAGNOSIS — M25569 Pain in unspecified knee: Secondary | ICD-10-CM | POA: Diagnosis not present

## 2017-11-11 DIAGNOSIS — M25571 Pain in right ankle and joints of right foot: Secondary | ICD-10-CM | POA: Diagnosis not present

## 2017-11-21 DIAGNOSIS — H04123 Dry eye syndrome of bilateral lacrimal glands: Secondary | ICD-10-CM | POA: Diagnosis not present

## 2017-11-25 DIAGNOSIS — M25569 Pain in unspecified knee: Secondary | ICD-10-CM | POA: Diagnosis not present

## 2017-11-25 DIAGNOSIS — M25571 Pain in right ankle and joints of right foot: Secondary | ICD-10-CM | POA: Diagnosis not present

## 2017-12-09 ENCOUNTER — Ambulatory Visit: Payer: BLUE CROSS/BLUE SHIELD | Admitting: Internal Medicine

## 2017-12-09 ENCOUNTER — Encounter: Payer: Self-pay | Admitting: Internal Medicine

## 2017-12-09 DIAGNOSIS — E538 Deficiency of other specified B group vitamins: Secondary | ICD-10-CM | POA: Diagnosis not present

## 2017-12-09 DIAGNOSIS — E559 Vitamin D deficiency, unspecified: Secondary | ICD-10-CM

## 2017-12-09 DIAGNOSIS — F900 Attention-deficit hyperactivity disorder, predominantly inattentive type: Secondary | ICD-10-CM

## 2017-12-09 DIAGNOSIS — G4700 Insomnia, unspecified: Secondary | ICD-10-CM

## 2017-12-09 DIAGNOSIS — Z79890 Hormone replacement therapy (postmenopausal): Secondary | ICD-10-CM | POA: Diagnosis not present

## 2017-12-09 MED ORDER — AMPHETAMINE-DEXTROAMPHETAMINE 5 MG PO TABS: 5.0000 mg | ORAL_TABLET | Freq: Every day | ORAL | 0 refills | Status: DC

## 2017-12-09 MED ORDER — AMPHETAMINE-DEXTROAMPHETAMINE 5 MG PO TABS
5.0000 mg | ORAL_TABLET | Freq: Every day | ORAL | 0 refills | Status: DC
Start: 2017-12-09 — End: 2017-12-09

## 2017-12-09 NOTE — Assessment & Plan Note (Signed)
Chronic Low dose adderall  Potential benefits of a long term adderall use as well as potential risks  and complications were explained to the patient and were aknowledged.

## 2017-12-09 NOTE — Progress Notes (Signed)
Subjective:  Patient ID: Jennifer [name], female    DOB: 03-04-1956  Age: 62 y.o. MRN: [id number]  CC: No chief complaint on file.   HPI Jennifer [name] presents for ADD, sleep issues  Outpatient Medications Prior to Visit  Medication Sig Dispense Refill  . amphetamine-dextroamphetamine (ADDERALL) 5 MG tablet Take 1 tablet (5 mg total) by mouth daily. 30 tablet 0  . buPROPion (WELLBUTRIN XL) 150 MG 24 hr tablet Take 1 tablet (150 mg total) by mouth daily. 30 tablet 5  . Cholecalciferol (VITAMIN D-3) 5000 UNITS TABS Take 1 tablet by mouth daily.     Marland Kitchen ESTRING 2 MG vaginal ring INSERT 1 RING VAGINALLY EVERY 3 MONTHS AS DIRECTED  0  . fish oil-omega-3 fatty acids 1000 MG capsule Take 2 g by mouth daily.      Marland Kitchen LORazepam (ATIVAN) 1 MG tablet as needed.  0  . Multiple Vitamin (MULTIVITAMIN WITH MINERALS) TABS Take 1 tablet by mouth daily.    . Nutritional Supplements (DHEA PO) Take 33 mg by mouth daily.     Marland Kitchen thyroid (NATURE-THROID) 32.5 MG tablet Nature-Throid 32.5 mg tablet    . vitamin C (ASCORBIC ACID) 500 MG tablet Take 1,000 mg by mouth daily.     No facility-administered medications prior to visit.     ROS Review of Systems  Constitutional: Negative for activity change, appetite change, chills, fatigue and unexpected weight change.  HENT: Negative for congestion, mouth sores and sinus pressure.   Eyes: Negative for visual disturbance.  Respiratory: Negative for cough and chest tightness.   Gastrointestinal: Negative for abdominal pain and nausea.  Genitourinary: Negative for difficulty urinating, frequency and vaginal pain.  Musculoskeletal: Negative for back pain and gait problem.  Skin: Negative for pallor and rash.  Neurological: Negative for dizziness, tremors, weakness, numbness and headaches.  Psychiatric/Behavioral: Positive for decreased concentration. Negative for confusion and sleep disturbance.    Objective:  BP 126/76 (BP Location: Left Arm, Patient  Position: Sitting, Cuff Size: Normal)   Pulse 74   Temp 98 F (36.7 C) (Oral)   Ht 5\' 3"  (1.6 m)   Wt 152 lb (68.9 kg)   LMP 02/13/2007   SpO2 98%   BMI 26.93 kg/m   BP Readings from Last 3 Encounters:  12/09/17 126/76  11/08/17 128/70  09/09/17 134/68    Wt Readings from Last 3 Encounters:  12/09/17 152 lb (68.9 kg)  11/08/17 155 lb (70.3 kg)  09/09/17 161 lb (73 kg)    Physical Exam  Constitutional: She appears well-developed. No distress.  HENT:  Head: Normocephalic.  Right Ear: External ear normal.  Left Ear: External ear normal.  Nose: Nose normal.  Mouth/Throat: Oropharynx is clear and moist.  Eyes: Pupils are equal, round, and reactive to light. Conjunctivae are normal. Right eye exhibits no discharge. Left eye exhibits no discharge.  Neck: Normal range of motion. Neck supple. No JVD present. No tracheal deviation present. No thyromegaly present.  Cardiovascular: Normal rate, regular rhythm and normal heart sounds.  Pulmonary/Chest: No stridor. No respiratory distress. She has no wheezes.  Abdominal: Soft. Bowel sounds are normal. She exhibits no distension and no mass. There is no tenderness. There is no rebound and no guarding.  Musculoskeletal: She exhibits no edema or tenderness.  Lymphadenopathy:    She has no cervical adenopathy.  Neurological: She displays normal reflexes. No cranial nerve deficit. She exhibits normal muscle tone. Coordination normal.  Skin: No rash noted. No erythema.  Psychiatric: She has  a normal mood and affect. Her behavior is normal. Judgment and thought content normal.    Lab Results  Component Value Date   WBC 7.4 09/17/2017   HGB 14.6 09/17/2017   HCT 43.6 09/17/2017   PLT 333.0 09/17/2017   GLUCOSE 95 11/19/2014   CHOL 287 (H) 01/25/2011   TRIG 70.0 01/25/2011   HDL 96.50 01/25/2011   LDLDIRECT 202.5 01/25/2011   ALT 14 11/19/2014   AST 16 11/19/2014   NA 140 11/19/2014   K 4.5 11/19/2014   CL 104 11/19/2014    CREATININE 0.81 11/19/2014   BUN 17 11/19/2014   CO2 32 11/19/2014   TSH 1.58 11/19/2014   HGBA1C 5.7 04/10/2007    No results found.  Assessment & Plan:   There are no diagnoses linked to this encounter. I am having Jennifer [name] maintain her Vitamin D-3, fish oil-omega-3 fatty acids, vitamin C, multivitamin with minerals, Nutritional Supplements (DHEA PO), ESTRING, LORazepam, thyroid, buPROPion, and amphetamine-dextroamphetamine.  No orders of the defined types were placed in this encounter.    Follow-up: No follow-ups on file.  Walker Kehr, MD

## 2017-12-09 NOTE — Assessment & Plan Note (Signed)
On B12 inj 

## 2017-12-09 NOTE — Patient Instructions (Signed)
Try Valerian root for sleep

## 2017-12-09 NOTE — Assessment & Plan Note (Signed)
Valerian root 

## 2017-12-09 NOTE — Assessment & Plan Note (Signed)
Vit D 

## 2017-12-09 NOTE — Assessment & Plan Note (Signed)
Per her homeopathic doctor - on a cream

## 2017-12-13 IMAGING — US US THYROID
1 series · 12 of 25 positions shown · non-contrast
Comparison: 04/14/2015, 09/09/2014, 01/12/2013

CLINICAL DATA: 60-year-old female with a history of nodule
follow-up.

EXAM:
THYROID ULTRASOUND
TECHNIQUE: Ultrasound examination of the thyroid gland and adjacent soft
tissues was performed.

[Series 1: us thyroid · 0.02mm/px · 12 of 48 slices shown]
[im 2/48]
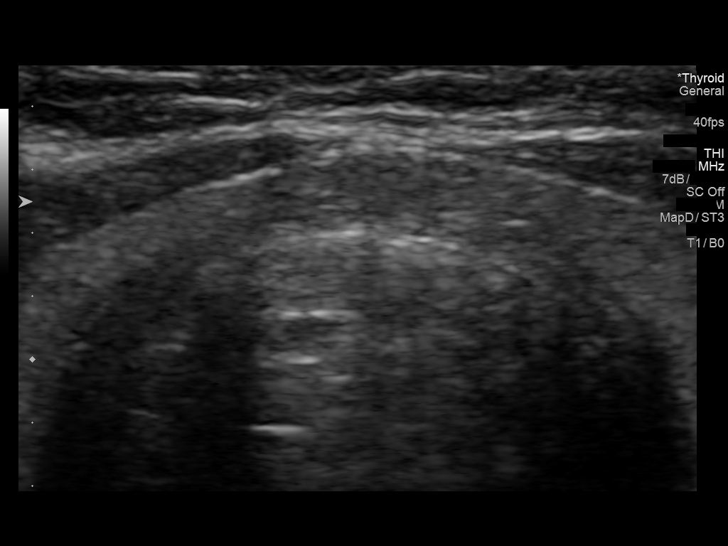
[im 6/48]
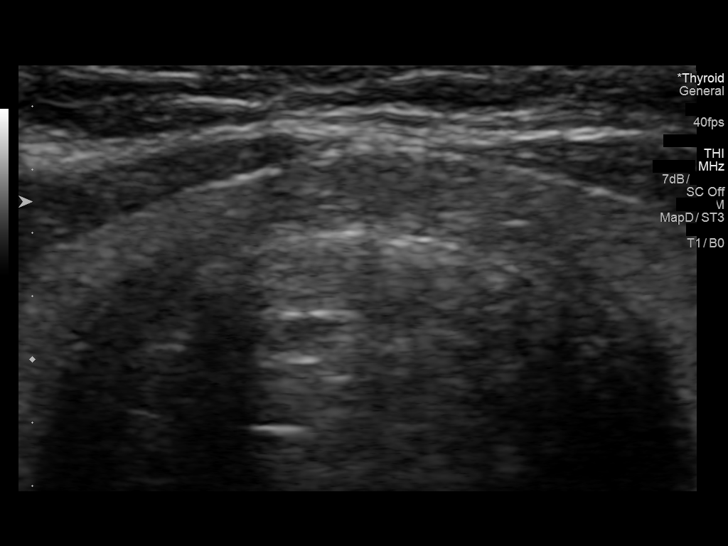
[im 10/48]
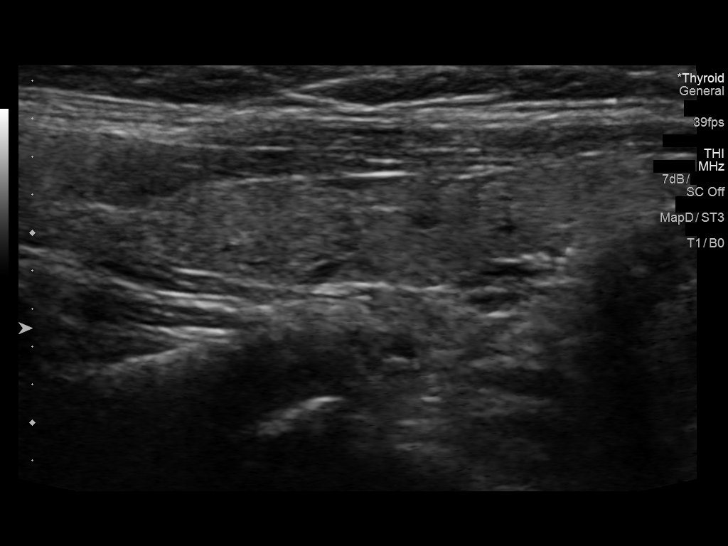
[im 14/48]
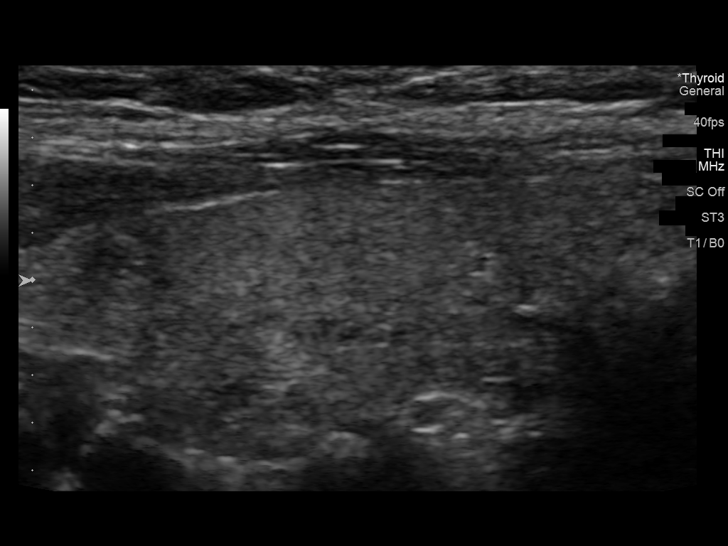
[im 18/48]
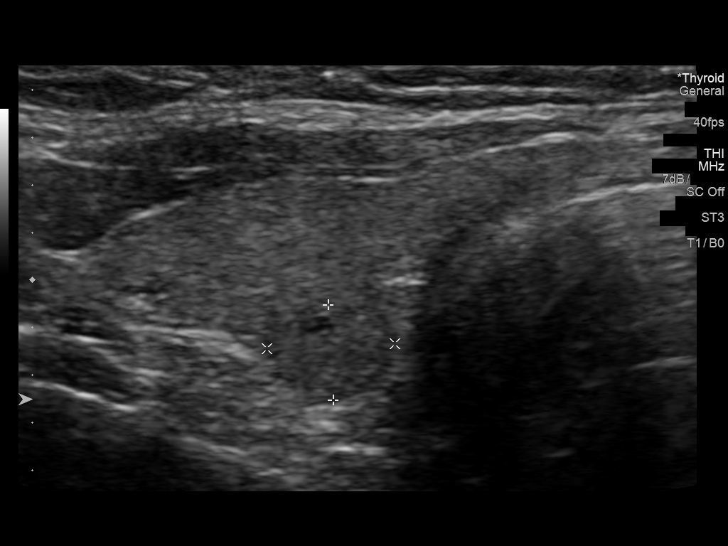
[im 22/48]
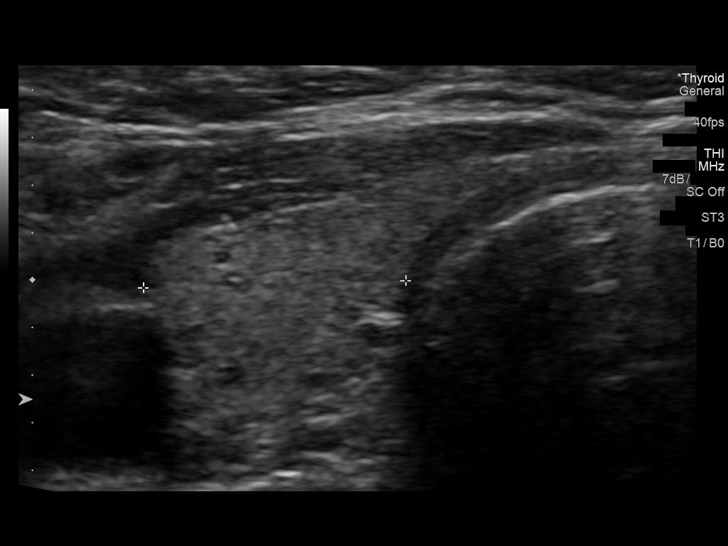
[im 26/48]
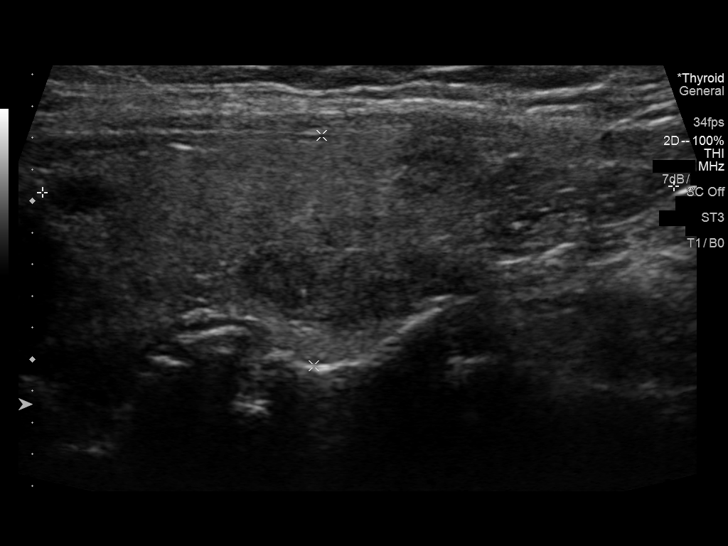
[im 30/48]
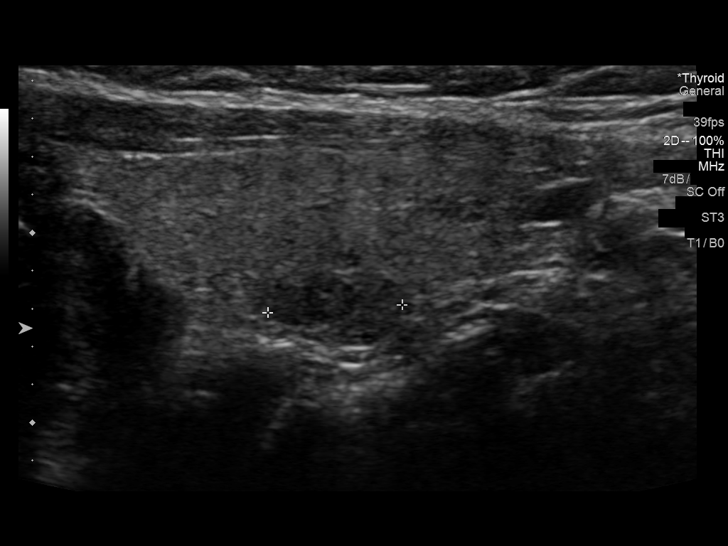
[im 34/48]
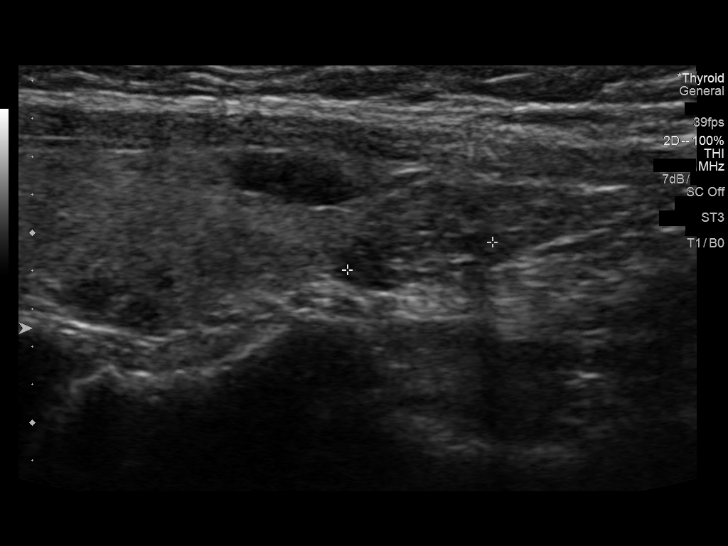
[im 38/48]
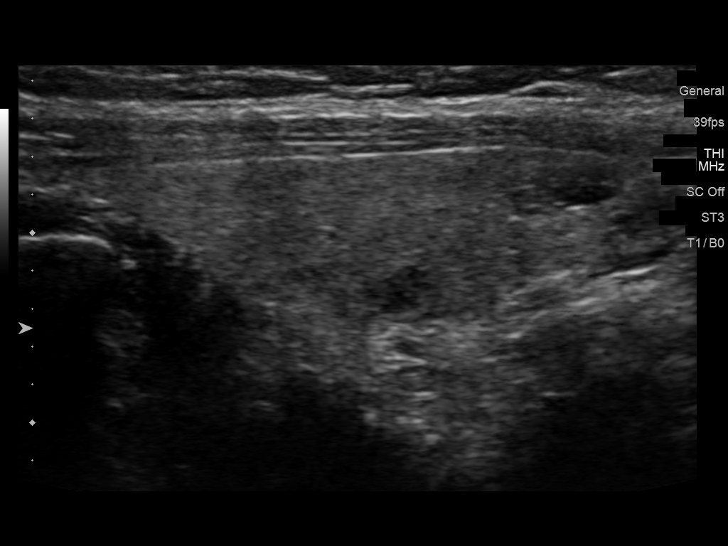
[im 42/48]
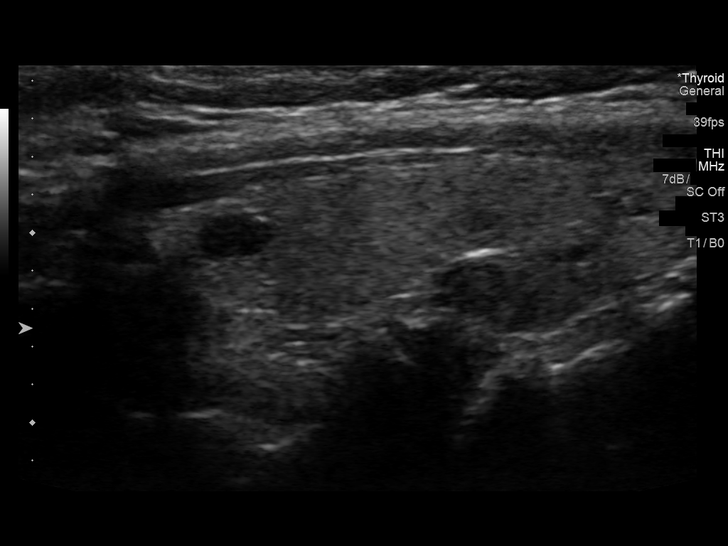
[im 46/48]
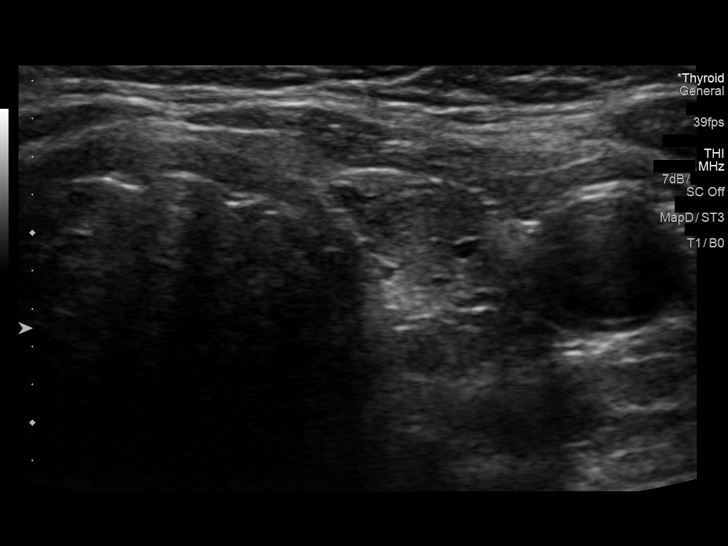

[12 of 25 positions shown; findings below may reference images not displayed]

FINDINGS: Parenchymal Echotexture: Mildly heterogenous

Isthmus: 0.2 cm

Right lobe: 3.5 cm x 1.1 cm x 1.1 cm

Left lobe: 4.0 cm x 1.5 cm by 1.1 cm

_________________________________________________________

Estimated total number of nodules >/= 1 cm: 0

Number of spongiform nodules >/=  2 cm not described below (TR1): 0

Number of mixed cystic and solid nodules >/= 1.5 cm not described
below (TR2): 0

_________________________________________________________

Nodule # 1:

Location: Right; Superior

Maximum size: 0.6 cm; Other 2 dimensions: 0.4 cm x 0.5 cm

Composition: solid/almost completely solid (2)

Echogenicity: isoechoic (1)

Shape: not taller-than-wide (0)

Margins: ill-defined (0)

Echogenic foci: none (0)

ACR TI-RADS total points: 3.

ACR TI-RADS risk category: TR3 (3 points).

ACR TI-RADS recommendations:

Nodule does not meet criteria for surveillance or biopsy

_________________________________________________________

Nodule # 2:

Location: Right; Mid

Maximum size: 0.6 cm; Other 2 dimensions: 0.5 cm x 0.4 cm

Composition: solid/almost completely solid (2)

Echogenicity: isoechoic (1)

Shape: not taller-than-wide (0)

Margins: ill-defined (0)

Echogenic foci: none (0)

ACR TI-RADS total points: 3.

ACR TI-RADS risk category: TR3 (3 points).

ACR TI-RADS recommendations:

Nodule does not meet criteria for surveillance or biopsy

_________________________________________________________

Nodule # 3:

Location: Left; Mid

Maximum size: 0.7 cm; Other 2 dimensions: 0.4 cm x 0.7 cm

Composition: solid/almost completely solid (2)

Echogenicity: isoechoic (1)

Shape: not taller-than-wide (0)

Margins: ill-defined (0)

Echogenic foci: none (0)

ACR TI-RADS total points: 3.

ACR TI-RADS risk category: TR3 (3 points).

ACR TI-RADS recommendations:

Nodule does not meet criteria for surveillance or biopsy

_________________________________________________________

Nodule # 4:

Location: Left; Inferior

Maximum size: 0.9 cm; Other 2 dimensions: 0.4 cm x 0.8 cm

Composition: spongiform (0)

ACR TI-RADS recommendations:

Spongiform nodule does not meet criteria for surveillance or biopsy

_________________________________________________________

Nodule # 5:

Location: Left; Inferior

Maximum size: Bb 0.8 cm; Other 2 dimensions: 0.6 cm x 0.7 cm

Composition: spongiform (0)

ACR TI-RADS recommendations:

Spongiform nodule does not meet criteria for surveillance or biopsy

_________________________________________________________

No adenopathy
IMPRESSION: No thyroid nodule meets criteria for biopsy or surveillance, as
designated by the newly established ACR TI-RADS criteria.

Recommendations follow those established by the new ACR TI-RADS
criteria ([HOSPITAL] 6536;[DATE]).

## 2017-12-25 DIAGNOSIS — H5440 Blindness, one eye, unspecified eye: Secondary | ICD-10-CM | POA: Diagnosis not present

## 2017-12-25 DIAGNOSIS — D329 Benign neoplasm of meninges, unspecified: Secondary | ICD-10-CM | POA: Diagnosis not present

## 2017-12-25 DIAGNOSIS — R9089 Other abnormal findings on diagnostic imaging of central nervous system: Secondary | ICD-10-CM | POA: Diagnosis not present

## 2017-12-25 DIAGNOSIS — Z923 Personal history of irradiation: Secondary | ICD-10-CM | POA: Diagnosis not present

## 2017-12-26 DIAGNOSIS — N951 Menopausal and female climacteric states: Secondary | ICD-10-CM | POA: Diagnosis not present

## 2017-12-26 DIAGNOSIS — N952 Postmenopausal atrophic vaginitis: Secondary | ICD-10-CM | POA: Diagnosis not present

## 2018-01-03 NOTE — Telephone Encounter (Signed)
error 

## 2018-01-07 DIAGNOSIS — M25569 Pain in unspecified knee: Secondary | ICD-10-CM | POA: Diagnosis not present

## 2018-01-07 DIAGNOSIS — M25571 Pain in right ankle and joints of right foot: Secondary | ICD-10-CM | POA: Diagnosis not present

## 2018-01-21 DIAGNOSIS — M25571 Pain in right ankle and joints of right foot: Secondary | ICD-10-CM | POA: Diagnosis not present

## 2018-01-21 DIAGNOSIS — M25569 Pain in unspecified knee: Secondary | ICD-10-CM | POA: Diagnosis not present

## 2018-01-29 DIAGNOSIS — Z9189 Other specified personal risk factors, not elsewhere classified: Secondary | ICD-10-CM | POA: Diagnosis not present

## 2018-02-05 ENCOUNTER — Encounter: Payer: Self-pay | Admitting: Family Medicine

## 2018-02-05 ENCOUNTER — Ambulatory Visit (INDEPENDENT_AMBULATORY_CARE_PROVIDER_SITE_OTHER): Payer: BLUE CROSS/BLUE SHIELD | Admitting: Family Medicine

## 2018-02-05 DIAGNOSIS — T63461A Toxic effect of venom of wasps, accidental (unintentional), initial encounter: Secondary | ICD-10-CM | POA: Diagnosis not present

## 2018-02-05 DIAGNOSIS — W57XXXA Bitten or stung by nonvenomous insect and other nonvenomous arthropods, initial encounter: Secondary | ICD-10-CM

## 2018-02-05 MED ORDER — EPINEPHRINE 0.15 MG/0.3ML IJ SOAJ: 0.1500 mg | INTRAMUSCULAR | 1 refills | Status: AC | PRN

## 2018-02-05 MED ORDER — METHYLPREDNISOLONE ACETATE 40 MG/ML IJ SUSP
40.0000 mg | Freq: Once | INTRAMUSCULAR | Status: AC
  Administered 2018-02-05: 40 mg via INTRAMUSCULAR

## 2018-02-05 MED ORDER — CEPHALEXIN 500 MG PO CAPS: 500.0000 mg | ORAL_CAPSULE | Freq: Two times a day (BID) | ORAL | 0 refills | Status: AC

## 2018-02-05 MED ORDER — TRIAMCINOLONE ACETONIDE 0.1 % EX CREA: 1.0000 "application " | TOPICAL_CREAM | Freq: Two times a day (BID) | CUTANEOUS | 0 refills | Status: AC

## 2018-02-05 NOTE — Patient Instructions (Addendum)
  [name] I have seen you today for an acute visit.  A few things to remember from today's visit:   Wasp sting, accidental or unintentional, initial encounter - Plan: cephALEXin (KEFLEX) 500 MG capsule  Reaction to insect bite - Plan: triamcinolone cream (KENALOG) 0.1 %  Antibiotic was sent to your pharmacy to be filled upon request, if edema and erythema gets worse in 2 to 3 days. Keep area clean with soap and water. Pepcid 20 mg twice daily. Zyrtec 10 mg twice daily for 7 to 10 days.   In general please monitor for signs of worsening symptoms and seek immediate medical attention if any concerning.  If symptoms are not greatly improved in 7-10 days you should schedule a follow up appointment with your doctor, before if needed.  I hope you get better soon!

## 2018-02-05 NOTE — Progress Notes (Signed)
ACUTE VISIT   HPI:  Chief Complaint  Patient presents with  . Insect Bite    2 days ago, left forearm, pain, redness, itchy and swelling    [name] is a 62 y.o. female, who is here today complaining of very pruritic, mildly tender, and erythematosus area on left forearm after insect bite. She has picture and seems to be wasps on a wasp nest.  On 02/03/2018 around 3:57 PM she was preparing to clean her all car, she opened the door and a couple of wasps came to her and stung her left arm. She washed area immediately and applied home remedies to help with pain. Then went back to take picture.  Initially she had pain on area, next day she started with intense pruritus. She is very concerned because local edema, "hot to touch" ; also erythema is getting worse and spreading towards her elbow.  She has tried OTC Benadryl and topical cortisone but the problem seems to be getting worse.  Yesterday morning severe upper abdominal pain, nausea, and a few episodes of diarrhea. These GI symptoms resolved. Yesterday she had some transient dysphasia. Negative for odynophagia, stridor, cough, wheezing, or chest pain. Denies fever or chills.  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 She has epinephrine in her allergy list, she states that she developed dizziness and tachycardia when she received it at her dentist office.   Review of Systems  Constitutional: Negative for appetite change, chills, fatigue and fever.  HENT: Negative for mouth sores, sore throat and voice change.   Respiratory: Negative for cough, shortness of breath and wheezing.   Cardiovascular: Negative for chest pain, palpitations and leg swelling.  Gastrointestinal: Negative for abdominal pain, blood in stool and vomiting.  Musculoskeletal: Negative for gait problem and myalgias.  Skin: Positive for rash. Negative for wound.  Allergic/Immunologic: Positive for environmental allergies.    Neurological: Negative for syncope, weakness, numbness and headaches.  Hematological: Negative for adenopathy. Does not bruise/bleed easily.  Psychiatric/Behavioral: Negative for confusion. The patient is nervous/anxious.      Current Outpatient Medications on File Prior to Visit  Medication Sig Dispense Refill  . amphetamine-dextroamphetamine (ADDERALL) 5 MG tablet Take 1 tablet (5 mg total) by mouth daily. 30 tablet 0  . buPROPion (WELLBUTRIN XL) 150 MG 24 hr tablet Take 1 tablet (150 mg total) by mouth daily. 30 tablet 5  . Cholecalciferol (VITAMIN D-3) 5000 UNITS TABS Take 1 tablet by mouth daily.     Marland Kitchen ESTRING 2 MG vaginal ring INSERT 1 RING VAGINALLY EVERY 3 MONTHS AS DIRECTED  0  . fish oil-omega-3 fatty acids 1000 MG capsule Take 2 g by mouth daily.      Marland Kitchen LORazepam (ATIVAN) 1 MG tablet as needed.  0  . Multiple Vitamin (MULTIVITAMIN WITH MINERALS) TABS Take 1 tablet by mouth daily.    . Nutritional Supplements (DHEA PO) Take 33 mg by mouth daily.     Marland Kitchen thyroid (NATURE-THROID) 32.5 MG tablet Nature-Throid 32.5 mg tablet    . vitamin C (ASCORBIC ACID) 500 MG tablet Take 1,000 mg by mouth daily.    . vitamin E 1000 UNIT capsule Take by mouth.     No current facility-administered medications on file prior to visit.      Past Medical History:  Diagnosis Date  . ADD (attention deficit disorder)   . Allergy    latex  . Anxiety   . Complication of anesthesia  Difficulty waking up  . Depression   . Dyspareunia, female 2006  . Excessive menses 2006  . H/O bladder infections   . H/O folliculitis 1941  . H/O mitral valve prolapse   . Hx of colectomy 2/15   diverticulitis   Dr Morton Stall  . Hx: UTI (urinary tract infection) 2009  . Hyperlipidemia   . Hypothyroidism   . IBS (irritable bowel syndrome)   . Kidney infection   . Knee pain   . Libido, decreased 2006  . 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 Menopausal symptoms 2006  . Optic neuritis 2009   Left  .  Osteoarthritis   . Osteopenia   . PMB (postmenopausal bleeding) 2006  . Renal stones 2006  . Urinary urgency 2009  . Vaginal atrophy 2007  . Vaginal dryness 2007  . Vaginal spotting 2006  . Vitamin D deficiency    Allergies  Allergen Reactions  . Aspirin Swelling  . Bee Venom Anaphylaxis  . Epinephrine Rash    Feels jittery and shaky, heart races Feels jittery and shaky, heart races, passed out   . Latex Rash    Rash, itching  . Alprazolam     REACTION: palpitations  . Ibuprofen Other (See Comments)  . Pregabalin     Other reaction(s): Other (See Comments) Visual Floaters  . Pristiq [Desvenlafaxine Succinate Monohydrate]     Weird feeling  . Progesterone     REACTION: depression and wt gain  . Propoxyphene N-Acetaminophen     REACTION: side affects  . Sulfa Antibiotics     rash  . Zithromax [Azithromycin]     Nausea, diarrhea  . Zolpidem Other (See Comments)    Bad reaction    Social History   Socioeconomic History  . Marital status: Married    Spouse name: Not on file  . Number of children: Not on file  . Years of education: Not on file  . Highest education level: Not on file  Occupational History  . Occupation: Not Working    Fish farm manager: SELF-EMPLOYED  Social Needs  . Financial resource strain: Not on file  . Food insecurity:    Worry: Not on file    Inability: Not on file  . Transportation needs:    Medical: Not on file    Non-medical: Not on file  Tobacco Use  . Smoking status: Former Smoker    Packs/day: 1.00    Years: 8.00    Pack years: 8.00    Types: Cigarettes    Last attempt to quit: 07/31/1983    Years since quitting: 34.5  . Smokeless tobacco: Never Used  Substance and Sexual Activity  . Alcohol use: Yes    Alcohol/week: 4.2 oz    Types: 7 Glasses of wine per week  . Drug use: No  . Sexual activity: Yes    Birth control/protection: None  Lifestyle  . Physical activity:    Days per week: Not on file    Minutes per session: Not on  file  . Stress: Not on file  Relationships  . Social connections:    Talks on phone: Not on file    Gets together: Not on file    Attends religious service: Not on file    Active member of club or organization: Not on file    Attends meetings of clubs or organizations: Not on file    Relationship status: Not on file  Other Topics Concern  . Not on file  Social  History Narrative  . Not on file    Vitals:   02/05/18 1539  BP: 110/70  Pulse: 66  Resp: 12  Temp: 98.4 F (36.9 C)  SpO2: 98%   Body mass index is 27.37 kg/m.   Physical Exam  Nursing note and vitals reviewed. Constitutional: She is oriented to person, place, and time. She appears well-developed and well-nourished. No distress.  HENT:  Head: Normocephalic and atraumatic.  Mouth/Throat: Oropharynx is clear and moist and mucous membranes are normal.  Eyes: Conjunctivae are normal.  Cardiovascular: Normal rate and regular rhythm.  Respiratory: Effort normal and breath sounds normal. No respiratory distress.  GI: Soft. She exhibits no mass. There is no hepatomegaly. There is no tenderness.  Musculoskeletal: She exhibits no edema.  Lymphadenopathy:    She has no cervical adenopathy.       Left: No supraclavicular adenopathy present.  Neurological: She is alert and oriented to person, place, and time. She has normal strength. Gait normal.  Skin: Skin is warm. Rash noted. There is erythema.     See picture.  Psychiatric: Her mood appears anxious.  Well groomed, good eye contact.     Comparing both forearms.   ASSESSMENT AND PLAN:   Ms. Jennifer [name] was seen today for insect bite.  Diagnoses and all orders for this visit:  Wasp sting, accidental or unintentional, initial encounter  After discussion of treatment options, she is not interested in oral steroids. She agrees with Depo-Medrol 40 mg IM here in the office. Instructed to continue Zyrtec 10 mg twice daily for 7 to 10 days. Pepcid 20 mg twice  daily. She was instructed about warning signs.  -     cephALEXin (KEFLEX) 500 MG capsule; Take 1 capsule (500 mg total) by mouth 2 (two) times daily for 7 days. -     methylPREDNISolone acetate (DEPO-MEDROL) injection 40 mg  Reaction to insect bite  At this time I do not think antibiotic treatment is necessary, we discussed some side effects of antibiotics. Cephalexin was sent to her pharmacy, recommend started taking it if problem gets worse or if not greatly improved in 48 to 72 hours. Topical steroid also recommended, triamcinolone twice daily for 14 days. Recommend keeping area clean with soap and water.  -     triamcinolone cream (KENALOG) 0.1 %; Apply 1 application topically 2 (two) times daily for 14 days. -     methylPREDNISolone acetate (DEPO-MEDROL) injection 40 mg  Other orders -     EPINEPHrine (EPIPEN JR 2-PAK) 0.15 MG/0.3ML injection; Inject 0.3 mLs (0.15 mg total) into the muscle as needed for anaphylaxis.     OV 3:49 pm to 4:39 pm.   25 min face to face OV. > 50% was dedicated to discussion of Dx, prognosis, treatment options, and some side effects of medications. Initially she refused any steroid treatment, she reports side effects with prednisone that included manic-like symptoms and weight gain.  Eventually while she was here she agreed with Depo-Medrol IM. She was clearly instructed about warning signs. She would like to have EpiPen but a lower dose, she states that she is "oversensitive to medications." She will follow-up with PCP as needed.  Return if symptoms worsen or fail to improve.   -[name] was advised to seek immediate medical attention if sudden worsening symptoms.      [name] G. Martinique, MD  [facility]. [place] office.

## 2018-02-10 DIAGNOSIS — H5340 Unspecified visual field defects: Secondary | ICD-10-CM | POA: Diagnosis not present

## 2018-02-10 DIAGNOSIS — H468 Other optic neuritis: Secondary | ICD-10-CM | POA: Diagnosis not present

## 2018-02-10 DIAGNOSIS — H43811 Vitreous degeneration, right eye: Secondary | ICD-10-CM | POA: Diagnosis not present

## 2018-02-10 DIAGNOSIS — D329 Benign neoplasm of meninges, unspecified: Secondary | ICD-10-CM | POA: Diagnosis not present

## 2018-02-11 ENCOUNTER — Ambulatory Visit: Payer: BLUE CROSS/BLUE SHIELD | Admitting: Family

## 2018-02-11 DIAGNOSIS — W57XXXA Bitten or stung by nonvenomous insect and other nonvenomous arthropods, initial encounter: Secondary | ICD-10-CM | POA: Diagnosis not present

## 2018-02-11 DIAGNOSIS — S50862A Insect bite (nonvenomous) of left forearm, initial encounter: Secondary | ICD-10-CM | POA: Diagnosis not present

## 2018-02-25 DIAGNOSIS — M25569 Pain in unspecified knee: Secondary | ICD-10-CM | POA: Diagnosis not present

## 2018-02-25 DIAGNOSIS — M25571 Pain in right ankle and joints of right foot: Secondary | ICD-10-CM | POA: Diagnosis not present

## 2018-03-06 DIAGNOSIS — E049 Nontoxic goiter, unspecified: Secondary | ICD-10-CM | POA: Diagnosis not present

## 2018-03-06 DIAGNOSIS — M810 Age-related osteoporosis without current pathological fracture: Secondary | ICD-10-CM | POA: Diagnosis not present

## 2018-03-06 DIAGNOSIS — N959 Unspecified menopausal and perimenopausal disorder: Secondary | ICD-10-CM | POA: Diagnosis not present

## 2018-03-06 DIAGNOSIS — R5381 Other malaise: Secondary | ICD-10-CM | POA: Diagnosis not present

## 2018-03-10 DIAGNOSIS — M25571 Pain in right ankle and joints of right foot: Secondary | ICD-10-CM | POA: Diagnosis not present

## 2018-03-10 DIAGNOSIS — M25569 Pain in unspecified knee: Secondary | ICD-10-CM | POA: Diagnosis not present

## 2018-03-11 ENCOUNTER — Ambulatory Visit: Payer: BLUE CROSS/BLUE SHIELD | Admitting: Internal Medicine

## 2018-03-12 ENCOUNTER — Ambulatory Visit: Payer: BLUE CROSS/BLUE SHIELD | Admitting: Internal Medicine

## 2018-03-20 DIAGNOSIS — R5383 Other fatigue: Secondary | ICD-10-CM | POA: Diagnosis not present

## 2018-03-20 DIAGNOSIS — R350 Frequency of micturition: Secondary | ICD-10-CM | POA: Diagnosis not present

## 2018-03-20 DIAGNOSIS — J309 Allergic rhinitis, unspecified: Secondary | ICD-10-CM | POA: Diagnosis not present

## 2018-03-20 DIAGNOSIS — N951 Menopausal and female climacteric states: Secondary | ICD-10-CM | POA: Diagnosis not present

## 2018-03-24 ENCOUNTER — Encounter: Payer: Self-pay | Admitting: Internal Medicine

## 2018-03-24 ENCOUNTER — Ambulatory Visit: Payer: BLUE CROSS/BLUE SHIELD | Admitting: Internal Medicine

## 2018-03-24 VITALS — BP 114/70 | HR 69 | Temp 98.0°F | Ht 63.0 in | Wt 150.0 lb

## 2018-03-24 DIAGNOSIS — F900 Attention-deficit hyperactivity disorder, predominantly inattentive type: Secondary | ICD-10-CM

## 2018-03-24 DIAGNOSIS — E559 Vitamin D deficiency, unspecified: Secondary | ICD-10-CM

## 2018-03-24 DIAGNOSIS — E538 Deficiency of other specified B group vitamins: Secondary | ICD-10-CM

## 2018-03-24 MED ORDER — AMPHETAMINE-DEXTROAMPHETAMINE 5 MG PO TABS: 5.0000 mg | ORAL_TABLET | Freq: Every day | ORAL | 0 refills | Status: DC

## 2018-03-24 NOTE — Assessment & Plan Note (Signed)
Vit D 

## 2018-03-24 NOTE — Assessment & Plan Note (Signed)
Low dose adderall  Potential benefits of a long term adderall use as well as potential risks  and complications were explained to the patient and were aknowledged.

## 2018-03-24 NOTE — Assessment & Plan Note (Signed)
Off wellbutrin

## 2018-03-24 NOTE — Progress Notes (Signed)
Subjective:  Patient ID: Jennifer [name], female    DOB: [birth year]  Age: 62 y.o. MRN: [id number]  CC: No chief complaint on file.   HPI Jennifer [name] presents for somnolence and ADD  Outpatient Medications Prior to Visit  Medication Sig Dispense Refill  . amphetamine-dextroamphetamine (ADDERALL) 5 MG tablet Take 1 tablet (5 mg total) by mouth daily. 30 tablet 0  . Cholecalciferol (VITAMIN D-3) 5000 UNITS TABS Take 1 tablet by mouth daily.     Marland Kitchen EPINEPHrine (EPIPEN JR 2-PAK) 0.15 MG/0.3ML injection Inject 0.3 mLs (0.15 mg total) into the muscle as needed for anaphylaxis. 2 each 1  . ESTRING 2 MG vaginal ring INSERT 1 RING VAGINALLY EVERY 3 MONTHS AS DIRECTED  0  . fish oil-omega-3 fatty acids 1000 MG capsule Take 2 g by mouth daily.      Marland Kitchen LORazepam (ATIVAN) 1 MG tablet as needed.  0  . Multiple Vitamin (MULTIVITAMIN WITH MINERALS) TABS Take 1 tablet by mouth daily.    . Nutritional Supplements (DHEA PO) Take 33 mg by mouth daily.     Marland Kitchen thyroid (NATURE-THROID) 32.5 MG tablet Nature-Throid 32.5 mg tablet    . vitamin C (ASCORBIC ACID) 500 MG tablet Take 1,000 mg by mouth daily.    . vitamin E 1000 UNIT capsule Take by mouth.    Marland Kitchen buPROPion (WELLBUTRIN XL) 150 MG 24 hr tablet Take 1 tablet (150 mg total) by mouth daily. 30 tablet 5   No facility-administered medications prior to visit.     ROS: Review of Systems  Constitutional: Positive for fatigue. Negative for activity change, appetite change, chills and unexpected weight change.  HENT: Negative for congestion, mouth sores and sinus pressure.   Eyes: Positive for visual disturbance.  Respiratory: Negative for cough and chest tightness.   Gastrointestinal: Negative for abdominal pain and nausea.  Genitourinary: Negative for difficulty urinating, frequency and vaginal pain.  Musculoskeletal: Negative for back pain and gait problem.  Skin: Negative for pallor and rash.  Neurological: Negative for dizziness, tremors,  weakness, numbness and headaches.  Psychiatric/Behavioral: Positive for decreased concentration and sleep disturbance. Negative for confusion and suicidal ideas.    Objective:  BP 114/70 (BP Location: Left Arm, Patient Position: Sitting, Cuff Size: Normal)   Pulse 69   Temp 98 F (36.7 C) (Oral)   Ht 5\' 3"  (1.6 m)   Wt 150 lb (68 kg)   LMP 02/13/2007   SpO2 95%   BMI 26.57 kg/m   BP Readings from Last 3 Encounters:  03/24/18 114/70  02/05/18 110/70  12/09/17 126/76    Wt Readings from Last 3 Encounters:  03/24/18 150 lb (68 kg)  02/05/18 154 lb 8 oz (70.1 kg)  12/09/17 152 lb (68.9 kg)    Physical Exam  Constitutional: She appears well-developed. No distress.  HENT:  Head: Normocephalic.  Right Ear: External ear normal.  Left Ear: External ear normal.  Nose: Nose normal.  Mouth/Throat: Oropharynx is clear and moist.  Eyes: Pupils are equal, round, and reactive to light. Conjunctivae are normal. Right eye exhibits no discharge. Left eye exhibits no discharge.  Neck: Normal range of motion. Neck supple. No JVD present. No tracheal deviation present. No thyromegaly present.  Cardiovascular: Normal rate, regular rhythm and normal heart sounds.  Pulmonary/Chest: No stridor. No respiratory distress. She has no wheezes.  Abdominal: Soft. Bowel sounds are normal. She exhibits no distension and no mass. There is no tenderness. There is no rebound and no guarding.  Musculoskeletal:  She exhibits no edema or tenderness.  Lymphadenopathy:    She has no cervical adenopathy.  Neurological: She displays normal reflexes. No cranial nerve deficit. She exhibits normal muscle tone. Coordination normal.  Skin: No rash noted. No erythema.  Psychiatric: She has a normal mood and affect. Her behavior is normal. Judgment and thought content normal.    Lab Results  Component Value Date   WBC 7.4 09/17/2017   HGB 14.6 09/17/2017   HCT 43.6 09/17/2017   PLT 333.0 09/17/2017   GLUCOSE 95  11/19/2014   CHOL 287 (H) 01/25/2011   TRIG 70.0 01/25/2011   HDL 96.50 01/25/2011   LDLDIRECT 202.5 01/25/2011   ALT 14 11/19/2014   AST 16 11/19/2014   NA 140 11/19/2014   K 4.5 11/19/2014   CL 104 11/19/2014   CREATININE 0.81 11/19/2014   BUN 17 11/19/2014   CO2 32 11/19/2014   TSH 1.58 11/19/2014   HGBA1C 5.7 04/10/2007    No results found.  Assessment & Plan:   There are no diagnoses linked to this encounter.   No orders of the defined types were placed in this encounter.    Follow-up: No follow-ups on file.  Walker Kehr, MD

## 2018-03-24 NOTE — Assessment & Plan Note (Signed)
On b12 

## 2018-04-03 DIAGNOSIS — Z1211 Encounter for screening for malignant neoplasm of colon: Secondary | ICD-10-CM | POA: Diagnosis not present

## 2018-04-03 DIAGNOSIS — N952 Postmenopausal atrophic vaginitis: Secondary | ICD-10-CM | POA: Diagnosis not present

## 2018-04-08 DIAGNOSIS — M25569 Pain in unspecified knee: Secondary | ICD-10-CM | POA: Diagnosis not present

## 2018-04-08 DIAGNOSIS — M25571 Pain in right ankle and joints of right foot: Secondary | ICD-10-CM | POA: Diagnosis not present

## 2018-05-19 DIAGNOSIS — N951 Menopausal and female climacteric states: Secondary | ICD-10-CM | POA: Diagnosis not present

## 2018-05-19 DIAGNOSIS — J309 Allergic rhinitis, unspecified: Secondary | ICD-10-CM | POA: Diagnosis not present

## 2018-05-19 DIAGNOSIS — R5383 Other fatigue: Secondary | ICD-10-CM | POA: Diagnosis not present

## 2018-05-19 DIAGNOSIS — R350 Frequency of micturition: Secondary | ICD-10-CM | POA: Diagnosis not present

## 2018-05-20 DIAGNOSIS — M25569 Pain in unspecified knee: Secondary | ICD-10-CM | POA: Diagnosis not present

## 2018-05-20 DIAGNOSIS — M25571 Pain in right ankle and joints of right foot: Secondary | ICD-10-CM | POA: Diagnosis not present

## 2018-05-22 DIAGNOSIS — Z1211 Encounter for screening for malignant neoplasm of colon: Secondary | ICD-10-CM | POA: Diagnosis not present

## 2018-05-22 DIAGNOSIS — R194 Change in bowel habit: Secondary | ICD-10-CM | POA: Diagnosis not present

## 2018-05-22 DIAGNOSIS — R140 Abdominal distension (gaseous): Secondary | ICD-10-CM | POA: Diagnosis not present

## 2018-06-30 ENCOUNTER — Ambulatory Visit: Payer: BLUE CROSS/BLUE SHIELD | Admitting: Internal Medicine

## 2018-07-14 DIAGNOSIS — N952 Postmenopausal atrophic vaginitis: Secondary | ICD-10-CM | POA: Diagnosis not present

## 2018-08-06 ENCOUNTER — Ambulatory Visit: Payer: BLUE CROSS/BLUE SHIELD | Admitting: Internal Medicine

## 2018-08-12 DIAGNOSIS — R5383 Other fatigue: Secondary | ICD-10-CM | POA: Diagnosis not present

## 2018-08-12 DIAGNOSIS — N951 Menopausal and female climacteric states: Secondary | ICD-10-CM | POA: Diagnosis not present

## 2018-08-12 DIAGNOSIS — J309 Allergic rhinitis, unspecified: Secondary | ICD-10-CM | POA: Diagnosis not present

## 2018-08-14 ENCOUNTER — Other Ambulatory Visit: Payer: Self-pay | Admitting: Family Medicine

## 2018-08-14 DIAGNOSIS — Z1211 Encounter for screening for malignant neoplasm of colon: Secondary | ICD-10-CM | POA: Diagnosis not present

## 2018-08-14 DIAGNOSIS — Z01419 Encounter for gynecological examination (general) (routine) without abnormal findings: Secondary | ICD-10-CM | POA: Diagnosis not present

## 2018-08-14 DIAGNOSIS — Z124 Encounter for screening for malignant neoplasm of cervix: Secondary | ICD-10-CM | POA: Diagnosis not present

## 2018-08-14 DIAGNOSIS — E049 Nontoxic goiter, unspecified: Secondary | ICD-10-CM

## 2018-08-14 DIAGNOSIS — R350 Frequency of micturition: Secondary | ICD-10-CM | POA: Diagnosis not present

## 2018-08-14 DIAGNOSIS — Z6829 Body mass index (BMI) 29.0-29.9, adult: Secondary | ICD-10-CM | POA: Diagnosis not present

## 2018-08-14 DIAGNOSIS — Z1231 Encounter for screening mammogram for malignant neoplasm of breast: Secondary | ICD-10-CM | POA: Diagnosis not present

## 2018-08-21 ENCOUNTER — Encounter

## 2018-08-21 ENCOUNTER — Ambulatory Visit (INDEPENDENT_AMBULATORY_CARE_PROVIDER_SITE_OTHER): Payer: BLUE CROSS/BLUE SHIELD | Admitting: Internal Medicine

## 2018-08-21 ENCOUNTER — Encounter: Payer: Self-pay | Admitting: Internal Medicine

## 2018-08-21 DIAGNOSIS — F900 Attention-deficit hyperactivity disorder, predominantly inattentive type: Secondary | ICD-10-CM | POA: Diagnosis not present

## 2018-08-21 DIAGNOSIS — E559 Vitamin D deficiency, unspecified: Secondary | ICD-10-CM

## 2018-08-21 DIAGNOSIS — E538 Deficiency of other specified B group vitamins: Secondary | ICD-10-CM | POA: Diagnosis not present

## 2018-08-21 DIAGNOSIS — G4700 Insomnia, unspecified: Secondary | ICD-10-CM | POA: Diagnosis not present

## 2018-08-21 MED ORDER — AMPHETAMINE-DEXTROAMPHETAMINE 5 MG PO TABS: 5.0000 mg | ORAL_TABLET | Freq: Two times a day (BID) | ORAL | 0 refills | Status: DC

## 2018-08-21 MED ORDER — AMPHETAMINE-DEXTROAMPHETAMINE 5 MG PO TABS
5.0000 mg | ORAL_TABLET | Freq: Two times a day (BID) | ORAL | 0 refills | Status: DC
Start: 2018-08-21 — End: 2018-09-03

## 2018-08-21 NOTE — Assessment & Plan Note (Addendum)
Low dose adderall - start  Bid due to sub opimal sx's control  Potential benefits of a long term adderall use as well as potential risks  and complications were explained to the patient and were aknowledged.

## 2018-08-21 NOTE — Patient Instructions (Signed)
   Weighted blanket    Cardiac CT calcium scoring test $150   Computed tomography, more commonly known as a CT or CAT scan, is a diagnostic medical imaging test. Like traditional x-rays, it produces multiple images or pictures of the inside of the body. The cross-sectional images generated during a CT scan can be reformatted in multiple planes. They can even generate three-dimensional images. These images can be viewed on a computer monitor, printed on film or by a 3D printer, or transferred to a CD or DVD. CT images of internal organs, bones, soft tissue and blood vessels provide greater detail than traditional x-rays, particularly of soft tissues and blood vessels. A cardiac CT scan for coronary calcium is a non-invasive way of obtaining information about the presence, location and extent of calcified plaque in the coronary arteries-the vessels that supply oxygen-containing blood to the heart muscle. Calcified plaque results when there is a build-up of fat and other substances under the inner layer of the artery. This material can calcify which signals the presence of atherosclerosis, a disease of the vessel wall, also called coronary artery disease (CAD). People with this disease have an increased risk for heart attacks. In addition, over time, progression of plaque build up (CAD) can narrow the arteries or even close off blood flow to the heart. The result may be chest pain, sometimes called "angina," or a heart attack. Because calcium is a marker of CAD, the amount of calcium detected on a cardiac CT scan is a helpful prognostic tool. The findings on cardiac CT are expressed as a calcium score. Another name for this test is coronary artery calcium scoring.  What are some common uses of the procedure? The goal of cardiac CT scan for calcium scoring is to determine if CAD is present and to what extent, even if there are no symptoms. It is a screening study that may be recommended by a physician for  patients with risk factors for CAD but no clinical symptoms. The major risk factors for CAD are: . high blood cholesterol levels  . family history of heart attacks  . diabetes  . high blood pressure  . cigarette smoking  . overweight or obese  . physical inactivity   A negative cardiac CT scan for calcium scoring shows no calcification within the coronary arteries. This suggests that CAD is absent or so minimal it cannot be seen by this technique. The chance of having a heart attack over the next two to five years is very low under these circumstances. A positive test means that CAD is present, regardless of whether or not the patient is experiencing any symptoms. The amount of calcification-expressed as the calcium score-may help to predict the likelihood of a myocardial infarction (heart attack) in the coming years and helps your medical doctor or cardiologist decide whether the patient may need to take preventive medicine or undertake other measures such as diet and exercise to lower the risk for heart attack. The extent of CAD is graded according to your calcium score:  Calcium Score  Presence of CAD  0 No evidence of CAD   1-10 Minimal evidence of CAD  11-100 Mild evidence of CAD  101-400 Moderate evidence of CAD  Over 400 Extensive evidence of CAD

## 2018-08-21 NOTE — Assessment & Plan Note (Signed)
On B12 

## 2018-08-21 NOTE — Progress Notes (Signed)
Subjective:  Patient ID: Jennifer [name], female    DOB: 1956-06-10  Age: 63 y.o. MRN: [id number]  CC: No chief complaint on file.   HPI Larcenia Holaday presents for ADD - sub optimal control, sleep disorder C/o wt gain  Outpatient Medications Prior to Visit  Medication Sig Dispense Refill  . amphetamine-dextroamphetamine (ADDERALL) 5 MG tablet Take 1 tablet (5 mg total) by mouth daily. 30 tablet 0  . Cholecalciferol (VITAMIN D-3) 5000 UNITS TABS Take 1 tablet by mouth daily.     Marland Kitchen EPINEPHrine (EPIPEN JR 2-PAK) 0.15 MG/0.3ML injection Inject 0.3 mLs (0.15 mg total) into the muscle as needed for anaphylaxis. 2 each 1  . ESTRING 2 MG vaginal ring INSERT 1 RING VAGINALLY EVERY 3 MONTHS AS DIRECTED  0  . fish oil-omega-3 fatty acids 1000 MG capsule Take 2 g by mouth daily.      Marland Kitchen LORazepam (ATIVAN) 1 MG tablet as needed.  0  . Multiple Vitamin (MULTIVITAMIN WITH MINERALS) TABS Take 1 tablet by mouth daily.    . Nutritional Supplements (DHEA PO) Take 33 mg by mouth daily.     Marland Kitchen thyroid (NATURE-THROID) 32.5 MG tablet Nature-Throid 32.5 mg tablet    . vitamin C (ASCORBIC ACID) 500 MG tablet Take 1,000 mg by mouth daily.    . vitamin E 1000 UNIT capsule Take by mouth.     No facility-administered medications prior to visit.     ROS: Review of Systems  Constitutional: Negative for activity change, appetite change, chills, fatigue and unexpected weight change.  HENT: Negative for congestion, mouth sores and sinus pressure.   Eyes: Negative for visual disturbance.  Respiratory: Negative for cough and chest tightness.   Gastrointestinal: Negative for abdominal pain and nausea.  Genitourinary: Negative for difficulty urinating, frequency and vaginal pain.  Musculoskeletal: Negative for back pain and gait problem.  Skin: Negative for pallor and rash.  Neurological: Negative for dizziness, tremors, weakness, numbness and headaches.  Psychiatric/Behavioral: Negative for confusion and sleep  disturbance. The patient is nervous/anxious.     Objective:  BP 120/82 (BP Location: Left Arm, Patient Position: Sitting, Cuff Size: Normal)   Pulse 66   Temp 98.2 F (36.8 C) (Oral)   Ht 5\' 3"  (1.6 m)   Wt 159 lb (72.1 kg)   LMP 02/13/2007   SpO2 98%   BMI 28.17 kg/m   BP Readings from Last 3 Encounters:  08/21/18 120/82  03/24/18 114/70  02/05/18 110/70    Wt Readings from Last 3 Encounters:  08/21/18 159 lb (72.1 kg)  03/24/18 150 lb (68 kg)  02/05/18 154 lb 8 oz (70.1 kg)    Physical Exam Constitutional:      General: She is not in acute distress.    Appearance: She is well-developed.  HENT:     Head: Normocephalic.     Right Ear: External ear normal.     Left Ear: External ear normal.     Nose: Nose normal.  Eyes:     General:        Right eye: No discharge.        Left eye: No discharge.     Conjunctiva/sclera: Conjunctivae normal.     Pupils: Pupils are equal, round, and reactive to light.  Neck:     Musculoskeletal: Normal range of motion and neck supple.     Thyroid: No thyromegaly.     Vascular: No JVD.     Trachea: No tracheal deviation.  Cardiovascular:  Rate and Rhythm: Normal rate and regular rhythm.     Heart sounds: Normal heart sounds.  Pulmonary:     Effort: No respiratory distress.     Breath sounds: No stridor. No wheezing.  Abdominal:     General: Bowel sounds are normal. There is no distension.     Palpations: Abdomen is soft. There is no mass.     Tenderness: There is no abdominal tenderness. There is no guarding or rebound.  Musculoskeletal:        General: No tenderness.  Lymphadenopathy:     Cervical: No cervical adenopathy.  Skin:    Findings: No erythema or rash.  Neurological:     Cranial Nerves: No cranial nerve deficit.     Motor: No abnormal muscle tone.     Coordination: Coordination normal.     Deep Tendon Reflexes: Reflexes normal.  Psychiatric:        Behavior: Behavior normal.        Thought Content: Thought  content normal.        Judgment: Judgment normal.     Lab Results  Component Value Date   WBC 7.4 09/17/2017   HGB 14.6 09/17/2017   HCT 43.6 09/17/2017   PLT 333.0 09/17/2017   GLUCOSE 95 11/19/2014   CHOL 287 (H) 01/25/2011   TRIG 70.0 01/25/2011   HDL 96.50 01/25/2011   LDLDIRECT 202.5 01/25/2011   ALT 14 11/19/2014   AST 16 11/19/2014   NA 140 11/19/2014   K 4.5 11/19/2014   CL 104 11/19/2014   CREATININE 0.81 11/19/2014   BUN 17 11/19/2014   CO2 32 11/19/2014   TSH 1.58 11/19/2014   HGBA1C 5.7 04/10/2007    No results found.  Assessment & Plan:   There are no diagnoses linked to this encounter.   No orders of the defined types were placed in this encounter.    Follow-up: No follow-ups on file.  Walker Kehr, MD

## 2018-08-24 NOTE — Assessment & Plan Note (Signed)
Vit D 

## 2018-08-24 NOTE — Assessment & Plan Note (Signed)
Discussed.

## 2018-08-25 ENCOUNTER — Ambulatory Visit
Admission: RE | Admit: 2018-08-25 | Discharge: 2018-08-25 | Disposition: A | Discharge status: Home / Self Care | Payer: BLUE CROSS/BLUE SHIELD | Source: Ambulatory Visit | Attending: Family Medicine | Admitting: Family Medicine

## 2018-08-25 DIAGNOSIS — E049 Nontoxic goiter, unspecified: Secondary | ICD-10-CM

## 2018-08-25 DIAGNOSIS — E042 Nontoxic multinodular goiter: Secondary | ICD-10-CM | POA: Diagnosis not present

## 2018-09-03 ENCOUNTER — Encounter: Payer: Self-pay | Admitting: Internal Medicine

## 2018-09-03 ENCOUNTER — Ambulatory Visit: Payer: BLUE CROSS/BLUE SHIELD | Admitting: Internal Medicine

## 2018-09-03 ENCOUNTER — Ambulatory Visit (INDEPENDENT_AMBULATORY_CARE_PROVIDER_SITE_OTHER)
Admission: RE | Admit: 2018-09-03 | Discharge: 2018-09-03 | Disposition: A | Discharge status: Home / Self Care | Payer: BLUE CROSS/BLUE SHIELD | Source: Ambulatory Visit | Attending: Internal Medicine | Admitting: Internal Medicine

## 2018-09-03 VITALS — BP 134/76 | HR 74 | Temp 97.9°F | Ht 63.0 in | Wt 160.0 lb

## 2018-09-03 DIAGNOSIS — J209 Acute bronchitis, unspecified: Secondary | ICD-10-CM

## 2018-09-03 DIAGNOSIS — F900 Attention-deficit hyperactivity disorder, predominantly inattentive type: Secondary | ICD-10-CM | POA: Diagnosis not present

## 2018-09-03 DIAGNOSIS — R05 Cough: Secondary | ICD-10-CM | POA: Diagnosis not present

## 2018-09-03 MED ORDER — AMPHETAMINE-DEXTROAMPHETAMINE 5 MG PO TABS: 5.0000 mg | ORAL_TABLET | Freq: Two times a day (BID) | ORAL | 0 refills | Status: DC

## 2018-09-03 MED ORDER — HYDROCODONE-HOMATROPINE 5-1.5 MG/5ML PO SYRP: 5.0000 mL | ORAL_SOLUTION | Freq: Four times a day (QID) | ORAL | 0 refills | Status: DC | PRN

## 2018-09-03 MED ORDER — CEFDINIR 300 MG PO CAPS: 300.0000 mg | ORAL_CAPSULE | Freq: Two times a day (BID) | ORAL | 0 refills | Status: DC

## 2018-09-03 MED ORDER — FLUTICASONE FUROATE-VILANTEROL 100-25 MCG/INH IN AEPB: 1.0000 | INHALATION_SPRAY | Freq: Every day | RESPIRATORY_TRACT | 5 refills | Status: DC

## 2018-09-03 NOTE — Assessment & Plan Note (Addendum)
Youa is having air ducts cleaned  Breo Hycodan Omnicef CXR

## 2018-09-03 NOTE — Patient Instructions (Addendum)
[email or web address].com  You can use over-the-counter  "cold" medicines  such as "Tylenol cold" , "Advil cold",  "Mucinex" or" Mucinex D"  for cough and congestion.   Avoid decongestants if you have high blood pressure and use "Afrin" nasal spray for nasal congestion as directed. Use " Delsym" or" Robitussin" cough syrup varietis for cough.  You can use plain "Tylenol" or "Advil" for fever, chills and achyness. Use Halls or Ricola cough drops.    Please, make an appointment if you are not better or if you're worse.

## 2018-09-03 NOTE — Assessment & Plan Note (Signed)
Adderall Rx x3 emailed to [facility]

## 2018-09-03 NOTE — Progress Notes (Signed)
Subjective:  Patient ID: Jennifer [name], female    DOB: [birth year]  Age: 63 y.o. MRN: [id number]  CC: No chief complaint on file.   HPI [name] presents for cough, wheezing, fatigue x 1 week ?mold in air ducts F/u ADD - [facility] has a generic brand the pt would like to take  Outpatient Medications Prior to Visit  Medication Sig Dispense Refill  . Cholecalciferol (VITAMIN D-3) 5000 UNITS TABS Take 1 tablet by mouth daily.     Marland Kitchen EPINEPHrine (EPIPEN JR 2-PAK) 0.15 MG/0.3ML injection Inject 0.3 mLs (0.15 mg total) into the muscle as needed for anaphylaxis. 2 each 1  . ESTRING 2 MG vaginal ring INSERT 1 RING VAGINALLY EVERY 3 MONTHS AS DIRECTED  0  . fish oil-omega-3 fatty acids 1000 MG capsule Take 2 g by mouth daily.      Marland Kitchen LORazepam (ATIVAN) 1 MG tablet as needed.  0  . Multiple Vitamin (MULTIVITAMIN WITH MINERALS) TABS Take 1 tablet by mouth daily.    . Nutritional Supplements (DHEA PO) Take 33 mg by mouth daily.     Marland Kitchen thyroid (NATURE-THROID) 32.5 MG tablet Nature-Throid 32.5 mg tablet    . vitamin C (ASCORBIC ACID) 500 MG tablet Take 1,000 mg by mouth daily.    . vitamin E 1000 UNIT capsule Take by mouth.    Marland Kitchen amphetamine-dextroamphetamine (ADDERALL) 5 MG tablet Take 1 tablet (5 mg total) by mouth 2 (two) times daily. AM and lunch 60 tablet 0  . amphetamine-dextroamphetamine (ADDERALL) 5 MG tablet Take 1 tablet (5 mg total) by mouth 2 (two) times daily. AM and lunch 60 tablet 0  . amphetamine-dextroamphetamine (ADDERALL) 5 MG tablet Take 1 tablet (5 mg total) by mouth 2 (two) times daily. AM and lunch 60 tablet 0   No facility-administered medications prior to visit.     ROS: Review of Systems  Constitutional: Positive for fatigue. Negative for activity change, appetite change, chills and unexpected weight change.  HENT: Positive for postnasal drip, sinus pressure and voice change. Negative for congestion and mouth sores.   Eyes: Negative for visual disturbance.    Respiratory: Positive for cough, shortness of breath and wheezing. Negative for chest tightness.   Gastrointestinal: Negative for abdominal pain and nausea.  Genitourinary: Negative for difficulty urinating, frequency and vaginal pain.  Musculoskeletal: Negative for back pain and gait problem.  Skin: Negative for pallor and rash.  Neurological: Negative for dizziness, tremors, weakness, numbness and headaches.  Psychiatric/Behavioral: Positive for decreased concentration and sleep disturbance. Negative for confusion and suicidal ideas. The patient is nervous/anxious.     Objective:  BP 134/76 (BP Location: Left Arm, Patient Position: Sitting, Cuff Size: Normal)   Pulse 74   Temp 97.9 F (36.6 C) (Oral)   Ht 5\' 3"  (1.6 m)   Wt 160 lb (72.6 kg)   LMP 02/13/2007   SpO2 97%   BMI 28.34 kg/m   BP Readings from Last 3 Encounters:  09/03/18 134/76  08/21/18 120/82  03/24/18 114/70    Wt Readings from Last 3 Encounters:  09/03/18 160 lb (72.6 kg)  08/21/18 159 lb (72.1 kg)  03/24/18 150 lb (68 kg)    Physical Exam Constitutional:      General: She is not in acute distress.    Appearance: She is well-developed.  HENT:     Head: Normocephalic.     Right Ear: External ear normal.     Left Ear: External ear normal.     Nose:  Nose normal.  Eyes:     General:        Right eye: No discharge.        Left eye: No discharge.     Conjunctiva/sclera: Conjunctivae normal.     Pupils: Pupils are equal, round, and reactive to light.  Neck:     Musculoskeletal: Normal range of motion and neck supple.     Thyroid: No thyromegaly.     Vascular: No JVD.     Trachea: No tracheal deviation.  Cardiovascular:     Rate and Rhythm: Normal rate and regular rhythm.     Heart sounds: Normal heart sounds.  Pulmonary:     Effort: No respiratory distress.     Breath sounds: No stridor. Rhonchi present. No wheezing.  Abdominal:     General: Bowel sounds are normal. There is no distension.      Palpations: Abdomen is soft. There is no mass.     Tenderness: There is no abdominal tenderness. There is no guarding or rebound.  Musculoskeletal:        General: No tenderness.  Lymphadenopathy:     Cervical: No cervical adenopathy.  Skin:    Findings: No erythema or rash.  Neurological:     Cranial Nerves: No cranial nerve deficit.     Motor: No abnormal muscle tone.     Coordination: Coordination normal.     Deep Tendon Reflexes: Reflexes normal.  Psychiatric:        Behavior: Behavior normal.        Thought Content: Thought content normal.        Judgment: Judgment normal.   I personally provided BREO inhaler use teaching. After the teaching patient was able to demonstrate it's use effectively. All questions were answered   Lab Results  Component Value Date   WBC 7.4 09/17/2017   HGB 14.6 09/17/2017   HCT 43.6 09/17/2017   PLT 333.0 09/17/2017   GLUCOSE 95 11/19/2014   CHOL 287 (H) 01/25/2011   TRIG 70.0 01/25/2011   HDL 96.50 01/25/2011   LDLDIRECT 202.5 01/25/2011   ALT 14 11/19/2014   AST 16 11/19/2014   NA 140 11/19/2014   K 4.5 11/19/2014   CL 104 11/19/2014   CREATININE 0.81 11/19/2014   BUN 17 11/19/2014   CO2 32 11/19/2014   TSH 1.58 11/19/2014   HGBA1C 5.7 04/10/2007    No results found.  Assessment & Plan:   Diagnoses and all orders for this visit:  Acute bronchitis, unspecified organism -     DG Chest 2 View  Attention deficit hyperactivity disorder (ADHD), predominantly inattentive type  Other orders -     cefdinir (OMNICEF) 300 MG capsule; Take 1 capsule (300 mg total) by mouth 2 (two) times daily. -     HYDROcodone-homatropine (HYCODAN) 5-1.5 MG/5ML syrup; Take 5 mLs by mouth every 6 (six) hours as needed for cough. -     fluticasone furoate-vilanterol (BREO ELLIPTA) 100-25 MCG/INH AEPB; Inhale 1 puff into the lungs daily. -     amphetamine-dextroamphetamine (ADDERALL) 5 MG tablet; Take 1 tablet (5 mg total) by mouth 2 (two) times daily.  AM and lunch -     amphetamine-dextroamphetamine (ADDERALL) 5 MG tablet; Take 1 tablet (5 mg total) by mouth 2 (two) times daily. AM and lunch -     amphetamine-dextroamphetamine (ADDERALL) 5 MG tablet; Take 1 tablet (5 mg total) by mouth 2 (two) times daily. AM and lunch     Meds ordered this encounter  Medications  . cefdinir (OMNICEF) 300 MG capsule    Sig: Take 1 capsule (300 mg total) by mouth 2 (two) times daily.    Dispense:  20 capsule    Refill:  0  . HYDROcodone-homatropine (HYCODAN) 5-1.5 MG/5ML syrup    Sig: Take 5 mLs by mouth every 6 (six) hours as needed for cough.    Dispense:  120 mL    Refill:  0  . fluticasone furoate-vilanterol (BREO ELLIPTA) 100-25 MCG/INH AEPB    Sig: Inhale 1 puff into the lungs daily.    Dispense:  1 each    Refill:  5  . amphetamine-dextroamphetamine (ADDERALL) 5 MG tablet    Sig: Take 1 tablet (5 mg total) by mouth 2 (two) times daily. AM and lunch    Dispense:  60 tablet    Refill:  0    Please fill on or after 08/21/18  . amphetamine-dextroamphetamine (ADDERALL) 5 MG tablet    Sig: Take 1 tablet (5 mg total) by mouth 2 (two) times daily. AM and lunch    Dispense:  60 tablet    Refill:  0    Please fill on or after 09/21/18  . amphetamine-dextroamphetamine (ADDERALL) 5 MG tablet    Sig: Take 1 tablet (5 mg total) by mouth 2 (two) times daily. AM and lunch    Dispense:  60 tablet    Refill:  0    Please fill on or after 10/20/18     Follow-up: No follow-ups on file.  Walker Kehr, MD

## 2018-09-16 DIAGNOSIS — L820 Inflamed seborrheic keratosis: Secondary | ICD-10-CM | POA: Diagnosis not present

## 2018-09-16 DIAGNOSIS — L814 Other melanin hyperpigmentation: Secondary | ICD-10-CM | POA: Diagnosis not present

## 2018-09-16 DIAGNOSIS — L821 Other seborrheic keratosis: Secondary | ICD-10-CM | POA: Diagnosis not present

## 2018-09-16 DIAGNOSIS — L570 Actinic keratosis: Secondary | ICD-10-CM | POA: Diagnosis not present

## 2018-09-16 DIAGNOSIS — Z85828 Personal history of other malignant neoplasm of skin: Secondary | ICD-10-CM | POA: Diagnosis not present

## 2018-09-23 DIAGNOSIS — J309 Allergic rhinitis, unspecified: Secondary | ICD-10-CM | POA: Diagnosis not present

## 2018-09-23 DIAGNOSIS — N951 Menopausal and female climacteric states: Secondary | ICD-10-CM | POA: Diagnosis not present

## 2018-09-23 DIAGNOSIS — R5383 Other fatigue: Secondary | ICD-10-CM | POA: Diagnosis not present

## 2018-10-31 ENCOUNTER — Telehealth: Payer: Self-pay

## 2018-10-31 NOTE — Telephone Encounter (Signed)
Called pharmacy and pharmacist stated back in February that patient had brought in 3 scripts for her adderall and they filled the first one. There was a mistake and they gave her the 10mg  instead of the five. Pharmacy called patient to make her aware of the mistake but patient stated that when she went to the [facility] she thinks someone stole the bottle. Pharmacist was asking if I knew anything about the patient and I made her aware that I did not and that Dr. Alain Marion and his CMA were out of the office today. She stated that she will call back Monday or if she can have betty call back Monday would be great.

## 2018-10-31 NOTE — Telephone Encounter (Signed)
Copied from [place] [id number]. Topic: General - Inquiry >> Oct 31, 2018  9:50 AM Vernona Rieger wrote: Reason for CRM: Kim with [facility] would like Dr Plotnikov's nurse to call her regarding her Adderall. She said that it is very complicated and that is why she just rather speak directly to the nurse @ [phone]- [phone]

## 2018-11-03 MED ORDER — AMPHETAMINE-DEXTROAMPHETAMINE 5 MG PO TABS: 5.0000 mg | ORAL_TABLET | Freq: Two times a day (BID) | ORAL | 0 refills | Status: DC

## 2018-11-03 NOTE — Addendum Note (Signed)
Addended by: Cassandria Anger on: 11/03/2018 03:41 PM   Modules accepted: Orders

## 2018-11-03 NOTE — Telephone Encounter (Signed)
[facility] Thx

## 2018-11-05 NOTE — Telephone Encounter (Signed)
Kim from pharmacy explained what happened with medication and that patient has not contacted pharmacy about a refill

## 2018-11-05 NOTE — Telephone Encounter (Signed)
Kim with [facility] stated she needs to speak with Dr. Alain Marion or his assistant regarding an adderrall rx from February. Please advise.   [place], [place] [phone] (Phone) [phone] (Fax)

## 2018-11-20 ENCOUNTER — Encounter: Payer: Self-pay | Admitting: Internal Medicine

## 2018-11-20 ENCOUNTER — Ambulatory Visit (INDEPENDENT_AMBULATORY_CARE_PROVIDER_SITE_OTHER): Payer: BLUE CROSS/BLUE SHIELD | Admitting: Internal Medicine

## 2018-11-20 DIAGNOSIS — F418 Other specified anxiety disorders: Secondary | ICD-10-CM | POA: Diagnosis not present

## 2018-11-20 DIAGNOSIS — F900 Attention-deficit hyperactivity disorder, predominantly inattentive type: Secondary | ICD-10-CM

## 2018-11-20 DIAGNOSIS — G4700 Insomnia, unspecified: Secondary | ICD-10-CM | POA: Diagnosis not present

## 2018-11-20 MED ORDER — DIAZEPAM 5 MG PO TABS: 2.5000 mg | ORAL_TABLET | Freq: Two times a day (BID) | ORAL | 2 refills | Status: DC | PRN

## 2018-11-20 NOTE — Assessment & Plan Note (Signed)
Continue with Adderall. °

## 2018-11-20 NOTE — Progress Notes (Signed)
Virtual Visit via Telephone Note  I connected with Jennifer [name] on 11/20/18 at  3:20 PM EDT by telephone and verified that I am speaking with the correct person using two identifiers.   I discussed the limitations, risks, security and privacy concerns of performing an evaluation and management service by telephone and the availability of in person appointments. I also discussed with the patient that there may be a patient responsible charge related to this service. The patient expressed understanding and agreed to proceed.   History of Present Illness:   Follow-up on ADD.  She did better with a brand name Adderall however it is cost prohibitive. Valeri is complaining of a lot of stress, anxiety, insomnia because of the ongoing COVID-19 situation.  She is germophobic and has been unable to leave her house for a month. She is worn out.  In the past when her mother was sick and she was very stressed, she had a success with diazepam. Observations/Objective:  The patient is in no acute distress.  She looks well Assessment and Plan: COVID-19 prevention discussed. See plan Follow Up Instructions:    I discussed the assessment and treatment plan with the patient. The patient was provided an opportunity to ask questions and all were answered. The patient agreed with the plan and demonstrated an understanding of the instructions.   The patient was advised to call back or seek an in-person evaluation if the symptoms worsen or if the condition fails to improve as anticipated.  I provided 35 minutes of non-face-to-face time during this encounter.   Walker Kehr, MD

## 2018-11-20 NOTE — Assessment & Plan Note (Signed)
Worse.  Will use diazepam with caution PRN.  Potential benefits of a short/long term benzodiazepines  use as well as potential risks  and complications were explained to the patient and were aknowledged.

## 2018-12-08 DIAGNOSIS — F4321 Adjustment disorder with depressed mood: Secondary | ICD-10-CM | POA: Diagnosis not present

## 2018-12-08 DIAGNOSIS — N952 Postmenopausal atrophic vaginitis: Secondary | ICD-10-CM | POA: Diagnosis not present

## 2019-01-12 DIAGNOSIS — F4321 Adjustment disorder with depressed mood: Secondary | ICD-10-CM | POA: Diagnosis not present

## 2019-01-12 DIAGNOSIS — Z13820 Encounter for screening for osteoporosis: Secondary | ICD-10-CM | POA: Diagnosis not present

## 2019-01-23 DIAGNOSIS — S52502A Unspecified fracture of the lower end of left radius, initial encounter for closed fracture: Secondary | ICD-10-CM | POA: Diagnosis not present

## 2019-01-23 DIAGNOSIS — S62343A Nondisplaced fracture of base of third metacarpal bone, left hand, initial encounter for closed fracture: Secondary | ICD-10-CM | POA: Diagnosis not present

## 2019-01-23 DIAGNOSIS — S62353A Nondisplaced fracture of shaft of third metacarpal bone, left hand, initial encounter for closed fracture: Secondary | ICD-10-CM | POA: Diagnosis not present

## 2019-01-23 DIAGNOSIS — W010XXA Fall on same level from slipping, tripping and stumbling without subsequent striking against object, initial encounter: Secondary | ICD-10-CM | POA: Diagnosis not present

## 2019-01-24 ENCOUNTER — Telehealth: Payer: Self-pay

## 2019-01-24 NOTE — Telephone Encounter (Signed)
I'm sorry she broke her hand! Do agree with contacting ortho clinic for stronger pain medication. In interim, may try to alternate tylenol 500mg  TID with ibuprofen 400-600mg  TID with meals to see if any better pain control.  Caution with ibuprofen however - it's listed as an allergy on her chart?

## 2019-01-24 NOTE — Telephone Encounter (Signed)
Spoke to pt. She said the [facility] office did call her back and took care of the pain medication.

## 2019-01-24 NOTE — Telephone Encounter (Signed)
[facility] put the pt through to the [facility] line.   Pt broke her hand yesterday. She was seen at [facility] and was deemed to have several fractures in her hand and wrist. She was casted. Did not send her home with any pain medication. Told her to take Advil.  She said she has called them and left a message with the on call provider but they have not called her back. Was hoping to talk to her PCP to get him to do a rx for pain. Advil is not helping.  Please advise at [phone].

## 2019-01-26 NOTE — Telephone Encounter (Signed)
Noted. Thx.

## 2019-01-28 DIAGNOSIS — W19XXXD Unspecified fall, subsequent encounter: Secondary | ICD-10-CM | POA: Diagnosis not present

## 2019-01-28 DIAGNOSIS — S62353D Nondisplaced fracture of shaft of third metacarpal bone, left hand, subsequent encounter for fracture with routine healing: Secondary | ICD-10-CM | POA: Diagnosis not present

## 2019-02-09 ENCOUNTER — Ambulatory Visit (INDEPENDENT_AMBULATORY_CARE_PROVIDER_SITE_OTHER): Payer: BC Managed Care – PPO | Admitting: Internal Medicine

## 2019-02-09 ENCOUNTER — Encounter: Payer: Self-pay | Admitting: Internal Medicine

## 2019-02-09 DIAGNOSIS — F900 Attention-deficit hyperactivity disorder, predominantly inattentive type: Secondary | ICD-10-CM

## 2019-02-09 DIAGNOSIS — R202 Paresthesia of skin: Secondary | ICD-10-CM

## 2019-02-09 DIAGNOSIS — E559 Vitamin D deficiency, unspecified: Secondary | ICD-10-CM | POA: Diagnosis not present

## 2019-02-09 DIAGNOSIS — Z0000 Encounter for general adult medical examination without abnormal findings: Secondary | ICD-10-CM

## 2019-02-09 DIAGNOSIS — S62303S Unspecified fracture of third metacarpal bone, left hand, sequela: Secondary | ICD-10-CM

## 2019-02-09 DIAGNOSIS — S62309A Unspecified fracture of unspecified metacarpal bone, initial encounter for closed fracture: Secondary | ICD-10-CM | POA: Insufficient documentation

## 2019-02-09 DIAGNOSIS — R3915 Urgency of urination: Secondary | ICD-10-CM

## 2019-02-09 MED ORDER — AMPHETAMINE-DEXTROAMPHETAMINE 5 MG PO TABS: 5.0000 mg | ORAL_TABLET | Freq: Two times a day (BID) | ORAL | 0 refills | Status: DC

## 2019-02-09 MED ORDER — VENLAFAXINE HCL ER 37.5 MG PO CP24: 37.5000 mg | ORAL_CAPSULE | Freq: Every day | ORAL | 5 refills | Status: DC

## 2019-02-09 NOTE — Assessment & Plan Note (Signed)
Vit D 

## 2019-02-09 NOTE — Assessment & Plan Note (Signed)
S/p a closed nondisplaced fracture of shaft of third metacarpal bone of left hand - seeing Dr Melina Copa Cast

## 2019-02-09 NOTE — Assessment & Plan Note (Signed)
On Effexor 37.5 mg/d (per Dr Daphene Jaeger)

## 2019-02-09 NOTE — Progress Notes (Signed)
Virtual Visit via Video Note  I connected with Jennifer [name] on 02/09/19 at  3:20 PM EDT by a video enabled telemedicine application and verified that I am speaking with the correct person using two identifiers.   I discussed the limitations of evaluation and management by telemedicine and the availability of in person appointments. The patient expressed understanding and agreed to proceed.  History of Present Illness: We need to follow-up on a closed nondisplaced fracture of shaft of third metacarpal bone of left hand - seeing Dr Melina Copa F/u sleep disorder/ADD C/o depression - Effexor was helping  There has been no runny nose, cough, chest pain, shortness of breath, abdominal pain, diarrhea, constipation, arthralgias, skin rashes.   Observations/Objective: The patient appears to be in no acute distress, looks well. Arm in a cast  Assessment and Plan:  See my Assessment and Plan. Follow Up Instructions:    I discussed the assessment and treatment plan with the patient. The patient was provided an opportunity to ask questions and all were answered. The patient agreed with the plan and demonstrated an understanding of the instructions.   The patient was advised to call back or seek an in-person evaluation if the symptoms worsen or if the condition fails to improve as anticipated.  I provided face-to-face time during this encounter. We were at different locations.   Walker Kehr, MD

## 2019-02-09 NOTE — Assessment & Plan Note (Signed)
UA Pt thinks it is a side effect of Effexor

## 2019-02-16 DIAGNOSIS — J309 Allergic rhinitis, unspecified: Secondary | ICD-10-CM | POA: Diagnosis not present

## 2019-02-16 DIAGNOSIS — J069 Acute upper respiratory infection, unspecified: Secondary | ICD-10-CM | POA: Diagnosis not present

## 2019-02-16 DIAGNOSIS — N951 Menopausal and female climacteric states: Secondary | ICD-10-CM | POA: Diagnosis not present

## 2019-02-16 DIAGNOSIS — R350 Frequency of micturition: Secondary | ICD-10-CM | POA: Diagnosis not present

## 2019-02-18 DIAGNOSIS — Z4789 Encounter for other orthopedic aftercare: Secondary | ICD-10-CM | POA: Diagnosis not present

## 2019-02-18 DIAGNOSIS — X58XXXD Exposure to other specified factors, subsequent encounter: Secondary | ICD-10-CM | POA: Diagnosis not present

## 2019-02-18 DIAGNOSIS — S52502D Unspecified fracture of the lower end of left radius, subsequent encounter for closed fracture with routine healing: Secondary | ICD-10-CM | POA: Diagnosis not present

## 2019-02-18 DIAGNOSIS — S52502A Unspecified fracture of the lower end of left radius, initial encounter for closed fracture: Secondary | ICD-10-CM | POA: Diagnosis not present

## 2019-02-18 DIAGNOSIS — S62353D Nondisplaced fracture of shaft of third metacarpal bone, left hand, subsequent encounter for fracture with routine healing: Secondary | ICD-10-CM | POA: Diagnosis not present

## 2019-02-20 ENCOUNTER — Telehealth: Payer: Self-pay | Admitting: *Deleted

## 2019-02-20 NOTE — Telephone Encounter (Signed)
Copied from [place] [id number]. Topic: General - Other >> Feb 20, 2019  1:46 PM Rainey Pines A wrote: Patient would like a callback from Dr.Plotnikov nurse in regards to her antidepressant . Patient stated that it isnt working and stated that she needs a callback today.

## 2019-02-20 NOTE — Telephone Encounter (Signed)
Saul can increase Effexor to 75 mg/d Thx

## 2019-02-23 NOTE — Telephone Encounter (Signed)
Left message for patient to call back. CRM created 

## 2019-03-03 DIAGNOSIS — S52502A Unspecified fracture of the lower end of left radius, initial encounter for closed fracture: Secondary | ICD-10-CM | POA: Diagnosis not present

## 2019-03-03 DIAGNOSIS — M25642 Stiffness of left hand, not elsewhere classified: Secondary | ICD-10-CM | POA: Diagnosis not present

## 2019-03-03 DIAGNOSIS — M79645 Pain in left finger(s): Secondary | ICD-10-CM | POA: Diagnosis not present

## 2019-03-03 DIAGNOSIS — M25532 Pain in left wrist: Secondary | ICD-10-CM | POA: Diagnosis not present

## 2019-03-09 NOTE — Telephone Encounter (Signed)
Pt called and stated she would like to taper off this med she would like to talk to nurse to see what she needs to do?   Best number -[phone] She has the time release tabs and can not break them in 1/2

## 2019-03-10 DIAGNOSIS — F329 Major depressive disorder, single episode, unspecified: Secondary | ICD-10-CM | POA: Diagnosis not present

## 2019-03-10 DIAGNOSIS — N952 Postmenopausal atrophic vaginitis: Secondary | ICD-10-CM | POA: Diagnosis not present

## 2019-03-10 NOTE — Telephone Encounter (Signed)
PCP is out of the office until 03/16/19. Will hold message until closer to then as I need advise from her MD who knows her well.

## 2019-03-11 NOTE — Telephone Encounter (Signed)
Left message for patient to call back  

## 2019-03-11 NOTE — Telephone Encounter (Signed)
Called pt to verify current dose. No answer. Left message for patient to call back with current dose.

## 2019-03-11 NOTE — Telephone Encounter (Signed)
Patient is currently taking 37.5 mg daily.

## 2019-03-11 NOTE — Telephone Encounter (Signed)
The key is to taper slowly to avoid side effects.  She can try taking every other day for a week or two and then every two days and slowly taper off the medication.    If this is not working we can change to the 25 mg dose that she has to take twice a day for a while and slowly taper from there.

## 2019-03-11 NOTE — Telephone Encounter (Signed)
Called pt to inform PCP is out of office. No answer/unable to leave vm.

## 2019-03-11 NOTE — Telephone Encounter (Signed)
Is she taking 75 mg daily or 37.5 mg daily?

## 2019-03-11 NOTE — Telephone Encounter (Signed)
I spoke to pt- she c/o a feeling like a rubber band snapping in her head and "brain zaps" from trying to stop the medication on her own. She also c/o insomnia and dizziness from taking the medication. Patient is upset and demanding to get off Effexor XR ASAP.  Please advise in PCP's absence. Thanks!

## 2019-03-12 MED ORDER — VENLAFAXINE HCL 25 MG PO TABS: 25.0000 mg | ORAL_TABLET | Freq: Two times a day (BID) | ORAL | 0 refills | Status: DC

## 2019-03-12 NOTE — Telephone Encounter (Signed)
rx sent - she can cut this pills to help with slow taper

## 2019-03-12 NOTE — Telephone Encounter (Signed)
Pt informed of below.  

## 2019-03-12 NOTE — Telephone Encounter (Signed)
Pt informed of below. She states she tried to taper by taking a dose qod.  She is requesting new rx for lower dose [facility] [address]

## 2019-03-18 DIAGNOSIS — S52502A Unspecified fracture of the lower end of left radius, initial encounter for closed fracture: Secondary | ICD-10-CM | POA: Diagnosis not present

## 2019-03-18 DIAGNOSIS — M25642 Stiffness of left hand, not elsewhere classified: Secondary | ICD-10-CM | POA: Diagnosis not present

## 2019-03-18 DIAGNOSIS — M25632 Stiffness of left wrist, not elsewhere classified: Secondary | ICD-10-CM | POA: Diagnosis not present

## 2019-03-18 DIAGNOSIS — M25532 Pain in left wrist: Secondary | ICD-10-CM | POA: Diagnosis not present

## 2019-03-27 DIAGNOSIS — M25642 Stiffness of left hand, not elsewhere classified: Secondary | ICD-10-CM | POA: Diagnosis not present

## 2019-03-27 DIAGNOSIS — M25632 Stiffness of left wrist, not elsewhere classified: Secondary | ICD-10-CM | POA: Diagnosis not present

## 2019-03-27 DIAGNOSIS — S52502A Unspecified fracture of the lower end of left radius, initial encounter for closed fracture: Secondary | ICD-10-CM | POA: Diagnosis not present

## 2019-03-27 DIAGNOSIS — M25532 Pain in left wrist: Secondary | ICD-10-CM | POA: Diagnosis not present

## 2019-03-30 DIAGNOSIS — M1812 Unilateral primary osteoarthritis of first carpometacarpal joint, left hand: Secondary | ICD-10-CM | POA: Diagnosis not present

## 2019-03-30 DIAGNOSIS — G5602 Carpal tunnel syndrome, left upper limb: Secondary | ICD-10-CM | POA: Diagnosis not present

## 2019-03-30 DIAGNOSIS — S62353D Nondisplaced fracture of shaft of third metacarpal bone, left hand, subsequent encounter for fracture with routine healing: Secondary | ICD-10-CM | POA: Diagnosis not present

## 2019-04-09 DIAGNOSIS — M79645 Pain in left finger(s): Secondary | ICD-10-CM | POA: Diagnosis not present

## 2019-04-09 DIAGNOSIS — M25642 Stiffness of left hand, not elsewhere classified: Secondary | ICD-10-CM | POA: Diagnosis not present

## 2019-04-09 DIAGNOSIS — M25532 Pain in left wrist: Secondary | ICD-10-CM | POA: Diagnosis not present

## 2019-04-09 DIAGNOSIS — S52502A Unspecified fracture of the lower end of left radius, initial encounter for closed fracture: Secondary | ICD-10-CM | POA: Diagnosis not present

## 2019-04-14 DIAGNOSIS — M25632 Stiffness of left wrist, not elsewhere classified: Secondary | ICD-10-CM | POA: Diagnosis not present

## 2019-04-14 DIAGNOSIS — S52502A Unspecified fracture of the lower end of left radius, initial encounter for closed fracture: Secondary | ICD-10-CM | POA: Diagnosis not present

## 2019-04-14 DIAGNOSIS — M25642 Stiffness of left hand, not elsewhere classified: Secondary | ICD-10-CM | POA: Diagnosis not present

## 2019-04-14 DIAGNOSIS — M25532 Pain in left wrist: Secondary | ICD-10-CM | POA: Diagnosis not present

## 2019-04-15 DIAGNOSIS — H4602 Optic papillitis, left eye: Secondary | ICD-10-CM | POA: Diagnosis not present

## 2019-04-16 DIAGNOSIS — M25532 Pain in left wrist: Secondary | ICD-10-CM | POA: Diagnosis not present

## 2019-04-16 DIAGNOSIS — M79645 Pain in left finger(s): Secondary | ICD-10-CM | POA: Diagnosis not present

## 2019-04-16 DIAGNOSIS — S52502A Unspecified fracture of the lower end of left radius, initial encounter for closed fracture: Secondary | ICD-10-CM | POA: Diagnosis not present

## 2019-04-16 DIAGNOSIS — M25642 Stiffness of left hand, not elsewhere classified: Secondary | ICD-10-CM | POA: Diagnosis not present

## 2019-04-20 DIAGNOSIS — J309 Allergic rhinitis, unspecified: Secondary | ICD-10-CM | POA: Diagnosis not present

## 2019-04-20 DIAGNOSIS — R5383 Other fatigue: Secondary | ICD-10-CM | POA: Diagnosis not present

## 2019-04-20 DIAGNOSIS — N951 Menopausal and female climacteric states: Secondary | ICD-10-CM | POA: Diagnosis not present

## 2019-04-21 DIAGNOSIS — S52502A Unspecified fracture of the lower end of left radius, initial encounter for closed fracture: Secondary | ICD-10-CM | POA: Diagnosis not present

## 2019-04-21 DIAGNOSIS — M25642 Stiffness of left hand, not elsewhere classified: Secondary | ICD-10-CM | POA: Diagnosis not present

## 2019-04-21 DIAGNOSIS — M25632 Stiffness of left wrist, not elsewhere classified: Secondary | ICD-10-CM | POA: Diagnosis not present

## 2019-04-21 DIAGNOSIS — M25532 Pain in left wrist: Secondary | ICD-10-CM | POA: Diagnosis not present

## 2019-04-23 DIAGNOSIS — M25532 Pain in left wrist: Secondary | ICD-10-CM | POA: Diagnosis not present

## 2019-04-23 DIAGNOSIS — S52502A Unspecified fracture of the lower end of left radius, initial encounter for closed fracture: Secondary | ICD-10-CM | POA: Diagnosis not present

## 2019-04-23 DIAGNOSIS — M25632 Stiffness of left wrist, not elsewhere classified: Secondary | ICD-10-CM | POA: Diagnosis not present

## 2019-04-23 DIAGNOSIS — M79645 Pain in left finger(s): Secondary | ICD-10-CM | POA: Diagnosis not present

## 2019-04-28 DIAGNOSIS — M25532 Pain in left wrist: Secondary | ICD-10-CM | POA: Diagnosis not present

## 2019-04-28 DIAGNOSIS — S52502A Unspecified fracture of the lower end of left radius, initial encounter for closed fracture: Secondary | ICD-10-CM | POA: Diagnosis not present

## 2019-04-28 DIAGNOSIS — M25632 Stiffness of left wrist, not elsewhere classified: Secondary | ICD-10-CM | POA: Diagnosis not present

## 2019-04-28 DIAGNOSIS — M79645 Pain in left finger(s): Secondary | ICD-10-CM | POA: Diagnosis not present

## 2019-04-30 DIAGNOSIS — S52502A Unspecified fracture of the lower end of left radius, initial encounter for closed fracture: Secondary | ICD-10-CM | POA: Diagnosis not present

## 2019-04-30 DIAGNOSIS — M79645 Pain in left finger(s): Secondary | ICD-10-CM | POA: Diagnosis not present

## 2019-04-30 DIAGNOSIS — M25532 Pain in left wrist: Secondary | ICD-10-CM | POA: Diagnosis not present

## 2019-04-30 DIAGNOSIS — M25632 Stiffness of left wrist, not elsewhere classified: Secondary | ICD-10-CM | POA: Diagnosis not present

## 2019-05-02 ENCOUNTER — Other Ambulatory Visit: Payer: Self-pay | Admitting: Internal Medicine

## 2019-05-05 DIAGNOSIS — M25632 Stiffness of left wrist, not elsewhere classified: Secondary | ICD-10-CM | POA: Diagnosis not present

## 2019-05-05 DIAGNOSIS — M25642 Stiffness of left hand, not elsewhere classified: Secondary | ICD-10-CM | POA: Diagnosis not present

## 2019-05-05 DIAGNOSIS — S52502A Unspecified fracture of the lower end of left radius, initial encounter for closed fracture: Secondary | ICD-10-CM | POA: Diagnosis not present

## 2019-05-05 DIAGNOSIS — M79645 Pain in left finger(s): Secondary | ICD-10-CM | POA: Diagnosis not present

## 2019-05-07 DIAGNOSIS — M25632 Stiffness of left wrist, not elsewhere classified: Secondary | ICD-10-CM | POA: Diagnosis not present

## 2019-05-07 DIAGNOSIS — S52502A Unspecified fracture of the lower end of left radius, initial encounter for closed fracture: Secondary | ICD-10-CM | POA: Diagnosis not present

## 2019-05-07 DIAGNOSIS — M25532 Pain in left wrist: Secondary | ICD-10-CM | POA: Diagnosis not present

## 2019-05-07 DIAGNOSIS — M79645 Pain in left finger(s): Secondary | ICD-10-CM | POA: Diagnosis not present

## 2019-05-12 DIAGNOSIS — M79645 Pain in left finger(s): Secondary | ICD-10-CM | POA: Diagnosis not present

## 2019-05-12 DIAGNOSIS — M25532 Pain in left wrist: Secondary | ICD-10-CM | POA: Diagnosis not present

## 2019-05-12 DIAGNOSIS — S52502A Unspecified fracture of the lower end of left radius, initial encounter for closed fracture: Secondary | ICD-10-CM | POA: Diagnosis not present

## 2019-05-12 DIAGNOSIS — M25632 Stiffness of left wrist, not elsewhere classified: Secondary | ICD-10-CM | POA: Diagnosis not present

## 2019-05-14 DIAGNOSIS — M25642 Stiffness of left hand, not elsewhere classified: Secondary | ICD-10-CM | POA: Diagnosis not present

## 2019-05-14 DIAGNOSIS — M25532 Pain in left wrist: Secondary | ICD-10-CM | POA: Diagnosis not present

## 2019-05-14 DIAGNOSIS — M25632 Stiffness of left wrist, not elsewhere classified: Secondary | ICD-10-CM | POA: Diagnosis not present

## 2019-05-14 DIAGNOSIS — M79645 Pain in left finger(s): Secondary | ICD-10-CM | POA: Diagnosis not present

## 2019-05-25 DIAGNOSIS — Z888 Allergy status to other drugs, medicaments and biological substances status: Secondary | ICD-10-CM | POA: Diagnosis not present

## 2019-05-25 DIAGNOSIS — M19042 Primary osteoarthritis, left hand: Secondary | ICD-10-CM | POA: Diagnosis not present

## 2019-05-25 DIAGNOSIS — Z91040 Latex allergy status: Secondary | ICD-10-CM | POA: Diagnosis not present

## 2019-05-25 DIAGNOSIS — S6992XD Unspecified injury of left wrist, hand and finger(s), subsequent encounter: Secondary | ICD-10-CM | POA: Diagnosis not present

## 2019-05-25 DIAGNOSIS — M1812 Unilateral primary osteoarthritis of first carpometacarpal joint, left hand: Secondary | ICD-10-CM | POA: Diagnosis not present

## 2019-05-25 DIAGNOSIS — Z886 Allergy status to analgesic agent status: Secondary | ICD-10-CM | POA: Diagnosis not present

## 2019-05-25 DIAGNOSIS — Z885 Allergy status to narcotic agent status: Secondary | ICD-10-CM | POA: Diagnosis not present

## 2019-05-25 DIAGNOSIS — M85842 Other specified disorders of bone density and structure, left hand: Secondary | ICD-10-CM | POA: Diagnosis not present

## 2019-05-25 DIAGNOSIS — S62323D Displaced fracture of shaft of third metacarpal bone, left hand, subsequent encounter for fracture with routine healing: Secondary | ICD-10-CM | POA: Diagnosis not present

## 2019-05-25 DIAGNOSIS — X58XXXD Exposure to other specified factors, subsequent encounter: Secondary | ICD-10-CM | POA: Diagnosis not present

## 2019-05-25 DIAGNOSIS — G5602 Carpal tunnel syndrome, left upper limb: Secondary | ICD-10-CM | POA: Diagnosis not present

## 2019-05-25 DIAGNOSIS — S62353D Nondisplaced fracture of shaft of third metacarpal bone, left hand, subsequent encounter for fracture with routine healing: Secondary | ICD-10-CM | POA: Diagnosis not present

## 2019-05-25 DIAGNOSIS — Z882 Allergy status to sulfonamides status: Secondary | ICD-10-CM | POA: Diagnosis not present

## 2019-05-25 DIAGNOSIS — R2231 Localized swelling, mass and lump, right upper limb: Secondary | ICD-10-CM | POA: Diagnosis not present

## 2019-05-25 DIAGNOSIS — Z881 Allergy status to other antibiotic agents status: Secondary | ICD-10-CM | POA: Diagnosis not present

## 2019-05-25 DIAGNOSIS — S62623D Displaced fracture of medial phalanx of left middle finger, subsequent encounter for fracture with routine healing: Secondary | ICD-10-CM | POA: Diagnosis not present

## 2019-05-26 DIAGNOSIS — M79645 Pain in left finger(s): Secondary | ICD-10-CM | POA: Diagnosis not present

## 2019-05-26 DIAGNOSIS — S52502A Unspecified fracture of the lower end of left radius, initial encounter for closed fracture: Secondary | ICD-10-CM | POA: Diagnosis not present

## 2019-05-26 DIAGNOSIS — M25532 Pain in left wrist: Secondary | ICD-10-CM | POA: Diagnosis not present

## 2019-05-26 DIAGNOSIS — M25632 Stiffness of left wrist, not elsewhere classified: Secondary | ICD-10-CM | POA: Diagnosis not present

## 2019-06-02 DIAGNOSIS — M25632 Stiffness of left wrist, not elsewhere classified: Secondary | ICD-10-CM | POA: Diagnosis not present

## 2019-06-02 DIAGNOSIS — M25532 Pain in left wrist: Secondary | ICD-10-CM | POA: Diagnosis not present

## 2019-06-02 DIAGNOSIS — M79645 Pain in left finger(s): Secondary | ICD-10-CM | POA: Diagnosis not present

## 2019-06-02 DIAGNOSIS — M25642 Stiffness of left hand, not elsewhere classified: Secondary | ICD-10-CM | POA: Diagnosis not present

## 2019-06-04 DIAGNOSIS — M25642 Stiffness of left hand, not elsewhere classified: Secondary | ICD-10-CM | POA: Diagnosis not present

## 2019-06-04 DIAGNOSIS — M79645 Pain in left finger(s): Secondary | ICD-10-CM | POA: Diagnosis not present

## 2019-06-04 DIAGNOSIS — M25632 Stiffness of left wrist, not elsewhere classified: Secondary | ICD-10-CM | POA: Diagnosis not present

## 2019-06-04 DIAGNOSIS — M25532 Pain in left wrist: Secondary | ICD-10-CM | POA: Diagnosis not present

## 2019-06-09 DIAGNOSIS — M25642 Stiffness of left hand, not elsewhere classified: Secondary | ICD-10-CM | POA: Diagnosis not present

## 2019-06-09 DIAGNOSIS — M25532 Pain in left wrist: Secondary | ICD-10-CM | POA: Diagnosis not present

## 2019-06-09 DIAGNOSIS — M25632 Stiffness of left wrist, not elsewhere classified: Secondary | ICD-10-CM | POA: Diagnosis not present

## 2019-06-09 DIAGNOSIS — M79645 Pain in left finger(s): Secondary | ICD-10-CM | POA: Diagnosis not present

## 2019-06-10 ENCOUNTER — Ambulatory Visit (INDEPENDENT_AMBULATORY_CARE_PROVIDER_SITE_OTHER): Payer: BC Managed Care – PPO | Admitting: Internal Medicine

## 2019-06-10 ENCOUNTER — Encounter: Payer: Self-pay | Admitting: Internal Medicine

## 2019-06-10 ENCOUNTER — Other Ambulatory Visit: Payer: Self-pay

## 2019-06-10 VITALS — BP 128/80 | HR 67 | Temp 98.1°F | Ht 63.0 in | Wt 171.0 lb

## 2019-06-10 DIAGNOSIS — M720 Palmar fascial fibromatosis [Dupuytren]: Secondary | ICD-10-CM

## 2019-06-10 DIAGNOSIS — E034 Atrophy of thyroid (acquired): Secondary | ICD-10-CM

## 2019-06-10 DIAGNOSIS — E538 Deficiency of other specified B group vitamins: Secondary | ICD-10-CM

## 2019-06-10 DIAGNOSIS — Z780 Asymptomatic menopausal state: Secondary | ICD-10-CM

## 2019-06-10 DIAGNOSIS — E559 Vitamin D deficiency, unspecified: Secondary | ICD-10-CM | POA: Diagnosis not present

## 2019-06-10 DIAGNOSIS — R5383 Other fatigue: Secondary | ICD-10-CM

## 2019-06-10 MED ORDER — AMPHETAMINE-DEXTROAMPHETAMINE 5 MG PO TABS: 5.0000 mg | ORAL_TABLET | Freq: Two times a day (BID) | ORAL | 0 refills | Status: DC

## 2019-06-10 MED ORDER — BUSPIRONE HCL 10 MG PO TABS: 10.0000 mg | ORAL_TABLET | Freq: Two times a day (BID) | ORAL | 11 refills | Status: DC

## 2019-06-10 NOTE — Assessment & Plan Note (Addendum)
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

## 2019-06-10 NOTE — Assessment & Plan Note (Signed)
On B12 q 1 month

## 2019-06-10 NOTE — Assessment & Plan Note (Signed)
Labs

## 2019-06-10 NOTE — Assessment & Plan Note (Signed)
On Vit D 

## 2019-06-10 NOTE — Progress Notes (Signed)
Subjective:  Patient ID: Jennifer [name], female    DOB: [birth year]  Age: 63 y.o. MRN: [id number]  CC: No chief complaint on file.   HPI Jennifer [name] presents for a follow-up on a closed nondisplaced fracture of shaft of third metacarpal bone of left hand - seeing Dr Nicoletta Dress; in PT F/u sleep disorder/ADD F/u depression - Effexor   Outpatient Medications Prior to Visit  Medication Sig Dispense Refill  . amphetamine-dextroamphetamine (ADDERALL) 5 MG tablet Take 1 tablet (5 mg total) by mouth 2 (two) times daily. AM and lunch 60 tablet 0  . amphetamine-dextroamphetamine (ADDERALL) 5 MG tablet Take 1 tablet (5 mg total) by mouth 2 (two) times daily. AM and lunch 60 tablet 0  . amphetamine-dextroamphetamine (ADDERALL) 5 MG tablet Take 1 tablet (5 mg total) by mouth 2 (two) times daily. AM and lunch 60 tablet 0  . Cholecalciferol (VITAMIN D-3) 5000 UNITS TABS Take 1 tablet by mouth daily.     Marland Kitchen EPINEPHrine (EPIPEN JR 2-PAK) 0.15 MG/0.3ML injection Inject 0.3 mLs (0.15 mg total) into the muscle as needed for anaphylaxis. 2 each 1  . ESTRING 2 MG vaginal ring INSERT 1 RING VAGINALLY EVERY 3 MONTHS AS DIRECTED  0  . fish oil-omega-3 fatty acids 1000 MG capsule Take 2 g by mouth daily.      . Multiple Vitamin (MULTIVITAMIN WITH MINERALS) TABS Take 1 tablet by mouth daily.    . Nutritional Supplements (DHEA PO) Take 33 mg by mouth daily.     Marland Kitchen thyroid (NATURE-THROID) 32.5 MG tablet Nature-Throid 32.5 mg tablet    . vitamin C (ASCORBIC ACID) 500 MG tablet Take 1,000 mg by mouth daily.    . vitamin E 1000 UNIT capsule Take by mouth.    . diazepam (VALIUM) 5 MG tablet Take 0.5-1 tablets (2.5-5 mg total) by mouth 2 (two) times daily as needed for anxiety. 60 tablet 2  . fluticasone furoate-vilanterol (BREO ELLIPTA) 100-25 MCG/INH AEPB Inhale 1 puff into the lungs daily. 1 each 5  . venlafaxine (EFFEXOR) 25 MG tablet TAKE 1 TABLET (25 MG TOTAL) BY MOUTH 2 (TWO) TIMES DAILY. TAPER OFF SLOWLY. 60 tablet  0   No facility-administered medications prior to visit.     ROS: Review of Systems  Constitutional: Negative for activity change, appetite change, chills, fatigue and unexpected weight change.  HENT: Negative for congestion, mouth sores and sinus pressure.   Eyes: Negative for visual disturbance.  Respiratory: Negative for cough and chest tightness.   Gastrointestinal: Negative for abdominal pain and nausea.  Genitourinary: Negative for difficulty urinating, frequency and vaginal pain.  Musculoskeletal: Positive for arthralgias. Negative for back pain and gait problem.  Skin: Negative for pallor and rash.  Neurological: Positive for dizziness. Negative for tremors, weakness, numbness and headaches.  Psychiatric/Behavioral: Positive for decreased concentration and sleep disturbance. Negative for confusion.    Objective:  BP 128/80 (BP Location: Left Arm, Patient Position: Sitting, Cuff Size: Normal)   Pulse 67   Temp 98.1 F (36.7 C) (Oral)   Ht 5\' 3"  (1.6 m)   Wt 171 lb (77.6 kg)   LMP 02/13/2007   SpO2 97%   BMI 30.29 kg/m   BP Readings from Last 3 Encounters:  06/10/19 128/80  09/03/18 134/76  08/21/18 120/82    Wt Readings from Last 3 Encounters:  06/10/19 171 lb (77.6 kg)  09/03/18 160 lb (72.6 kg)  08/21/18 159 lb (72.1 kg)    Physical Exam Constitutional:  General: She is not in acute distress.    Appearance: She is well-developed.  HENT:     Head: Normocephalic.     Right Ear: External ear normal.     Left Ear: External ear normal.     Nose: Nose normal.  Eyes:     General:        Right eye: No discharge.        Left eye: No discharge.     Conjunctiva/sclera: Conjunctivae normal.     Pupils: Pupils are equal, round, and reactive to light.  Neck:     Musculoskeletal: Normal range of motion and neck supple.     Thyroid: No thyromegaly.     Vascular: No JVD.     Trachea: No tracheal deviation.  Cardiovascular:     Rate and Rhythm: Normal rate  and regular rhythm.     Heart sounds: Normal heart sounds.  Pulmonary:     Effort: No respiratory distress.     Breath sounds: No stridor. No wheezing.  Abdominal:     General: Bowel sounds are normal. There is no distension.     Palpations: Abdomen is soft. There is no mass.     Tenderness: There is no abdominal tenderness. There is no guarding or rebound.  Musculoskeletal:        General: Swelling and tenderness present.  Lymphadenopathy:     Cervical: No cervical adenopathy.  Skin:    Findings: No erythema or rash.  Neurological:     Mental Status: She is oriented to person, place, and time.     Cranial Nerves: No cranial nerve deficit.     Motor: No abnormal muscle tone.     Coordination: Coordination normal.     Deep Tendon Reflexes: Reflexes normal.  Psychiatric:        Behavior: Behavior normal.        Thought Content: Thought content normal.        Judgment: Judgment normal.   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  Cough EXAM: CHEST - 2 VIEW COMPARISON:  None. FINDINGS: The heart size and mediastinal contours are within normal limits. Both lungs are clear. The visualized skeletal structures are unremarkable. IMPRESSION: No active cardiopulmonary disease. Electronically Signed   By: Franchot Gallo M.D.   On: 09/04/2018 09:34    Assessment & Plan:   There are no diagnoses linked  to this encounter.   No orders of the defined types were placed in this encounter.    Follow-up: No follow-ups on file.  Walker Kehr, MD

## 2019-06-11 DIAGNOSIS — M79645 Pain in left finger(s): Secondary | ICD-10-CM | POA: Diagnosis not present

## 2019-06-11 DIAGNOSIS — D485 Neoplasm of uncertain behavior of skin: Secondary | ICD-10-CM | POA: Diagnosis not present

## 2019-06-11 DIAGNOSIS — M25632 Stiffness of left wrist, not elsewhere classified: Secondary | ICD-10-CM | POA: Diagnosis not present

## 2019-06-11 DIAGNOSIS — M25532 Pain in left wrist: Secondary | ICD-10-CM | POA: Diagnosis not present

## 2019-06-11 DIAGNOSIS — L218 Other seborrheic dermatitis: Secondary | ICD-10-CM | POA: Diagnosis not present

## 2019-06-11 DIAGNOSIS — M25642 Stiffness of left hand, not elsewhere classified: Secondary | ICD-10-CM | POA: Diagnosis not present

## 2019-06-12 DIAGNOSIS — Z780 Asymptomatic menopausal state: Secondary | ICD-10-CM | POA: Insufficient documentation

## 2019-06-18 DIAGNOSIS — M25642 Stiffness of left hand, not elsewhere classified: Secondary | ICD-10-CM | POA: Diagnosis not present

## 2019-06-18 DIAGNOSIS — M25632 Stiffness of left wrist, not elsewhere classified: Secondary | ICD-10-CM | POA: Diagnosis not present

## 2019-06-18 DIAGNOSIS — S52502A Unspecified fracture of the lower end of left radius, initial encounter for closed fracture: Secondary | ICD-10-CM | POA: Diagnosis not present

## 2019-06-18 DIAGNOSIS — M79645 Pain in left finger(s): Secondary | ICD-10-CM | POA: Diagnosis not present

## 2019-06-18 DIAGNOSIS — M25532 Pain in left wrist: Secondary | ICD-10-CM | POA: Diagnosis not present

## 2019-06-19 DIAGNOSIS — N898 Other specified noninflammatory disorders of vagina: Secondary | ICD-10-CM | POA: Diagnosis not present

## 2019-06-19 DIAGNOSIS — N952 Postmenopausal atrophic vaginitis: Secondary | ICD-10-CM | POA: Diagnosis not present

## 2019-06-21 ENCOUNTER — Encounter: Payer: Self-pay | Admitting: Internal Medicine

## 2019-06-23 DIAGNOSIS — M25632 Stiffness of left wrist, not elsewhere classified: Secondary | ICD-10-CM | POA: Diagnosis not present

## 2019-06-23 DIAGNOSIS — M25532 Pain in left wrist: Secondary | ICD-10-CM | POA: Diagnosis not present

## 2019-06-23 DIAGNOSIS — S52502A Unspecified fracture of the lower end of left radius, initial encounter for closed fracture: Secondary | ICD-10-CM | POA: Diagnosis not present

## 2019-06-23 DIAGNOSIS — M79645 Pain in left finger(s): Secondary | ICD-10-CM | POA: Diagnosis not present

## 2019-07-02 DIAGNOSIS — R3915 Urgency of urination: Secondary | ICD-10-CM | POA: Diagnosis not present

## 2019-07-02 DIAGNOSIS — R350 Frequency of micturition: Secondary | ICD-10-CM | POA: Diagnosis not present

## 2019-07-02 DIAGNOSIS — R5383 Other fatigue: Secondary | ICD-10-CM | POA: Diagnosis not present

## 2019-07-02 DIAGNOSIS — J309 Allergic rhinitis, unspecified: Secondary | ICD-10-CM | POA: Diagnosis not present

## 2019-07-02 DIAGNOSIS — N951 Menopausal and female climacteric states: Secondary | ICD-10-CM | POA: Diagnosis not present

## 2019-07-07 DIAGNOSIS — S52502A Unspecified fracture of the lower end of left radius, initial encounter for closed fracture: Secondary | ICD-10-CM | POA: Diagnosis not present

## 2019-07-07 DIAGNOSIS — M25532 Pain in left wrist: Secondary | ICD-10-CM | POA: Diagnosis not present

## 2019-07-07 DIAGNOSIS — M25642 Stiffness of left hand, not elsewhere classified: Secondary | ICD-10-CM | POA: Diagnosis not present

## 2019-07-07 DIAGNOSIS — M79645 Pain in left finger(s): Secondary | ICD-10-CM | POA: Diagnosis not present

## 2019-07-14 DIAGNOSIS — M1812 Unilateral primary osteoarthritis of first carpometacarpal joint, left hand: Secondary | ICD-10-CM | POA: Diagnosis not present

## 2019-07-14 DIAGNOSIS — S62353D Nondisplaced fracture of shaft of third metacarpal bone, left hand, subsequent encounter for fracture with routine healing: Secondary | ICD-10-CM | POA: Diagnosis not present

## 2019-07-14 DIAGNOSIS — M79645 Pain in left finger(s): Secondary | ICD-10-CM | POA: Diagnosis not present

## 2019-07-14 DIAGNOSIS — S52502D Unspecified fracture of the lower end of left radius, subsequent encounter for closed fracture with routine healing: Secondary | ICD-10-CM | POA: Diagnosis not present

## 2019-07-20 DIAGNOSIS — Z1211 Encounter for screening for malignant neoplasm of colon: Secondary | ICD-10-CM | POA: Diagnosis not present

## 2019-07-21 DIAGNOSIS — S52502D Unspecified fracture of the lower end of left radius, subsequent encounter for closed fracture with routine healing: Secondary | ICD-10-CM | POA: Diagnosis not present

## 2019-07-21 DIAGNOSIS — M25532 Pain in left wrist: Secondary | ICD-10-CM | POA: Diagnosis not present

## 2019-07-21 DIAGNOSIS — S62353D Nondisplaced fracture of shaft of third metacarpal bone, left hand, subsequent encounter for fracture with routine healing: Secondary | ICD-10-CM | POA: Diagnosis not present

## 2019-07-21 DIAGNOSIS — M25632 Stiffness of left wrist, not elsewhere classified: Secondary | ICD-10-CM | POA: Diagnosis not present

## 2019-08-13 DIAGNOSIS — M25532 Pain in left wrist: Secondary | ICD-10-CM | POA: Diagnosis not present

## 2019-08-13 DIAGNOSIS — M25632 Stiffness of left wrist, not elsewhere classified: Secondary | ICD-10-CM | POA: Diagnosis not present

## 2019-08-13 DIAGNOSIS — S62353D Nondisplaced fracture of shaft of third metacarpal bone, left hand, subsequent encounter for fracture with routine healing: Secondary | ICD-10-CM | POA: Diagnosis not present

## 2019-08-13 DIAGNOSIS — S52502D Unspecified fracture of the lower end of left radius, subsequent encounter for closed fracture with routine healing: Secondary | ICD-10-CM | POA: Diagnosis not present

## 2019-09-01 DIAGNOSIS — M25532 Pain in left wrist: Secondary | ICD-10-CM | POA: Diagnosis not present

## 2019-09-01 DIAGNOSIS — S62353D Nondisplaced fracture of shaft of third metacarpal bone, left hand, subsequent encounter for fracture with routine healing: Secondary | ICD-10-CM | POA: Diagnosis not present

## 2019-09-01 DIAGNOSIS — M25632 Stiffness of left wrist, not elsewhere classified: Secondary | ICD-10-CM | POA: Diagnosis not present

## 2019-09-01 DIAGNOSIS — M25642 Stiffness of left hand, not elsewhere classified: Secondary | ICD-10-CM | POA: Diagnosis not present

## 2019-09-08 DIAGNOSIS — H539 Unspecified visual disturbance: Secondary | ICD-10-CM | POA: Diagnosis not present

## 2019-09-08 DIAGNOSIS — S62353D Nondisplaced fracture of shaft of third metacarpal bone, left hand, subsequent encounter for fracture with routine healing: Secondary | ICD-10-CM | POA: Diagnosis not present

## 2019-09-08 DIAGNOSIS — M25642 Stiffness of left hand, not elsewhere classified: Secondary | ICD-10-CM | POA: Diagnosis not present

## 2019-09-08 DIAGNOSIS — M1812 Unilateral primary osteoarthritis of first carpometacarpal joint, left hand: Secondary | ICD-10-CM | POA: Diagnosis not present

## 2019-09-08 DIAGNOSIS — M25632 Stiffness of left wrist, not elsewhere classified: Secondary | ICD-10-CM | POA: Diagnosis not present

## 2019-09-15 DIAGNOSIS — M1812 Unilateral primary osteoarthritis of first carpometacarpal joint, left hand: Secondary | ICD-10-CM | POA: Diagnosis not present

## 2019-09-15 DIAGNOSIS — S62353D Nondisplaced fracture of shaft of third metacarpal bone, left hand, subsequent encounter for fracture with routine healing: Secondary | ICD-10-CM | POA: Diagnosis not present

## 2019-09-15 DIAGNOSIS — S52502D Unspecified fracture of the lower end of left radius, subsequent encounter for closed fracture with routine healing: Secondary | ICD-10-CM | POA: Diagnosis not present

## 2019-09-15 DIAGNOSIS — M25642 Stiffness of left hand, not elsewhere classified: Secondary | ICD-10-CM | POA: Diagnosis not present

## 2019-09-22 DIAGNOSIS — N952 Postmenopausal atrophic vaginitis: Secondary | ICD-10-CM | POA: Diagnosis not present

## 2019-09-22 DIAGNOSIS — Z01419 Encounter for gynecological examination (general) (routine) without abnormal findings: Secondary | ICD-10-CM | POA: Diagnosis not present

## 2019-09-22 DIAGNOSIS — F4322 Adjustment disorder with anxiety: Secondary | ICD-10-CM | POA: Diagnosis not present

## 2019-09-22 DIAGNOSIS — Z1211 Encounter for screening for malignant neoplasm of colon: Secondary | ICD-10-CM | POA: Diagnosis not present

## 2019-09-22 DIAGNOSIS — Z1231 Encounter for screening mammogram for malignant neoplasm of breast: Secondary | ICD-10-CM | POA: Diagnosis not present

## 2019-09-22 DIAGNOSIS — R350 Frequency of micturition: Secondary | ICD-10-CM | POA: Diagnosis not present

## 2019-10-02 DIAGNOSIS — M1812 Unilateral primary osteoarthritis of first carpometacarpal joint, left hand: Secondary | ICD-10-CM | POA: Diagnosis not present

## 2019-10-02 DIAGNOSIS — M79645 Pain in left finger(s): Secondary | ICD-10-CM | POA: Diagnosis not present

## 2019-10-02 DIAGNOSIS — S62353D Nondisplaced fracture of shaft of third metacarpal bone, left hand, subsequent encounter for fracture with routine healing: Secondary | ICD-10-CM | POA: Diagnosis not present

## 2019-10-02 DIAGNOSIS — S52502D Unspecified fracture of the lower end of left radius, subsequent encounter for closed fracture with routine healing: Secondary | ICD-10-CM | POA: Diagnosis not present

## 2019-10-16 DIAGNOSIS — M25642 Stiffness of left hand, not elsewhere classified: Secondary | ICD-10-CM | POA: Diagnosis not present

## 2019-10-16 DIAGNOSIS — M1812 Unilateral primary osteoarthritis of first carpometacarpal joint, left hand: Secondary | ICD-10-CM | POA: Diagnosis not present

## 2019-10-16 DIAGNOSIS — S62353D Nondisplaced fracture of shaft of third metacarpal bone, left hand, subsequent encounter for fracture with routine healing: Secondary | ICD-10-CM | POA: Diagnosis not present

## 2019-10-16 DIAGNOSIS — M25632 Stiffness of left wrist, not elsewhere classified: Secondary | ICD-10-CM | POA: Diagnosis not present

## 2019-10-21 DIAGNOSIS — M1812 Unilateral primary osteoarthritis of first carpometacarpal joint, left hand: Secondary | ICD-10-CM | POA: Diagnosis not present

## 2019-10-21 DIAGNOSIS — M25632 Stiffness of left wrist, not elsewhere classified: Secondary | ICD-10-CM | POA: Diagnosis not present

## 2019-10-21 DIAGNOSIS — M79645 Pain in left finger(s): Secondary | ICD-10-CM | POA: Diagnosis not present

## 2019-10-21 DIAGNOSIS — S52502D Unspecified fracture of the lower end of left radius, subsequent encounter for closed fracture with routine healing: Secondary | ICD-10-CM | POA: Diagnosis not present

## 2019-11-16 DIAGNOSIS — J309 Allergic rhinitis, unspecified: Secondary | ICD-10-CM | POA: Diagnosis not present

## 2019-11-16 DIAGNOSIS — R350 Frequency of micturition: Secondary | ICD-10-CM | POA: Diagnosis not present

## 2019-11-16 DIAGNOSIS — N951 Menopausal and female climacteric states: Secondary | ICD-10-CM | POA: Diagnosis not present

## 2019-11-16 DIAGNOSIS — J069 Acute upper respiratory infection, unspecified: Secondary | ICD-10-CM | POA: Diagnosis not present

## 2019-11-26 ENCOUNTER — Other Ambulatory Visit (INDEPENDENT_AMBULATORY_CARE_PROVIDER_SITE_OTHER): Payer: BC Managed Care – PPO

## 2019-11-26 DIAGNOSIS — R5383 Other fatigue: Secondary | ICD-10-CM | POA: Diagnosis not present

## 2019-11-26 DIAGNOSIS — E034 Atrophy of thyroid (acquired): Secondary | ICD-10-CM

## 2019-11-26 DIAGNOSIS — E559 Vitamin D deficiency, unspecified: Secondary | ICD-10-CM | POA: Diagnosis not present

## 2019-11-26 DIAGNOSIS — Z780 Asymptomatic menopausal state: Secondary | ICD-10-CM

## 2019-11-26 LAB — CBC WITH DIFFERENTIAL/PLATELET
Basophils Absolute: 0 10*3/uL (ref 0.0–0.1)
Basophils Relative: 0.6 % (ref 0.0–3.0)
Eosinophils Absolute: 0.1 10*3/uL (ref 0.0–0.7)
Eosinophils Relative: 2.3 % (ref 0.0–5.0)
HCT: 40.5 % (ref 36.0–46.0)
Hemoglobin: 13.5 g/dL (ref 12.0–15.0)
Lymphocytes Relative: 51.4 % — ABNORMAL HIGH (ref 12.0–46.0)
Lymphs Abs: 2.6 10*3/uL (ref 0.7–4.0)
MCHC: 33.4 g/dL (ref 30.0–36.0)
MCV: 92.8 fl (ref 78.0–100.0)
Monocytes Absolute: 0.5 10*3/uL (ref 0.1–1.0)
Monocytes Relative: 9.1 % (ref 3.0–12.0)
Neutro Abs: 1.9 10*3/uL (ref 1.4–7.7)
Neutrophils Relative %: 36.6 % — ABNORMAL LOW (ref 43.0–77.0)
Platelets: 307 10*3/uL (ref 150.0–400.0)
RBC: 4.37 Mil/uL (ref 3.87–5.11)
RDW: 14.1 % (ref 11.5–15.5)
WBC: 5.1 10*3/uL (ref 4.0–10.5)

## 2019-11-26 LAB — TSH: TSH: 1.91 u[IU]/mL (ref 0.35–4.50)

## 2019-11-26 LAB — T3, FREE: T3, Free: 3.1 pg/mL (ref 2.3–4.2)

## 2019-11-26 LAB — CORTISOL: Cortisol, Plasma: 10 ug/dL

## 2019-11-26 LAB — T4, FREE: Free T4: 0.76 ng/dL (ref 0.60–1.60)

## 2019-11-26 LAB — VITAMIN D 25 HYDROXY (VIT D DEFICIENCY, FRACTURES): VITD: 92.49 ng/mL (ref 30.00–100.00)

## 2019-11-26 LAB — C-REACTIVE PROTEIN: CRP: <1 mg/dL (ref 0.5–20.0)

## 2019-11-27 LAB — DHEA-SULFATE: DHEA-SO4: 142 ug/dL — ABNORMAL HIGH (ref 12–133)

## 2019-11-27 LAB — THYROID PEROXIDASE ANTIBODIES (TPO) (REFL): Thyroperoxidase Ab SerPl-aCnc: 2 IU/mL (ref <9)

## 2019-12-04 LAB — PREGNENOLONE: Pregnenolone: 20 ng/dL

## 2019-12-05 DIAGNOSIS — D329 Benign neoplasm of meninges, unspecified: Secondary | ICD-10-CM | POA: Diagnosis not present

## 2019-12-09 DIAGNOSIS — D329 Benign neoplasm of meninges, unspecified: Secondary | ICD-10-CM | POA: Diagnosis not present

## 2019-12-10 DIAGNOSIS — R350 Frequency of micturition: Secondary | ICD-10-CM | POA: Diagnosis not present

## 2019-12-10 DIAGNOSIS — N393 Stress incontinence (female) (male): Secondary | ICD-10-CM | POA: Diagnosis not present

## 2019-12-10 DIAGNOSIS — M6281 Muscle weakness (generalized): Secondary | ICD-10-CM | POA: Diagnosis not present

## 2019-12-10 DIAGNOSIS — M62838 Other muscle spasm: Secondary | ICD-10-CM | POA: Diagnosis not present

## 2019-12-15 DIAGNOSIS — R350 Frequency of micturition: Secondary | ICD-10-CM | POA: Diagnosis not present

## 2019-12-15 DIAGNOSIS — M6281 Muscle weakness (generalized): Secondary | ICD-10-CM | POA: Diagnosis not present

## 2019-12-15 DIAGNOSIS — M62838 Other muscle spasm: Secondary | ICD-10-CM | POA: Diagnosis not present

## 2019-12-15 DIAGNOSIS — N393 Stress incontinence (female) (male): Secondary | ICD-10-CM | POA: Diagnosis not present

## 2019-12-21 DIAGNOSIS — M6281 Muscle weakness (generalized): Secondary | ICD-10-CM | POA: Diagnosis not present

## 2019-12-21 DIAGNOSIS — N393 Stress incontinence (female) (male): Secondary | ICD-10-CM | POA: Diagnosis not present

## 2019-12-21 DIAGNOSIS — M62838 Other muscle spasm: Secondary | ICD-10-CM | POA: Diagnosis not present

## 2019-12-21 DIAGNOSIS — R350 Frequency of micturition: Secondary | ICD-10-CM | POA: Diagnosis not present

## 2020-01-05 DIAGNOSIS — N952 Postmenopausal atrophic vaginitis: Secondary | ICD-10-CM | POA: Diagnosis not present

## 2020-01-06 DIAGNOSIS — N393 Stress incontinence (female) (male): Secondary | ICD-10-CM | POA: Diagnosis not present

## 2020-01-06 DIAGNOSIS — M6281 Muscle weakness (generalized): Secondary | ICD-10-CM | POA: Diagnosis not present

## 2020-01-06 DIAGNOSIS — M62838 Other muscle spasm: Secondary | ICD-10-CM | POA: Diagnosis not present

## 2020-01-06 DIAGNOSIS — R350 Frequency of micturition: Secondary | ICD-10-CM | POA: Diagnosis not present

## 2020-02-20 DIAGNOSIS — H15101 Unspecified episcleritis, right eye: Secondary | ICD-10-CM | POA: Diagnosis not present

## 2020-02-20 DIAGNOSIS — H5711 Ocular pain, right eye: Secondary | ICD-10-CM | POA: Diagnosis not present

## 2020-03-02 DIAGNOSIS — H04129 Dry eye syndrome of unspecified lacrimal gland: Secondary | ICD-10-CM | POA: Diagnosis not present

## 2020-03-02 DIAGNOSIS — H01003 Unspecified blepharitis right eye, unspecified eyelid: Secondary | ICD-10-CM | POA: Diagnosis not present

## 2020-03-02 DIAGNOSIS — H2513 Age-related nuclear cataract, bilateral: Secondary | ICD-10-CM | POA: Diagnosis not present

## 2020-03-02 DIAGNOSIS — H01006 Unspecified blepharitis left eye, unspecified eyelid: Secondary | ICD-10-CM | POA: Diagnosis not present

## 2020-03-16 DIAGNOSIS — J069 Acute upper respiratory infection, unspecified: Secondary | ICD-10-CM | POA: Diagnosis not present

## 2020-03-16 DIAGNOSIS — R350 Frequency of micturition: Secondary | ICD-10-CM | POA: Diagnosis not present

## 2020-03-16 DIAGNOSIS — J309 Allergic rhinitis, unspecified: Secondary | ICD-10-CM | POA: Diagnosis not present

## 2020-03-16 DIAGNOSIS — N951 Menopausal and female climacteric states: Secondary | ICD-10-CM | POA: Diagnosis not present

## 2020-03-23 ENCOUNTER — Other Ambulatory Visit: Payer: Self-pay

## 2020-03-23 ENCOUNTER — Ambulatory Visit (INDEPENDENT_AMBULATORY_CARE_PROVIDER_SITE_OTHER): Payer: BC Managed Care – PPO | Admitting: Internal Medicine

## 2020-03-23 ENCOUNTER — Encounter: Payer: Self-pay | Admitting: Internal Medicine

## 2020-03-23 DIAGNOSIS — E538 Deficiency of other specified B group vitamins: Secondary | ICD-10-CM | POA: Diagnosis not present

## 2020-03-23 DIAGNOSIS — E559 Vitamin D deficiency, unspecified: Secondary | ICD-10-CM | POA: Diagnosis not present

## 2020-03-23 DIAGNOSIS — E049 Nontoxic goiter, unspecified: Secondary | ICD-10-CM

## 2020-03-23 MED ORDER — AMPHETAMINE-DEXTROAMPHETAMINE 5 MG PO TABS: 5.0000 mg | ORAL_TABLET | Freq: Two times a day (BID) | ORAL | 0 refills | Status: DC

## 2020-03-23 NOTE — Progress Notes (Signed)
Subjective:  Patient ID: Jennifer [name], female    DOB: 02/06/1956  Age: 64 y.o. MRN: [id number]  CC: No chief complaint on file.   HPI Jennifer [name] presents for ADD, sleep disorder C/o ?goiter, ST Stress - husband was dx'd w/throat cancer  Outpatient Medications Prior to Visit  Medication Sig Dispense Refill  . amphetamine-dextroamphetamine (ADDERALL) 5 MG tablet Take 1 tablet (5 mg total) by mouth 2 (two) times daily. AM and lunch 60 tablet 0  . amphetamine-dextroamphetamine (ADDERALL) 5 MG tablet Take 1 tablet (5 mg total) by mouth 2 (two) times daily. AM and lunch 60 tablet 0  . amphetamine-dextroamphetamine (ADDERALL) 5 MG tablet Take 1 tablet (5 mg total) by mouth 2 (two) times daily. AM and lunch 60 tablet 0  . busPIRone (BUSPAR) 10 MG tablet Take 1 tablet (10 mg total) by mouth 2 (two) times daily. 60 tablet 11  . Cholecalciferol (VITAMIN D-3) 5000 UNITS TABS Take 1 tablet by mouth daily.     Marland Kitchen EPINEPHrine (EPIPEN JR 2-PAK) 0.15 MG/0.3ML injection Inject 0.3 mLs (0.15 mg total) into the muscle as needed for anaphylaxis. 2 each 1  . ESTRING 2 MG vaginal ring INSERT 1 RING VAGINALLY EVERY 3 MONTHS AS DIRECTED  0  . fish oil-omega-3 fatty acids 1000 MG capsule Take 2 g by mouth daily.      . Multiple Vitamin (MULTIVITAMIN WITH MINERALS) TABS Take 1 tablet by mouth daily.    . Nutritional Supplements (DHEA PO) Take 33 mg by mouth daily.     Marland Kitchen thyroid (NATURE-THROID) 32.5 MG tablet Nature-Throid 32.5 mg tablet    . vitamin C (ASCORBIC ACID) 500 MG tablet Take 1,000 mg by mouth daily.    . vitamin E 1000 UNIT capsule Take by mouth.     No facility-administered medications prior to visit.    ROS: Review of Systems  Constitutional: Negative for activity change, appetite change, chills, fatigue and unexpected weight change.  HENT: Positive for sore throat. Negative for congestion, mouth sores and sinus pressure.   Eyes: Positive for visual disturbance.  Respiratory:  Negative for cough and chest tightness.   Gastrointestinal: Negative for abdominal pain and nausea.  Genitourinary: Negative for difficulty urinating, frequency and vaginal pain.  Musculoskeletal: Negative for back pain and gait problem.  Skin: Negative for pallor and rash.  Neurological: Negative for dizziness, tremors, weakness, numbness and headaches.  Psychiatric/Behavioral: Positive for decreased concentration and sleep disturbance. Negative for confusion.    Objective:  BP (!) 122/52 (BP Location: Left Arm, Patient Position: Sitting, Cuff Size: Large)   Pulse 71   Temp 98.5 F (36.9 C) (Oral)   Ht 5\' 3"  (1.6 m)   Wt 172 lb (78 kg)   LMP 02/13/2007   SpO2 97%   BMI 30.47 kg/m   BP Readings from Last 3 Encounters:  03/23/20 (!) 122/52  06/10/19 128/80  09/03/18 134/76    Wt Readings from Last 3 Encounters:  03/23/20 172 lb (78 kg)  06/10/19 171 lb (77.6 kg)  09/03/18 160 lb (72.6 kg)    Physical Exam Constitutional:      General: She is not in acute distress.    Appearance: She is well-developed.  HENT:     Head: Normocephalic.     Right Ear: External ear normal.     Left Ear: External ear normal.     Nose: Nose normal.  Eyes:     General:        Right eye: No discharge.  Left eye: No discharge.     Conjunctiva/sclera: Conjunctivae normal.     Pupils: Pupils are equal, round, and reactive to light.  Neck:     Thyroid: No thyromegaly.     Vascular: No JVD.     Trachea: No tracheal deviation.  Cardiovascular:     Rate and Rhythm: Normal rate and regular rhythm.     Heart sounds: Normal heart sounds.  Pulmonary:     Effort: No respiratory distress.     Breath sounds: No stridor. No wheezing.  Abdominal:     General: Bowel sounds are normal. There is no distension.     Palpations: Abdomen is soft. There is no mass.     Tenderness: There is no abdominal tenderness. There is no guarding or rebound.  Musculoskeletal:        General: No tenderness.      Cervical back: Normal range of motion and neck supple.  Lymphadenopathy:     Cervical: No cervical adenopathy.  Skin:    Findings: No erythema or rash.  Neurological:     Cranial Nerves: No cranial nerve deficit.     Motor: No abnormal muscle tone.     Coordination: Coordination normal.     Deep Tendon Reflexes: Reflexes normal.  Psychiatric:        Behavior: Behavior normal.        Thought Content: Thought content normal.        Judgment: Judgment normal.   mild goiter, NT  Lab Results  Component Value Date   WBC 5.1 11/26/2019   HGB 13.5 11/26/2019   HCT 40.5 11/26/2019   PLT 307.0 11/26/2019   GLUCOSE 95 11/19/2014   CHOL 287 (H) 01/25/2011   TRIG 70.0 01/25/2011   HDL 96.50 01/25/2011   LDLDIRECT 202.5 01/25/2011   ALT 14 11/19/2014   AST 16 11/19/2014   NA 140 11/19/2014   K 4.5 11/19/2014   CL 104 11/19/2014   CREATININE 0.81 11/19/2014   BUN 17 11/19/2014   CO2 32 11/19/2014   TSH 1.91 11/26/2019   HGBA1C 5.7 04/10/2007    DG Chest 2 View  Result Date: 09/04/2018 CLINICAL DATA:  Acute bronchitis.  Cough EXAM: CHEST - 2 VIEW COMPARISON:  None. FINDINGS: The heart size and mediastinal contours are within normal limits. Both lungs are clear. The visualized skeletal structures are unremarkable. IMPRESSION: No active cardiopulmonary disease. Electronically Signed   By: Franchot Gallo M.D.   On: 09/04/2018 09:34    Assessment & Plan:    Walker Kehr, MD

## 2020-03-23 NOTE — Assessment & Plan Note (Signed)
US

## 2020-03-23 NOTE — Assessment & Plan Note (Signed)
Vit D 

## 2020-03-23 NOTE — Patient Instructions (Signed)
[place] started vaccine booster sign up. Please call [facility] at [phone]. You can also call the venue where you had your initial COVID 19 vaccination.   

## 2020-03-23 NOTE — Assessment & Plan Note (Signed)
On B12 

## 2020-03-29 ENCOUNTER — Encounter: Payer: Self-pay | Admitting: Internal Medicine

## 2020-04-06 DIAGNOSIS — N952 Postmenopausal atrophic vaginitis: Secondary | ICD-10-CM | POA: Diagnosis not present

## 2020-04-07 ENCOUNTER — Encounter: Payer: Self-pay | Admitting: Internal Medicine

## 2020-06-03 DIAGNOSIS — L570 Actinic keratosis: Secondary | ICD-10-CM | POA: Diagnosis not present

## 2020-06-03 DIAGNOSIS — L820 Inflamed seborrheic keratosis: Secondary | ICD-10-CM | POA: Diagnosis not present

## 2020-06-03 DIAGNOSIS — D485 Neoplasm of uncertain behavior of skin: Secondary | ICD-10-CM | POA: Diagnosis not present

## 2020-06-17 ENCOUNTER — Telehealth: Payer: Self-pay | Admitting: Internal Medicine

## 2020-06-17 NOTE — Telephone Encounter (Signed)
    Patient calling to request RX for anxiety. She states her husband has been diagnosed with cancer and she is stressed  Ph[facility]acy[id number] [place], [place] appointment at this time

## 2020-06-19 MED ORDER — LORAZEPAM 1 MG PO TABS: 0.5000 mg | ORAL_TABLET | Freq: Two times a day (BID) | ORAL | 1 refills | Status: DC | PRN

## 2020-06-19 NOTE — Telephone Encounter (Signed)
Okay.  Done.  Thanks 

## 2020-06-20 NOTE — Telephone Encounter (Signed)
Notified pt Md sent rx to [facility].Marland KitchenJohny Chess

## 2020-07-06 DIAGNOSIS — N952 Postmenopausal atrophic vaginitis: Secondary | ICD-10-CM | POA: Diagnosis not present

## 2020-07-07 DIAGNOSIS — Z85828 Personal history of other malignant neoplasm of skin: Secondary | ICD-10-CM | POA: Diagnosis not present

## 2020-07-07 DIAGNOSIS — L821 Other seborrheic keratosis: Secondary | ICD-10-CM | POA: Diagnosis not present

## 2020-07-07 DIAGNOSIS — D1801 Hemangioma of skin and subcutaneous tissue: Secondary | ICD-10-CM | POA: Diagnosis not present

## 2020-07-07 DIAGNOSIS — L814 Other melanin hyperpigmentation: Secondary | ICD-10-CM | POA: Diagnosis not present

## 2020-07-28 ENCOUNTER — Telehealth: Payer: Self-pay | Admitting: Internal Medicine

## 2020-07-28 DIAGNOSIS — L304 Erythema intertrigo: Secondary | ICD-10-CM | POA: Diagnosis not present

## 2020-07-28 DIAGNOSIS — L910 Hypertrophic scar: Secondary | ICD-10-CM | POA: Diagnosis not present

## 2020-07-28 DIAGNOSIS — Z85828 Personal history of other malignant neoplasm of skin: Secondary | ICD-10-CM | POA: Diagnosis not present

## 2020-07-28 MED ORDER — AMPHETAMINE-DEXTROAMPHETAMINE 5 MG PO TABS
5.0000 mg | ORAL_TABLET | Freq: Two times a day (BID) | ORAL | 0 refills | Status: DC
Start: 2020-07-28 — End: 2020-08-24

## 2020-07-28 NOTE — Telephone Encounter (Signed)
   Patient has no medication remaining. Requesting refill for amphetamine-dextroamphetamine (ADDERALL) 5 MG tablet Pharmacy [id number] - [place], [place] - [address]

## 2020-07-28 NOTE — Telephone Encounter (Signed)
Okay.  Done.  Schedule follow-up office visit.  Thanks

## 2020-08-11 ENCOUNTER — Ambulatory Visit: Payer: BC Managed Care – PPO | Admitting: Internal Medicine

## 2020-08-16 ENCOUNTER — Ambulatory Visit: Payer: BC Managed Care – PPO | Admitting: Internal Medicine

## 2020-08-24 ENCOUNTER — Encounter: Payer: Self-pay | Admitting: Internal Medicine

## 2020-08-24 ENCOUNTER — Ambulatory Visit (INDEPENDENT_AMBULATORY_CARE_PROVIDER_SITE_OTHER): Payer: BC Managed Care – PPO | Admitting: Internal Medicine

## 2020-08-24 ENCOUNTER — Other Ambulatory Visit: Payer: Self-pay

## 2020-08-24 VITALS — BP 128/82 | HR 67 | Temp 98.5°F | Wt 175.6 lb

## 2020-08-24 DIAGNOSIS — F900 Attention-deficit hyperactivity disorder, predominantly inattentive type: Secondary | ICD-10-CM | POA: Diagnosis not present

## 2020-08-24 DIAGNOSIS — E049 Nontoxic goiter, unspecified: Secondary | ICD-10-CM

## 2020-08-24 DIAGNOSIS — F4321 Adjustment disorder with depressed mood: Secondary | ICD-10-CM | POA: Insufficient documentation

## 2020-08-24 DIAGNOSIS — R002 Palpitations: Secondary | ICD-10-CM | POA: Diagnosis not present

## 2020-08-24 DIAGNOSIS — F418 Other specified anxiety disorders: Secondary | ICD-10-CM | POA: Diagnosis not present

## 2020-08-24 MED ORDER — AMPHETAMINE-DEXTROAMPHETAMINE 5 MG PO TABS
5.0000 mg | ORAL_TABLET | Freq: Two times a day (BID) | ORAL | 0 refills | Status: DC
Start: 2020-08-24 — End: 2020-09-27

## 2020-08-24 MED ORDER — PROPRANOLOL HCL 10 MG PO TABS: 10.0000 mg | ORAL_TABLET | Freq: Three times a day (TID) | ORAL | 3 refills | Status: DC | PRN

## 2020-08-24 NOTE — Progress Notes (Signed)
Subjective:  Patient ID: Jennifer [name], female    DOB: 24-Jun-1956  Age: 65 y.o. MRN: [id number]  CC: Shoulder Pain   HPI Jennifer [name] presents for stress, anxiety, ADD Jennifer [name]'s husband died of throat cancer in 07/20/2020, her dog died too C/o palpitations x 2 months  Outpatient Medications Prior to Visit  Medication Sig Dispense Refill  . amphetamine-dextroamphetamine (ADDERALL) 5 MG tablet Take 1 tablet (5 mg total) by mouth 2 (two) times daily. AM and lunch 60 tablet 0  . Cholecalciferol (VITAMIN D-3) 5000 UNITS TABS Take 1 tablet by mouth daily.    Marland Kitchen EPINEPHrine (EPIPEN JR 2-PAK) 0.15 MG/0.3ML injection Inject 0.3 mLs (0.15 mg total) into the muscle as needed for anaphylaxis. 2 each 1  . ESTRING 2 MG vaginal ring INSERT 1 RING VAGINALLY EVERY 3 MONTHS AS DIRECTED  0  . fish oil-omega-3 fatty acids 1000 MG capsule Take 2 g by mouth daily.    Marland Kitchen LORazepam (ATIVAN) 1 MG tablet Take 0.5-1 tablets (0.5-1 mg total) by mouth 2 (two) times daily as needed for anxiety. 60 tablet 1  . Multiple Vitamin (MULTIVITAMIN WITH MINERALS) TABS Take 1 tablet by mouth daily.    . Nutritional Supplements (DHEA PO) Take 33 mg by mouth daily.     Marland Kitchen thyroid (ARMOUR) 32.5 MG tablet Nature-Throid 32.5 mg tablet    . vitamin C (ASCORBIC ACID) 500 MG tablet Take 1,000 mg by mouth daily.    . vitamin E 1000 UNIT capsule Take by mouth.    Marland Kitchen amphetamine-dextroamphetamine (ADDERALL) 5 MG tablet Take 1 tablet (5 mg total) by mouth 2 (two) times daily. AM and lunch 60 tablet 0  . amphetamine-dextroamphetamine (ADDERALL) 5 MG tablet Take 1 tablet (5 mg total) by mouth 2 (two) times daily. AM and lunch 60 tablet 0  . busPIRone (BUSPAR) 10 MG tablet Take 1 tablet (10 mg total) by mouth 2 (two) times daily. (Patient not taking: Reported on 08/24/2020) 60 tablet 11   No facility-administered medications prior to visit.    ROS: Review of Systems  Constitutional: Negative for activity change, appetite change,  chills, fatigue and unexpected weight change.  HENT: Negative for congestion, mouth sores and sinus pressure.   Eyes: Negative for visual disturbance.  Respiratory: Negative for cough and chest tightness.   Gastrointestinal: Negative for abdominal pain and nausea.  Genitourinary: Negative for difficulty urinating, frequency and vaginal pain.  Musculoskeletal: Negative for back pain and gait problem.  Skin: Negative for pallor and rash.  Neurological: Negative for dizziness, tremors, weakness, numbness and headaches.  Psychiatric/Behavioral: Positive for sleep disturbance. Negative for confusion and suicidal ideas. The patient is nervous/anxious.     Objective:  BP 128/82 (BP Location: Left Arm)   Pulse 67   Temp 98.5 F (36.9 C) (Oral)   Wt 175 lb 9.6 oz (79.7 kg)   LMP 02/13/2007   SpO2 98%   BMI 31.11 kg/m   BP Readings from Last 3 Encounters:  08/24/20 128/82  03/23/20 (!) 122/52  06/10/19 128/80    Wt Readings from Last 3 Encounters:  08/24/20 175 lb 9.6 oz (79.7 kg)  03/23/20 172 lb (78 kg)  06/10/19 171 lb (77.6 kg)    Physical Exam Constitutional:      General: She is not in acute distress.    Appearance: She is well-developed.  HENT:     Head: Normocephalic.     Right Ear: External ear normal.     Left Ear: External ear normal.  Nose: Nose normal.     Mouth/Throat:     Mouth: Oropharynx is clear and moist.  Eyes:     General:        Right eye: No discharge.        Left eye: No discharge.     Conjunctiva/sclera: Conjunctivae normal.     Pupils: Pupils are equal, round, and reactive to light.  Neck:     Thyroid: No thyromegaly.     Vascular: No JVD.     Trachea: No tracheal deviation.  Cardiovascular:     Rate and Rhythm: Normal rate and regular rhythm.     Heart sounds: Normal heart sounds.  Pulmonary:     Effort: No respiratory distress.     Breath sounds: No stridor. No wheezing.  Abdominal:     General: Bowel sounds are normal. There is no  distension.     Palpations: Abdomen is soft. There is no mass.     Tenderness: There is no abdominal tenderness. There is no guarding or rebound.  Musculoskeletal:        General: No tenderness or edema.     Cervical back: Normal range of motion and neck supple.  Lymphadenopathy:     Cervical: No cervical adenopathy.  Skin:    Findings: No erythema or rash.  Neurological:     Cranial Nerves: No cranial nerve deficit.     Motor: No abnormal muscle tone.     Coordination: Coordination normal.     Deep Tendon Reflexes: Reflexes normal.  Psychiatric:        Mood and Affect: Mood and affect normal.        Behavior: Behavior normal.        Thought Content: Thought content normal.        Judgment: Judgment normal.    Procedure: EKG Indication: palpitations Impression: NSR. HR 66. No acute changes.    A total time of >45 minutes was spent preparing to see the patient, reviewing tests, x-rays, and outside records.  Also, obtaining history and performing comprehensive physical exam.  Additionally, counseling the patient regarding the above listed issues - stress, grief, depression.   Finally, documenting clinical information in the health records, coordination of care, educating the patient. It is a complex case.   Lab Results  Component Value Date   WBC 5.1 11/26/2019   HGB 13.5 11/26/2019   HCT 40.5 11/26/2019   PLT 307.0 11/26/2019   GLUCOSE 95 11/19/2014   CHOL 287 (H) 01/25/2011   TRIG 70.0 01/25/2011   HDL 96.50 01/25/2011   LDLDIRECT 202.5 01/25/2011   ALT 14 11/19/2014   AST 16 11/19/2014   NA 140 11/19/2014   K 4.5 11/19/2014   CL 104 11/19/2014   CREATININE 0.81 11/19/2014   BUN 17 11/19/2014   CO2 32 11/19/2014   TSH 1.91 11/26/2019   HGBA1C 5.7 04/10/2007    DG Chest 2 View  Result Date: 09/04/2018 CLINICAL DATA:  Acute bronchitis.  Cough EXAM: CHEST - 2 VIEW COMPARISON:  None. FINDINGS: The heart size and mediastinal contours are within normal limits. Both  lungs are clear. The visualized skeletal structures are unremarkable. IMPRESSION: No active cardiopulmonary disease. Electronically Signed   By: Franchot Gallo M.D.   On: 09/04/2018 09:34    Assessment & Plan:    Follow-up: No follow-ups on file.  Walker Kehr, MD

## 2020-08-24 NOTE — Assessment & Plan Note (Signed)
Worse.  Will use Lorazepam with caution PRN.  Potential benefits of a short/long term benzodiazepines  use as well as potential risks  and complications were explained to the patient and were aknowledged.

## 2020-08-24 NOTE — Assessment & Plan Note (Signed)
Worse  In counseling

## 2020-08-24 NOTE — Assessment & Plan Note (Addendum)
  Grace's husband died of throat cancer in 2020-07-28, her dog died too

## 2020-08-24 NOTE — Assessment & Plan Note (Addendum)
EKG NSR Obtain ECHO Cardiology consult Propranolol prn TSH, FT4, CMET

## 2020-08-24 NOTE — Assessment & Plan Note (Signed)
Chronic Low dose adderall - bid  Potential benefits of a long term adderall use as well as potential risks  and complications were explained to the patient and were aknowledged.

## 2020-08-29 DIAGNOSIS — J309 Allergic rhinitis, unspecified: Secondary | ICD-10-CM | POA: Diagnosis not present

## 2020-08-29 DIAGNOSIS — N951 Menopausal and female climacteric states: Secondary | ICD-10-CM | POA: Diagnosis not present

## 2020-08-29 DIAGNOSIS — R350 Frequency of micturition: Secondary | ICD-10-CM | POA: Diagnosis not present

## 2020-08-29 DIAGNOSIS — J069 Acute upper respiratory infection, unspecified: Secondary | ICD-10-CM | POA: Diagnosis not present

## 2020-08-30 ENCOUNTER — Telehealth: Payer: Self-pay | Admitting: Internal Medicine

## 2020-08-30 NOTE — Telephone Encounter (Signed)
Patient called and was wondering if a referral for Cardiology could be sent to  Dr. Fay Records at [facility].   Phone: [phone]

## 2020-08-31 ENCOUNTER — Other Ambulatory Visit: Payer: Self-pay | Admitting: Internal Medicine

## 2020-08-31 DIAGNOSIS — R002 Palpitations: Secondary | ICD-10-CM

## 2020-08-31 NOTE — Progress Notes (Signed)
Car ref  

## 2020-08-31 NOTE — Telephone Encounter (Signed)
Called pt there was no answer and can't leave msg due to vm being full. Will try later.Marland KitchenJohny [name]

## 2020-08-31 NOTE — Telephone Encounter (Signed)
Done. Thx.

## 2020-08-31 NOTE — Telephone Encounter (Signed)
Patient returned call

## 2020-09-01 ENCOUNTER — Encounter (HOSPITAL_COMMUNITY): Payer: Self-pay | Admitting: Internal Medicine

## 2020-09-01 NOTE — Telephone Encounter (Signed)
Notified pt MD sent referral../lmb

## 2020-09-06 ENCOUNTER — Telehealth (HOSPITAL_COMMUNITY): Payer: Self-pay | Admitting: Internal Medicine

## 2020-09-06 NOTE — Telephone Encounter (Signed)
Just an FYI. We have made several attempts to contact this patient including sending a letter to schedule or reschedule their echocardiogram. We will be removing the patient from the echo [place].   09/01/20 MAILED LETTER [facility]  09/01/20 called and VM full @ 10:55 [facility]   08/25/20 [facility] to schedule @ 4:02/[facility]      Thank you

## 2020-09-07 NOTE — Telephone Encounter (Signed)
Noted! Thank you

## 2020-09-21 DIAGNOSIS — Z01419 Encounter for gynecological examination (general) (routine) without abnormal findings: Secondary | ICD-10-CM | POA: Diagnosis not present

## 2020-09-21 DIAGNOSIS — Z1211 Encounter for screening for malignant neoplasm of colon: Secondary | ICD-10-CM | POA: Diagnosis not present

## 2020-09-21 DIAGNOSIS — N952 Postmenopausal atrophic vaginitis: Secondary | ICD-10-CM | POA: Diagnosis not present

## 2020-09-21 DIAGNOSIS — Z1231 Encounter for screening mammogram for malignant neoplasm of breast: Secondary | ICD-10-CM | POA: Diagnosis not present

## 2020-09-27 ENCOUNTER — Other Ambulatory Visit: Payer: Self-pay

## 2020-09-27 ENCOUNTER — Ambulatory Visit (INDEPENDENT_AMBULATORY_CARE_PROVIDER_SITE_OTHER): Payer: BC Managed Care – PPO | Admitting: Internal Medicine

## 2020-09-27 ENCOUNTER — Encounter: Payer: Self-pay | Admitting: Internal Medicine

## 2020-09-27 DIAGNOSIS — R002 Palpitations: Secondary | ICD-10-CM

## 2020-09-27 DIAGNOSIS — F4321 Adjustment disorder with depressed mood: Secondary | ICD-10-CM

## 2020-09-27 DIAGNOSIS — E538 Deficiency of other specified B group vitamins: Secondary | ICD-10-CM

## 2020-09-27 DIAGNOSIS — E559 Vitamin D deficiency, unspecified: Secondary | ICD-10-CM

## 2020-09-27 DIAGNOSIS — E034 Atrophy of thyroid (acquired): Secondary | ICD-10-CM

## 2020-09-27 MED ORDER — AMPHETAMINE-DEXTROAMPHETAMINE 5 MG PO TABS: 5.0000 mg | ORAL_TABLET | Freq: Two times a day (BID) | ORAL | 0 refills | Status: DC

## 2020-09-27 MED ORDER — AMPHETAMINE-DEXTROAMPHETAMINE 5 MG PO TABS
5.0000 mg | ORAL_TABLET | Freq: Two times a day (BID) | ORAL | 0 refills | Status: DC
Start: 2020-09-27 — End: 2021-01-25

## 2020-09-27 MED ORDER — ADDERALL 5 MG PO TABS: 5.0000 mg | ORAL_TABLET | Freq: Two times a day (BID) | ORAL | 0 refills | Status: DC

## 2020-09-27 NOTE — Assessment & Plan Note (Addendum)
On Armour thyroid

## 2020-09-27 NOTE — Assessment & Plan Note (Signed)
Discussed.

## 2020-09-27 NOTE — Assessment & Plan Note (Signed)
On Vit D 

## 2020-09-27 NOTE — Assessment & Plan Note (Addendum)
Discussed.  I think Jennifer [name] is doing a little better.

## 2020-09-27 NOTE — Assessment & Plan Note (Addendum)
On B12 inj-we will continue

## 2020-09-27 NOTE — Assessment & Plan Note (Signed)
Cardiology appt pending ECHO

## 2020-09-27 NOTE — Progress Notes (Signed)
Subjective:  Patient ID: Jennifer [name], female    DOB: [birth year]  Age: 65 y.o. MRN: [id number]  CC: Follow-up (6 month f/u)   HPI Jennifer [name] presents for palpitations, grief, sleep disorder  Outpatient Medications Prior to Visit  Medication Sig Dispense Refill  . amphetamine-dextroamphetamine (ADDERALL) 5 MG tablet Take 1 tablet (5 mg total) by mouth 2 (two) times daily. AM and lunch 60 tablet 0  . Cholecalciferol (VITAMIN D-3) 5000 UNITS TABS Take 1 tablet by mouth daily.    Marland Kitchen EPINEPHrine (EPIPEN JR 2-PAK) 0.15 MG/0.3ML injection Inject 0.3 mLs (0.15 mg total) into the muscle as needed for anaphylaxis. 2 each 1  . ESTRING 2 MG vaginal ring INSERT 1 RING VAGINALLY EVERY 3 MONTHS AS DIRECTED  0  . fish oil-omega-3 fatty acids 1000 MG capsule Take 2 g by mouth daily.    Marland Kitchen LORazepam (ATIVAN) 1 MG tablet Take 0.5-1 tablets (0.5-1 mg total) by mouth 2 (two) times daily as needed for anxiety. 60 tablet 1  . Multiple Vitamin (MULTIVITAMIN WITH MINERALS) TABS Take 1 tablet by mouth daily.    . Nutritional Supplements (DHEA PO) Take 33 mg by mouth daily.     . propranolol (INDERAL) 10 MG tablet Take 1 tablet (10 mg total) by mouth 3 (three) times daily as needed. Palpitations 90 tablet 3  . thyroid (ARMOUR) 32.5 MG tablet Nature-Throid 32.5 mg tablet    . vitamin C (ASCORBIC ACID) 500 MG tablet Take 1,000 mg by mouth daily.    . vitamin E 1000 UNIT capsule Take by mouth.     No facility-administered medications prior to visit.    ROS: Review of Systems  Constitutional: Positive for fatigue. Negative for activity change, appetite change, chills and unexpected weight change.  HENT: Negative for congestion, mouth sores and sinus pressure.   Eyes: Negative for visual disturbance.  Respiratory: Negative for cough, chest tightness and wheezing.   Cardiovascular: Negative for palpitations and leg swelling.  Gastrointestinal: Negative for abdominal pain and nausea.  Genitourinary:  Negative for difficulty urinating, frequency and vaginal pain.  Musculoskeletal: Negative for back pain and gait problem.  Skin: Negative for pallor and rash.  Neurological: Negative for dizziness, tremors, weakness, numbness and headaches.  Psychiatric/Behavioral: Positive for dysphoric mood and sleep disturbance. Negative for confusion, self-injury and suicidal ideas. The patient is nervous/anxious.     Objective:  BP 118/62 (BP Location: Left Arm)   Pulse 68   Temp 98.6 F (37 C) (Oral)   Ht 5\' 3"  (1.6 m)   Wt 178 lb 6.4 oz (80.9 kg)   LMP 02/13/2007   SpO2 98%   BMI 31.60 kg/m   BP Readings from Last 3 Encounters:  09/27/20 118/62  08/24/20 128/82  03/23/20 (!) 122/52    Wt Readings from Last 3 Encounters:  09/27/20 178 lb 6.4 oz (80.9 kg)  08/24/20 175 lb 9.6 oz (79.7 kg)  03/23/20 172 lb (78 kg)    Physical Exam Constitutional:      General: She is not in acute distress.    Appearance: She is well-developed. She is obese.  HENT:     Head: Normocephalic.     Right Ear: External ear normal.     Left Ear: External ear normal.     Nose: Nose normal.     Mouth/Throat:     Mouth: Oropharynx is clear and moist.  Eyes:     General:        Right eye: No discharge.  Left eye: No discharge.     Conjunctiva/sclera: Conjunctivae normal.     Pupils: Pupils are equal, round, and reactive to light.  Neck:     Thyroid: No thyromegaly.     Vascular: No JVD.     Trachea: No tracheal deviation.  Cardiovascular:     Rate and Rhythm: Normal rate and regular rhythm.     Heart sounds: Normal heart sounds.  Pulmonary:     Effort: No respiratory distress.     Breath sounds: No stridor. No wheezing.  Abdominal:     General: Bowel sounds are normal. There is no distension.     Palpations: Abdomen is soft. There is no mass.     Tenderness: There is no abdominal tenderness. There is no guarding or rebound.  Musculoskeletal:        General: No tenderness or edema.      Cervical back: Normal range of motion and neck supple.  Lymphadenopathy:     Cervical: No cervical adenopathy.  Skin:    Findings: No erythema or rash.  Neurological:     Mental Status: She is oriented to person, place, and time.     Cranial Nerves: No cranial nerve deficit.     Motor: No abnormal muscle tone.     Coordination: Coordination normal.     Gait: Gait normal.     Deep Tendon Reflexes: Reflexes normal.  Psychiatric:        Mood and Affect: Mood and affect normal.        Behavior: Behavior normal.        Thought Content: Thought content normal.        Judgment: Judgment normal.    sad  Lab Results  Component Value Date   WBC 5.1 11/26/2019   HGB 13.5 11/26/2019   HCT 40.5 11/26/2019   PLT 307.0 11/26/2019   GLUCOSE 95 11/19/2014   CHOL 287 (H) 01/25/2011   TRIG 70.0 01/25/2011   HDL 96.50 01/25/2011   LDLDIRECT 202.5 01/25/2011   ALT 14 11/19/2014   AST 16 11/19/2014   NA 140 11/19/2014   K 4.5 11/19/2014   CL 104 11/19/2014   CREATININE 0.81 11/19/2014   BUN 17 11/19/2014   CO2 32 11/19/2014   TSH 1.91 11/26/2019   HGBA1C 5.7 04/10/2007    DG Chest 2 View  Result Date: 09/04/2018 CLINICAL DATA:  Acute bronchitis.  Cough EXAM: CHEST - 2 VIEW COMPARISON:  None. FINDINGS: The heart size and mediastinal contours are within normal limits. Both lungs are clear. The visualized skeletal structures are unremarkable. IMPRESSION: No active cardiopulmonary disease. Electronically Signed   By: Franchot Gallo M.D.   On: 09/04/2018 09:34    Assessment & Plan:   There are no diagnoses linked to this encounter.   No orders of the defined types were placed in this encounter.    Follow-up: No follow-ups on file.  Walker Kehr, MD

## 2020-10-04 ENCOUNTER — Other Ambulatory Visit: Payer: Self-pay | Admitting: Internal Medicine

## 2020-10-05 ENCOUNTER — Ambulatory Visit: Payer: BC Managed Care – PPO | Admitting: Internal Medicine

## 2020-10-06 DIAGNOSIS — Z634 Disappearance and death of family member: Secondary | ICD-10-CM | POA: Diagnosis not present

## 2020-10-06 DIAGNOSIS — N952 Postmenopausal atrophic vaginitis: Secondary | ICD-10-CM | POA: Diagnosis not present

## 2020-10-12 ENCOUNTER — Telehealth: Payer: Self-pay | Admitting: *Deleted

## 2020-10-12 DIAGNOSIS — Z85828 Personal history of other malignant neoplasm of skin: Secondary | ICD-10-CM | POA: Diagnosis not present

## 2020-10-12 DIAGNOSIS — B351 Tinea unguium: Secondary | ICD-10-CM | POA: Diagnosis not present

## 2020-10-12 NOTE — Telephone Encounter (Signed)
Completed PA via cover-my-meds w/ (Key: [id number]). Waiting on response.Marland KitchenJohny Chess

## 2020-10-12 NOTE — Telephone Encounter (Signed)
Rec'd [facility] from [facility] stating pt is wanting the Kentaro Alewine name Adderall 5 mg which is not covered by insurance. Pt is wanting PA for Holger Sokolowski name only. PA # [phone].Marland KitchenJohny Chess

## 2020-10-13 DIAGNOSIS — R002 Palpitations: Secondary | ICD-10-CM | POA: Diagnosis not present

## 2020-10-13 NOTE — Telephone Encounter (Signed)
Juliann Pulse w/ [employer] called and was wondering if it was supposed to be immediate release or the extended release for Adderall 5 mg. Please advise  Phone: [phone]

## 2020-10-13 NOTE — Telephone Encounter (Signed)
Notified [employer] spke w/rep Colletta [place] inform her MD has Adderal 5 mg twice a day...Johny Chess

## 2020-10-14 NOTE — Telephone Encounter (Signed)
Rec'd PA back med was denied.. it states request does not meet the medical necessity founf in members plan. The allowed amount is 1 pill per day unless same dose cannot be made from a lower amount or higher strength. Faxed info to pharmacy.Marland KitchenJohny Chess

## 2020-10-14 NOTE — Telephone Encounter (Signed)
[employer] [place] also calling to provide alternative to Adderal. Methylphenidate and Dextroamphetamine  Phone [phone] Ref# [id number]

## 2020-10-27 DIAGNOSIS — F32A Depression, unspecified: Secondary | ICD-10-CM | POA: Diagnosis not present

## 2020-10-27 DIAGNOSIS — F419 Anxiety disorder, unspecified: Secondary | ICD-10-CM | POA: Diagnosis not present

## 2020-11-01 DIAGNOSIS — E782 Mixed hyperlipidemia: Secondary | ICD-10-CM | POA: Diagnosis not present

## 2020-11-01 DIAGNOSIS — E039 Hypothyroidism, unspecified: Secondary | ICD-10-CM | POA: Diagnosis not present

## 2020-11-01 DIAGNOSIS — D509 Iron deficiency anemia, unspecified: Secondary | ICD-10-CM | POA: Diagnosis not present

## 2020-11-01 DIAGNOSIS — E539 Vitamin B deficiency, unspecified: Secondary | ICD-10-CM | POA: Diagnosis not present

## 2020-11-01 DIAGNOSIS — R5381 Other malaise: Secondary | ICD-10-CM | POA: Diagnosis not present

## 2020-11-01 DIAGNOSIS — R079 Chest pain, unspecified: Secondary | ICD-10-CM | POA: Diagnosis not present

## 2020-11-01 DIAGNOSIS — E7212 Methylenetetrahydrofolate reductase deficiency: Secondary | ICD-10-CM | POA: Diagnosis not present

## 2020-11-01 DIAGNOSIS — E049 Nontoxic goiter, unspecified: Secondary | ICD-10-CM | POA: Diagnosis not present

## 2020-11-03 DIAGNOSIS — F32A Depression, unspecified: Secondary | ICD-10-CM | POA: Diagnosis not present

## 2020-11-03 DIAGNOSIS — Z634 Disappearance and death of family member: Secondary | ICD-10-CM | POA: Diagnosis not present

## 2020-11-03 DIAGNOSIS — F419 Anxiety disorder, unspecified: Secondary | ICD-10-CM | POA: Diagnosis not present

## 2020-11-09 DIAGNOSIS — I340 Nonrheumatic mitral (valve) insufficiency: Secondary | ICD-10-CM | POA: Diagnosis not present

## 2020-11-09 DIAGNOSIS — I517 Cardiomegaly: Secondary | ICD-10-CM | POA: Diagnosis not present

## 2020-11-09 DIAGNOSIS — I080 Rheumatic disorders of both mitral and aortic valves: Secondary | ICD-10-CM | POA: Diagnosis not present

## 2020-11-17 DIAGNOSIS — J309 Allergic rhinitis, unspecified: Secondary | ICD-10-CM | POA: Diagnosis not present

## 2020-11-17 DIAGNOSIS — F419 Anxiety disorder, unspecified: Secondary | ICD-10-CM | POA: Diagnosis not present

## 2020-11-17 DIAGNOSIS — R350 Frequency of micturition: Secondary | ICD-10-CM | POA: Diagnosis not present

## 2020-11-17 DIAGNOSIS — N951 Menopausal and female climacteric states: Secondary | ICD-10-CM | POA: Diagnosis not present

## 2020-11-17 DIAGNOSIS — J069 Acute upper respiratory infection, unspecified: Secondary | ICD-10-CM | POA: Diagnosis not present

## 2020-11-17 DIAGNOSIS — F4321 Adjustment disorder with depressed mood: Secondary | ICD-10-CM | POA: Diagnosis not present

## 2020-11-17 DIAGNOSIS — F32A Depression, unspecified: Secondary | ICD-10-CM | POA: Diagnosis not present

## 2020-11-24 DIAGNOSIS — F419 Anxiety disorder, unspecified: Secondary | ICD-10-CM | POA: Diagnosis not present

## 2020-11-24 DIAGNOSIS — F4321 Adjustment disorder with depressed mood: Secondary | ICD-10-CM | POA: Diagnosis not present

## 2020-12-08 DIAGNOSIS — F419 Anxiety disorder, unspecified: Secondary | ICD-10-CM | POA: Diagnosis not present

## 2020-12-08 DIAGNOSIS — F4321 Adjustment disorder with depressed mood: Secondary | ICD-10-CM | POA: Diagnosis not present

## 2020-12-13 DIAGNOSIS — F4321 Adjustment disorder with depressed mood: Secondary | ICD-10-CM | POA: Diagnosis not present

## 2020-12-13 DIAGNOSIS — F419 Anxiety disorder, unspecified: Secondary | ICD-10-CM | POA: Diagnosis not present

## 2020-12-22 DIAGNOSIS — F4321 Adjustment disorder with depressed mood: Secondary | ICD-10-CM | POA: Diagnosis not present

## 2020-12-22 DIAGNOSIS — F411 Generalized anxiety disorder: Secondary | ICD-10-CM | POA: Diagnosis not present

## 2021-01-09 ENCOUNTER — Ambulatory Visit: Payer: BC Managed Care – PPO | Admitting: Internal Medicine

## 2021-01-09 DIAGNOSIS — F4321 Adjustment disorder with depressed mood: Secondary | ICD-10-CM | POA: Diagnosis not present

## 2021-01-09 DIAGNOSIS — F411 Generalized anxiety disorder: Secondary | ICD-10-CM | POA: Diagnosis not present

## 2021-01-10 DIAGNOSIS — M810 Age-related osteoporosis without current pathological fracture: Secondary | ICD-10-CM | POA: Diagnosis not present

## 2021-01-10 DIAGNOSIS — N952 Postmenopausal atrophic vaginitis: Secondary | ICD-10-CM | POA: Diagnosis not present

## 2021-01-10 DIAGNOSIS — Z6834 Body mass index (BMI) 34.0-34.9, adult: Secondary | ICD-10-CM | POA: Diagnosis not present

## 2021-01-10 DIAGNOSIS — F419 Anxiety disorder, unspecified: Secondary | ICD-10-CM | POA: Diagnosis not present

## 2021-01-17 ENCOUNTER — Ambulatory Visit: Payer: Self-pay | Admitting: Internal Medicine

## 2021-01-17 DIAGNOSIS — F4321 Adjustment disorder with depressed mood: Secondary | ICD-10-CM | POA: Diagnosis not present

## 2021-01-17 DIAGNOSIS — F4310 Post-traumatic stress disorder, unspecified: Secondary | ICD-10-CM | POA: Diagnosis not present

## 2021-01-17 DIAGNOSIS — F419 Anxiety disorder, unspecified: Secondary | ICD-10-CM | POA: Diagnosis not present

## 2021-01-24 DIAGNOSIS — F4310 Post-traumatic stress disorder, unspecified: Secondary | ICD-10-CM | POA: Diagnosis not present

## 2021-01-24 DIAGNOSIS — F4321 Adjustment disorder with depressed mood: Secondary | ICD-10-CM | POA: Diagnosis not present

## 2021-01-24 DIAGNOSIS — F419 Anxiety disorder, unspecified: Secondary | ICD-10-CM | POA: Diagnosis not present

## 2021-01-25 ENCOUNTER — Encounter: Payer: Self-pay | Admitting: Internal Medicine

## 2021-01-25 ENCOUNTER — Ambulatory Visit (INDEPENDENT_AMBULATORY_CARE_PROVIDER_SITE_OTHER): Payer: BC Managed Care – PPO | Admitting: Internal Medicine

## 2021-01-25 ENCOUNTER — Other Ambulatory Visit: Payer: Self-pay

## 2021-01-25 DIAGNOSIS — Z6831 Body mass index (BMI) 31.0-31.9, adult: Secondary | ICD-10-CM

## 2021-01-25 DIAGNOSIS — E6609 Other obesity due to excess calories: Secondary | ICD-10-CM | POA: Diagnosis not present

## 2021-01-25 DIAGNOSIS — F4321 Adjustment disorder with depressed mood: Secondary | ICD-10-CM | POA: Diagnosis not present

## 2021-01-25 DIAGNOSIS — E559 Vitamin D deficiency, unspecified: Secondary | ICD-10-CM | POA: Diagnosis not present

## 2021-01-25 DIAGNOSIS — E538 Deficiency of other specified B group vitamins: Secondary | ICD-10-CM | POA: Diagnosis not present

## 2021-01-25 DIAGNOSIS — F900 Attention-deficit hyperactivity disorder, predominantly inattentive type: Secondary | ICD-10-CM

## 2021-01-25 MED ORDER — AMPHETAMINE-DEXTROAMPHETAMINE 5 MG PO TABS: 5.0000 mg | ORAL_TABLET | Freq: Two times a day (BID) | ORAL | 0 refills | Status: DC

## 2021-01-25 NOTE — Assessment & Plan Note (Signed)
Cont w/B12 

## 2021-01-25 NOTE — Assessment & Plan Note (Signed)
Dr Cletis Media prescribed Zoloft recently. Pt took 25 mg/d - she wants to stop

## 2021-01-25 NOTE — Assessment & Plan Note (Signed)
Wt Readings from Last 3 Encounters:  01/25/21 180 lb 3.2 oz (81.7 kg)  09/27/20 178 lb 6.4 oz (80.9 kg)  08/24/20 175 lb 9.6 oz (79.7 kg)

## 2021-01-25 NOTE — Patient Instructions (Addendum)
The Obesity Code book by Sharman Cheek   These suggestions will probably help you to improve your metabolism if you are not overweight and to lose weight if you are overweight: 1.  Reduce your consumption of sugars and starches.  Eliminate high fructose corn syrup from your diet.  Reduce your consumption of processed foods.  For desserts try to have seasonal fruits, berries, nuts, cheeses or dark chocolate with more than 70% cacao. 2.  Do not snack 3.  You do not have to eat breakfast.  If you choose to have breakfast - eat plain greek yogurt, eggs, oatmeal (without sugar) - use honey if you need to. 4.  Drink water, freshly brewed unsweetened tea (green, black or herbal) or coffee.  Do not drink sodas including diet sodas , juices, beverages sweetened with artificial sweeteners. 5.  Reduce your consumption of refined grains. 6.  Avoid protein drinks such as Optifast, Slim fast etc. Eat chicken, fish, meat, dairy and beans for your sources of protein. 7.  Natural unprocessed fats like cold pressed virgin olive oil, butter, coconut oil are good for you.  Eat avocados. 8.  Increase your consumption of fiber.  Fruits, berries, vegetables, whole grains, flaxseed, chia seeds, beans, popcorn, nuts, oatmeal are good sources of fiber 9.  Use vinegar in your diet, i.e. apple cider vinegar, red wine or balsamic vinegar 10.  You can try fasting.  For example you can skip breakfast and lunch every other day (24-hour fast) 11.  Stress reduction, good night sleep, relaxation, meditation, yoga and other physical activity is likely to help you to maintain low weight too. 12.  If you drink alcohol, limit your alcohol intake to no more than 2 drinks a day.    Cabbage soup recipe that will not make you gain weight: Take 1 small head of cabbage, 1 average pack of celery, 4 green peppers, 4 onions, 2 cans diced tomatoes (they are not available without salt), salt and spices to taste.  Chop cabbage, celery, peppers and  onions.  And tomatoes and 2-2.5 liters (2.5 quarts) of water so that it would just cover the vegetables.  Bring to boil.  Add spices and salt.  Turn heat to low/medium and simmer for 20-25 minutes.  Naturally, you can make a smaller batch and change some of the ingredients.  You can try Lion's Mane Mushroom capsules for memory problems, decreased focus, mental fog, neuropathy ([email or web address])  Angelyn Punt owner Geneticist, molecular    [email or web address]

## 2021-01-25 NOTE — Progress Notes (Signed)
Subjective:  Patient ID: Jennifer [name], female    DOB: 1956/03/14  Age: 65 y.o. MRN: [id number]  CC: Follow-up (3 month f/u)   HPI Jennifer [name] presents for grief, anxiety, depression F/u on ADD, sleep disorder Dr Cletis Media prescribed Zoloft recently. Pt took 25 mg/d.   Outpatient Medications Prior to Visit  Medication Sig Dispense Refill   ADDERALL 5 MG tablet Take 1 tablet (5 mg total) by mouth 2 (two) times daily. Morning and lunch 60 tablet 0   Cholecalciferol (VITAMIN D-3) 5000 UNITS TABS Take 1 tablet by mouth daily.     EPINEPHrine (EPIPEN JR 2-PAK) 0.15 MG/0.3ML injection Inject 0.3 mLs (0.15 mg total) into the muscle as needed for anaphylaxis. 2 each 1   ESTRING 2 MG vaginal ring INSERT 1 RING VAGINALLY EVERY 3 MONTHS AS DIRECTED  0   fish oil-omega-3 fatty acids 1000 MG capsule Take 2 g by mouth daily.     LORazepam (ATIVAN) 1 MG tablet TAKE 0.5-1 TABLETS (0.5-1 MG TOTAL) BY MOUTH 2 (TWO) TIMES DAILY AS NEEDED FOR ANXIETY. 60 tablet 1   Multiple Vitamin (MULTIVITAMIN WITH MINERALS) TABS Take 1 tablet by mouth daily.     Nutritional Supplements (DHEA PO) Take 33 mg by mouth daily.      sertraline (ZOLOFT) 25 MG tablet Take 1 by mouth every day     thyroid (ARMOUR) 32.5 MG tablet Nature-Throid 32.5 mg tablet     vitamin C (ASCORBIC ACID) 500 MG tablet Take 1,000 mg by mouth daily.     vitamin E 1000 UNIT capsule Take by mouth.     amphetamine-dextroamphetamine (ADDERALL) 5 MG tablet Take 1 tablet (5 mg total) by mouth 2 (two) times daily. AM and lunch 60 tablet 0   amphetamine-dextroamphetamine (ADDERALL) 5 MG tablet Take 1 tablet (5 mg total) by mouth 2 (two) times daily. AM and lunch 60 tablet 0   propranolol (INDERAL) 10 MG tablet Take 1 tablet (10 mg total) by mouth 3 (three) times daily as needed. Palpitations (Patient not taking: Reported on 01/25/2021) 90 tablet 3   No facility-administered medications prior to visit.    ROS: Review of Systems  Constitutional:   Positive for fatigue and unexpected weight change. Negative for activity change, appetite change and chills.  HENT:  Negative for congestion, mouth sores and sinus pressure.   Eyes:  Positive for visual disturbance.  Respiratory:  Negative for cough and chest tightness.   Gastrointestinal:  Negative for abdominal pain and nausea.  Genitourinary:  Negative for difficulty urinating, frequency and vaginal pain.  Musculoskeletal:  Negative for back pain and gait problem.  Skin:  Negative for pallor and rash.  Neurological:  Negative for dizziness, tremors, weakness, numbness and headaches.  Psychiatric/Behavioral:  Positive for decreased concentration, dysphoric mood and sleep disturbance. Negative for agitation, behavioral problems, confusion, self-injury and suicidal ideas. The patient is nervous/anxious. The patient is not hyperactive.    Objective:  BP 120/60 (BP Location: Left Arm)   Pulse 70   Temp 99 F (37.2 C) (Oral)   Ht 5\' 3"  (1.6 m)   Wt 180 lb 3.2 oz (81.7 kg)   LMP 02/13/2007   SpO2 97%   BMI 31.92 kg/m   BP Readings from Last 3 Encounters:  01/25/21 120/60  09/27/20 118/62  08/24/20 128/82    Wt Readings from Last 3 Encounters:  01/25/21 180 lb 3.2 oz (81.7 kg)  09/27/20 178 lb 6.4 oz (80.9 kg)  08/24/20 175 lb 9.6 oz (79.7  kg)    Physical Exam Constitutional:      General: She is not in acute distress.    Appearance: She is well-developed. She is obese.  HENT:     Head: Normocephalic.     Right Ear: External ear normal.     Left Ear: External ear normal.     Nose: Nose normal.  Eyes:     General:        Right eye: No discharge.        Left eye: No discharge.     Conjunctiva/sclera: Conjunctivae normal.     Pupils: Pupils are equal, round, and reactive to light.  Neck:     Thyroid: No thyromegaly.     Vascular: No JVD.     Trachea: No tracheal deviation.  Cardiovascular:     Rate and Rhythm: Normal rate and regular rhythm.     Heart sounds: Normal  heart sounds.  Pulmonary:     Effort: No respiratory distress.     Breath sounds: No stridor. No wheezing.  Abdominal:     General: Bowel sounds are normal. There is no distension.     Palpations: Abdomen is soft. There is no mass.     Tenderness: There is no abdominal tenderness. There is no guarding or rebound.  Musculoskeletal:        General: No tenderness.     Cervical back: Normal range of motion and neck supple. No rigidity.  Lymphadenopathy:     Cervical: No cervical adenopathy.  Skin:    Findings: No erythema or rash.  Neurological:     Mental Status: She is oriented to person, place, and time.     Cranial Nerves: No cranial nerve deficit.     Motor: No abnormal muscle tone.     Coordination: Coordination normal.     Deep Tendon Reflexes: Reflexes normal.  Psychiatric:        Behavior: Behavior normal.        Thought Content: Thought content normal.        Judgment: Judgment normal.  Sad   A total time of  47 minutes was spent preparing to see the patient, reviewing tests, x-rays, operative reports and outside records.  Also, obtaining history and counseling the patient regarding depression, grief, ADD. Finally, documenting clinical information in the health records, coordination of care, educating the patient. It is a complex case.  Lab Results  Component Value Date   WBC 5.1 11/26/2019   HGB 13.5 11/26/2019   HCT 40.5 11/26/2019   PLT 307.0 11/26/2019   GLUCOSE 95 11/19/2014   CHOL 287 (H) 01/25/2011   TRIG 70.0 01/25/2011   HDL 96.50 01/25/2011   LDLDIRECT 202.5 01/25/2011   ALT 14 11/19/2014   AST 16 11/19/2014   NA 140 11/19/2014   K 4.5 11/19/2014   CL 104 11/19/2014   CREATININE 0.81 11/19/2014   BUN 17 11/19/2014   CO2 32 11/19/2014   TSH 1.91 11/26/2019   HGBA1C 5.7 04/10/2007    DG Chest 2 View  Result Date: 09/04/2018 CLINICAL DATA:  Acute bronchitis.  Cough EXAM: CHEST - 2 VIEW COMPARISON:  None. FINDINGS: The heart size and mediastinal  contours are within normal limits. Both lungs are clear. The visualized skeletal structures are unremarkable. IMPRESSION: No active cardiopulmonary disease. Electronically Signed   By: Franchot Gallo M.D.   On: 09/04/2018 09:34    Assessment & Plan:     Walker Kehr, MD

## 2021-01-25 NOTE — Assessment & Plan Note (Signed)
In counseling

## 2021-01-25 NOTE — Assessment & Plan Note (Signed)
Try Lions mane Adderall po  Potential benefits of a long term amphetamines  use as well as potential risks  and complications were explained to the patient and were aknowledged.

## 2021-01-25 NOTE — Assessment & Plan Note (Signed)
Take Vit D 

## 2021-02-01 DIAGNOSIS — H2513 Age-related nuclear cataract, bilateral: Secondary | ICD-10-CM | POA: Diagnosis not present

## 2021-02-01 DIAGNOSIS — H5440 Blindness, one eye, unspecified eye: Secondary | ICD-10-CM | POA: Diagnosis not present

## 2021-02-09 DIAGNOSIS — F32A Depression, unspecified: Secondary | ICD-10-CM | POA: Diagnosis not present

## 2021-02-09 DIAGNOSIS — F4310 Post-traumatic stress disorder, unspecified: Secondary | ICD-10-CM | POA: Diagnosis not present

## 2021-02-09 DIAGNOSIS — F4321 Adjustment disorder with depressed mood: Secondary | ICD-10-CM | POA: Diagnosis not present

## 2021-02-09 DIAGNOSIS — F419 Anxiety disorder, unspecified: Secondary | ICD-10-CM | POA: Diagnosis not present

## 2021-02-23 DIAGNOSIS — L820 Inflamed seborrheic keratosis: Secondary | ICD-10-CM | POA: Diagnosis not present

## 2021-02-23 DIAGNOSIS — C44722 Squamous cell carcinoma of skin of right lower limb, including hip: Secondary | ICD-10-CM | POA: Diagnosis not present

## 2021-02-28 DIAGNOSIS — F32A Depression, unspecified: Secondary | ICD-10-CM | POA: Diagnosis not present

## 2021-02-28 DIAGNOSIS — F4321 Adjustment disorder with depressed mood: Secondary | ICD-10-CM | POA: Diagnosis not present

## 2021-02-28 DIAGNOSIS — F4310 Post-traumatic stress disorder, unspecified: Secondary | ICD-10-CM | POA: Diagnosis not present

## 2021-04-25 ENCOUNTER — Ambulatory Visit: Payer: BC Managed Care – PPO | Admitting: Dermatology

## 2021-05-25 ENCOUNTER — Other Ambulatory Visit: Payer: Self-pay | Admitting: Family Medicine

## 2021-05-25 DIAGNOSIS — E049 Nontoxic goiter, unspecified: Secondary | ICD-10-CM

## 2021-05-25 DIAGNOSIS — R1310 Dysphagia, unspecified: Secondary | ICD-10-CM

## 2021-06-02 DIAGNOSIS — N3281 Overactive bladder: Secondary | ICD-10-CM | POA: Insufficient documentation

## 2021-06-07 ENCOUNTER — Ambulatory Visit
Admission: RE | Admit: 2021-06-07 | Discharge: 2021-06-07 | Disposition: A | Discharge status: Home / Self Care | Payer: BC Managed Care – PPO | Source: Ambulatory Visit | Attending: Family Medicine | Admitting: Family Medicine

## 2021-06-07 DIAGNOSIS — R1310 Dysphagia, unspecified: Secondary | ICD-10-CM

## 2021-06-07 DIAGNOSIS — E049 Nontoxic goiter, unspecified: Secondary | ICD-10-CM

## 2021-06-18 DIAGNOSIS — Z0289 Encounter for other administrative examinations: Secondary | ICD-10-CM

## 2021-07-25 ENCOUNTER — Ambulatory Visit: Payer: BC Managed Care – PPO | Admitting: Dermatology

## 2021-08-01 ENCOUNTER — Ambulatory Visit (INDEPENDENT_AMBULATORY_CARE_PROVIDER_SITE_OTHER): Payer: Self-pay | Admitting: Family Medicine

## 2021-08-03 ENCOUNTER — Encounter (INDEPENDENT_AMBULATORY_CARE_PROVIDER_SITE_OTHER): Payer: Self-pay

## 2021-08-03 ENCOUNTER — Ambulatory Visit (INDEPENDENT_AMBULATORY_CARE_PROVIDER_SITE_OTHER): Payer: Self-pay | Admitting: Family Medicine

## 2021-08-15 ENCOUNTER — Ambulatory Visit (INDEPENDENT_AMBULATORY_CARE_PROVIDER_SITE_OTHER): Payer: Self-pay | Admitting: Family Medicine

## 2021-08-17 ENCOUNTER — Ambulatory Visit (INDEPENDENT_AMBULATORY_CARE_PROVIDER_SITE_OTHER): Payer: Self-pay | Admitting: Family Medicine

## 2021-08-24 ENCOUNTER — Ambulatory Visit (INDEPENDENT_AMBULATORY_CARE_PROVIDER_SITE_OTHER): Payer: Self-pay | Admitting: Family Medicine

## 2021-09-26 ENCOUNTER — Ambulatory Visit: Payer: BC Managed Care – PPO | Admitting: Internal Medicine

## 2021-10-02 ENCOUNTER — Ambulatory Visit: Payer: BC Managed Care – PPO | Admitting: Internal Medicine

## 2021-10-09 ENCOUNTER — Ambulatory Visit (INDEPENDENT_AMBULATORY_CARE_PROVIDER_SITE_OTHER): Payer: BC Managed Care – PPO | Admitting: Internal Medicine

## 2021-10-09 ENCOUNTER — Encounter: Payer: Self-pay | Admitting: Internal Medicine

## 2021-10-09 ENCOUNTER — Other Ambulatory Visit: Payer: Self-pay

## 2021-10-09 VITALS — BP 118/60 | HR 39 | Temp 98.3°F | Ht 63.0 in | Wt 182.0 lb

## 2021-10-09 DIAGNOSIS — R739 Hyperglycemia, unspecified: Secondary | ICD-10-CM | POA: Diagnosis not present

## 2021-10-09 DIAGNOSIS — E034 Atrophy of thyroid (acquired): Secondary | ICD-10-CM | POA: Diagnosis not present

## 2021-10-09 DIAGNOSIS — F4321 Adjustment disorder with depressed mood: Secondary | ICD-10-CM | POA: Diagnosis not present

## 2021-10-09 DIAGNOSIS — R635 Abnormal weight gain: Secondary | ICD-10-CM

## 2021-10-09 DIAGNOSIS — E538 Deficiency of other specified B group vitamins: Secondary | ICD-10-CM

## 2021-10-09 LAB — HEMOGLOBIN A1C: Hgb A1c MFr Bld: 5.9 % (ref 4.6–6.5)

## 2021-10-09 NOTE — Progress Notes (Incomplete)
Subjective:  Patient ID: Jennifer [name], female    DOB: 1955/08/14  Age: 66 y.o. MRN: [id number]  CC: No chief complaint on file.   HPI Jennifer [name] presents for grief, depression, sleep and grief  Outpatient Medications Prior to Visit  Medication Sig Dispense Refill   amphetamine-dextroamphetamine (ADDERALL) 5 MG tablet Take 1 tablet (5 mg total) by mouth 2 (two) times daily. Morning and lunch 60 tablet 0   Cholecalciferol (VITAMIN D-3) 5000 UNITS TABS Take 1 tablet by mouth daily.     EPINEPHrine (EPIPEN JR 2-PAK) 0.15 MG/0.3ML injection Inject 0.3 mLs (0.15 mg total) into the muscle as needed for anaphylaxis. 2 each 1   ESTRING 2 MG vaginal ring INSERT 1 RING VAGINALLY EVERY 3 MONTHS AS DIRECTED  0   fish oil-omega-3 fatty acids 1000 MG capsule Take 2 g by mouth daily.     LORazepam (ATIVAN) 1 MG tablet TAKE 0.5-1 TABLETS (0.5-1 MG TOTAL) BY MOUTH 2 (TWO) TIMES DAILY AS NEEDED FOR ANXIETY. 60 tablet 1   Multiple Vitamin (MULTIVITAMIN WITH MINERALS) TABS Take 1 tablet by mouth daily.     Nutritional Supplements (DHEA PO) Take 33 mg by mouth daily.      sertraline (ZOLOFT) 25 MG tablet Take 1 by mouth every day     thyroid (ARMOUR) 32.5 MG tablet Nature-Throid 32.5 mg tablet     vitamin C (ASCORBIC ACID) 500 MG tablet Take 1,000 mg by mouth daily.     vitamin E 1000 UNIT capsule Take by mouth.     No facility-administered medications prior to visit.    ROS: Review of Systems  Constitutional:  Positive for fatigue. Negative for activity change, appetite change, chills and unexpected weight change.  HENT:  Negative for congestion, mouth sores and sinus pressure.   Eyes:  Positive for visual disturbance.  Respiratory:  Negative for cough and chest tightness.   Gastrointestinal:  Negative for abdominal pain and nausea.  Genitourinary:  Negative for difficulty urinating, frequency and vaginal pain.  Musculoskeletal:  Positive for gait problem. Negative for back  pain.  Skin:  Negative for pallor and rash.  Neurological:  Negative for dizziness, tremors, weakness, numbness and headaches.  Psychiatric/Behavioral:  Positive for sleep disturbance. Negative for confusion, decreased concentration, dysphoric mood and suicidal ideas. The patient is nervous/anxious.    Objective:  BP 118/60 (BP Location: Left Arm, Patient Position: Sitting, Cuff Size: Large)    Pulse (!) 39    Temp 98.3 F (36.8 C) (Oral)    Ht '5\' 3"'$  (1.6 m)    Wt 182 lb (82.6 kg)    LMP 02/13/2007    SpO2 95%    BMI 32.24 kg/m   BP Readings from Last 3 Encounters:  10/09/21 118/60  01/25/21 120/60  09/27/20 118/62    Wt Readings from Last 3 Encounters:  10/09/21 182 lb (82.6 kg)  01/25/21 180 lb 3.2 oz (81.7 kg)  09/27/20 178 lb 6.4 oz (80.9 kg)    Physical Exam Constitutional:      General: She is not in acute distress.    Appearance: She is well-developed.  HENT:     Head: Normocephalic.     Right Ear: External ear normal.     Left Ear: External ear normal.     Nose: Nose normal.  Eyes:     General:        Right eye: No discharge.        Left eye: No discharge.  Conjunctiva/sclera: Conjunctivae normal.     Pupils: Pupils are equal, round, and reactive to light.  Neck:     Thyroid: No thyromegaly.     Vascular: No JVD.     Trachea: No tracheal deviation.  Cardiovascular:     Rate and Rhythm: Normal rate and regular rhythm.     Heart sounds: Normal heart sounds.  Pulmonary:     Effort: No respiratory distress.     Breath sounds: No stridor. No wheezing.  Abdominal:     General: Bowel sounds are normal. There is no distension.     Palpations: Abdomen is soft. There is no mass.     Tenderness: There is no abdominal tenderness. There is no guarding or rebound.  Musculoskeletal:        General: No tenderness.     Cervical back: Normal range of motion and neck supple. No rigidity.  Lymphadenopathy:     Cervical: No cervical adenopathy.  Skin:    Findings: No  erythema or rash.  Neurological:     Cranial Nerves: No cranial nerve deficit.     Motor: No abnormal muscle tone.     Coordination: Coordination normal.     Gait: Gait normal.     Deep Tendon Reflexes: Reflexes normal.  Psychiatric:        Behavior: Behavior normal.        Thought Content: Thought content normal.        Judgment: Judgment normal.    Lab Results  Component Value Date   WBC 5.1 11/26/2019   HGB 13.5 11/26/2019   HCT 40.5 11/26/2019   PLT 307.0 11/26/2019   GLUCOSE 95 11/19/2014   CHOL 287 (H) 01/25/2011   TRIG 70.0 01/25/2011   HDL 96.50 01/25/2011   LDLDIRECT 202.5 01/25/2011   ALT 14 11/19/2014   AST 16 11/19/2014   NA 140 11/19/2014   K 4.5 11/19/2014   CL 104 11/19/2014   CREATININE 0.81 11/19/2014   BUN 17 11/19/2014   CO2 32 11/19/2014   TSH 1.91 11/26/2019   HGBA1C 5.7 04/10/2007    US THYROID  Result Date: 06/07/2021 CLINICAL DATA:  Goiter. Dysphagia Palpable region in the left neck. EXAM: THYROID ULTRASOUND TECHNIQUE: Ultrasound examination of the thyroid gland and adjacent soft tissues was performed. COMPARISON:  08/25/2018 05/02/2017 FINDINGS: Parenchymal Echotexture: Mildly heterogeneous Isthmus: 0.3 cm Right lobe: 4.0 x 1.2 x 1.0 cm Left lobe: 3.9 x 0.9 x 1.2 cm _________________________________________________________ Estimated total number of nodules >/= 1 cm: 2 Number of spongiform nodules >/=  2 cm not described below (TR1): 0 Number of mixed cystic and solid nodules >/= 1.5 cm not described below (TR2): 0 _________________________________________________________ Nodule 1: 0.7 x 0.7 x 0.5 cm isoechoic solid nodule in the superior right thyroid lobe does not meet criteria for imaging surveillance or FNA. _________________________________________________________ Nodule # 2: Prior biopsy: No Location: Left; mid Maximum size: 1.1 cm; Other 2 dimensions: 0.9 x 0.4 cm, previously, 0.8 x 0.7 x 0.3 cm on 09/03/2011 Composition: solid/almost completely  solid (2) Echogenicity: hypoechoic (2) Shape: not taller-than-wide (0) Margins: smooth (0) Echogenic foci: none (0) ACR TI-RADS total points: 4. ACR TI-RADS risk category:  TR4 (4-6 points). Significant change in size (>/= 20% in two dimensions and minimal increase of 2 mm): No Change in features: No Change in ACR TI-RADS risk category: No ACR TI-RADS recommendations: Given stability since 2013, findings are consistent with a benign nodule. No further follow-up is required. _________________________________________________________ Nodule # 3: Prior  biopsy: No Location: Left; inferior Maximum size: 1.3 cm; Other 2 dimensions: 0.9 x 0.5 cm, previously, 1.4 x 1.3 x 0.6 cm on 01/12/2013. Composition: solid/almost completely solid (2) Echogenicity: hypoechoic (2) Shape: not taller-than-wide (0) Margins: smooth (0) Echogenic foci: none (0) ACR TI-RADS total points: 4. ACR TI-RADS risk category:  TR4 (4-6 points). Significant change in size (>/= 20% in two dimensions and minimal increase of 2 mm): No Change in features: No Change in ACR TI-RADS risk category: No ACR TI-RADS recommendations: Given stability since 01/12/2013, findings are consistent with a benign etiology. _________________________________________________________ IMPRESSION: 1. Left thyroid nodules demonstrate greater than 5 years of stability and are consistent with benign etiology. Do not require further follow-up. 2. Subcentimeter right thyroid nodule does not meet criteria for imaging follow-up. The above is in keeping with the ACR TI-RADS recommendations - J Am Coll Radiol 2017;14:587-595. Electronically Signed   By: Miachel Roux M.D.   On: 06/07/2021 16:30    Assessment & Plan:   Problem List Items Addressed This Visit     Grief     Kinleigh's husband died of throat cancer at 66 yo in 07-17-2020, her dogs died too... has 3 cats at home.  In counseling.      Hypothyroidism    On Armour      Situational depression     Jaleen's husband  died of throat cancer at 35 yo in 07-17-20, her dogs died too... has 3 cats at home.  In counseling.      Weight gain    30 lbs wt gain Discussed option: Phentermine, Saxenda and Wegovy If diabetes - Ozempic Pt joint CORE for wt loss  Wt Readings from Last 3 Encounters:  10/09/21 182 lb (82.6 kg)  01/25/21 180 lb 3.2 oz (81.7 kg)  09/27/20 178 lb 6.4 oz (80.9 kg)         Relevant Orders   TSH   T4, free   Other Visit Diagnoses     Hyperglycemia    -  Primary   Relevant Orders   Comprehensive metabolic panel   Hemoglobin A1c         No orders of the defined types were placed in this encounter.     Follow-up: No follow-ups on file.  Walker Kehr, MD

## 2021-10-09 NOTE — Assessment & Plan Note (Signed)
Jennifer [name]'s husband died of throat cancer at 66 yo in 2020-07-18, her dogs died too... has 3 cats at home.  In counseling.

## 2021-10-09 NOTE — Assessment & Plan Note (Addendum)
30 lbs wt gain ?Discussed option: Phentermine, Saxenda and Wegovy ?If diabetes - Ozempic ?Pt joint CORE for wt loss ? ?Wt Readings from Last 3 Encounters:  ?10/09/21 182 lb (82.6 kg)  ?01/25/21 180 lb 3.2 oz (81.7 kg)  ?09/27/20 178 lb 6.4 oz (80.9 kg)  ? ? ?

## 2021-10-09 NOTE — Patient Instructions (Signed)
Weight loss option: Phentermine, Saxenda and Wegovy ?If diabetes - Ozempic ?

## 2021-10-09 NOTE — Assessment & Plan Note (Signed)
Jennifer [name]'s husband died of throat cancer at 66 yo in 08/08/20, her dogs died too... has 3 cats at home. ? In counseling. ?

## 2021-10-09 NOTE — Assessment & Plan Note (Addendum)
On Armour

## 2021-10-10 LAB — COMPREHENSIVE METABOLIC PANEL
ALT: 16 U/L (ref 0–35)
AST: 16 U/L (ref 0–37)
Albumin: 4.6 g/dL (ref 3.5–5.2)
Alkaline Phosphatase: 59 U/L (ref 39–117)
BUN: 14 mg/dL (ref 6–23)
CO2: 28 mEq/L (ref 19–32)
Calcium: 9.8 mg/dL (ref 8.4–10.5)
Chloride: 104 mEq/L (ref 96–112)
Creatinine, Ser: 0.74 mg/dL (ref 0.40–1.20)
GFR: 84.92 mL/min (ref >60.00)
Glucose, Bld: 90 mg/dL (ref 70–99)
Potassium: 4.3 mEq/L (ref 3.5–5.1)
Sodium: 139 mEq/L (ref 135–145)
Total Bilirubin: 0.6 mg/dL (ref 0.2–1.2)
Total Protein: 6.9 g/dL (ref 6.0–8.3)

## 2021-10-10 LAB — T4, FREE: Free T4: 0.79 ng/dL (ref 0.60–1.60)

## 2021-10-10 LAB — TSH: TSH: 1.43 u[IU]/mL (ref 0.35–5.50)

## 2021-10-11 ENCOUNTER — Ambulatory Visit: Payer: BC Managed Care – PPO | Admitting: Internal Medicine

## 2021-10-16 ENCOUNTER — Other Ambulatory Visit: Payer: Self-pay

## 2021-10-16 ENCOUNTER — Ambulatory Visit: Payer: BC Managed Care – PPO | Admitting: Dermatology

## 2021-10-16 DIAGNOSIS — L988 Other specified disorders of the skin and subcutaneous tissue: Secondary | ICD-10-CM

## 2021-10-16 DIAGNOSIS — R202 Paresthesia of skin: Secondary | ICD-10-CM | POA: Diagnosis not present

## 2021-10-16 DIAGNOSIS — L821 Other seborrheic keratosis: Secondary | ICD-10-CM | POA: Diagnosis not present

## 2021-10-16 DIAGNOSIS — L578 Other skin changes due to chronic exposure to nonionizing radiation: Secondary | ICD-10-CM

## 2021-10-16 MED ORDER — TRETINOIN 0.025 % EX CREA: TOPICAL_CREAM | Freq: Every day | CUTANEOUS | 3 refills | Status: AC

## 2021-10-16 NOTE — Patient Instructions (Addendum)
FOR THE ARMS - OVER THE COUNTER DERMAND CREAM, CAN BE PURCHASED IN STORE OR ONLINE ? ?FOR THE ITCH - OVER THE COUNTER CERAVE ITCH CREAM, MAIN INGREDIENT IS PRAMOXINE ? ?OVER THE COUNTER HYDROCORTISONE OINTMENT - APPLY TO BODY AFTER SHOWER WHEN SKIN IS STILL DAMP  ? ?DR. Dennison Nancy [phone] - PLASTICS  ?

## 2021-10-16 NOTE — Assessment & Plan Note (Signed)
Chronic.  Continue on B12 injections ?

## 2021-10-29 ENCOUNTER — Encounter: Payer: Self-pay | Admitting: Dermatology

## 2021-10-29 NOTE — Progress Notes (Signed)
? ?  New Patient ?  ?Subjective  ?Jennifer [name] is a 66 y.o. female who presents for the following: Skin Problem (Pt states that her back has itchy spots on it and she has nobody that can see it. Pt wasnts to discuss her dry and thin skin ). ? ?Skin check, itching on back thin fragile skin. ?Location:  ?Duration:  ?Quality:  ?Associated Signs/Symptoms: ?Modifying Factors:  ?Severity:  ?Timing: ?Context:  ? ? ?The following portions of the chart were reviewed this encounter and updated as appropriate:  Tobacco  Allergies  Meds  Problems  Med Hx  Surg Hx  Fam Hx   ?  ? ?Objective  ?Well appearing patient in no apparent distress; mood and affect are within normal limits. ?Back, B/L Arms ?Textured 5 mm brown flattopped papule, typical dermoscopy ? ?Right Lower Back ?Itching without visible rash or growth compatible with neurogenic pruritus. ? ?Head - Anterior (Face) ?Subtle atrophy, telangiectasia, dyschromia secondary to chronic UV damage face and chest ? ? ? ?All sun exposed areas plus back examined. ? ? 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 ?OVER THE COUNTER PRAMOXINE CONTAINING ANTIPRURITIC GIVEN (SAMPLE).  Discussed systemic neuroactive agents but for now this will not be pursued. ? ?Seborrheic keratosis ?Back, B/L Arms ? ?Leave if stable ? ?Photoaging of skin ?Head - Anterior (Face) ? ?Discussed multiple treatment options including over-the-counter products, prescription retinoids, procedures.  We will initially use 0.5% tretinoin cream every other day and advance to daily as tolerated.  To contact me before refilling. ? ?tretinoin (RETIN-A) 0.025 % cream - Head - Anterior (Face) ?Apply topically at bedtime. ? ? ?

## 2022-03-07 ENCOUNTER — Encounter (INDEPENDENT_AMBULATORY_CARE_PROVIDER_SITE_OTHER): Payer: Self-pay

## 2022-05-25 DIAGNOSIS — R350 Frequency of micturition: Secondary | ICD-10-CM | POA: Insufficient documentation

## 2022-05-25 DIAGNOSIS — R32 Unspecified urinary incontinence: Secondary | ICD-10-CM | POA: Insufficient documentation

## 2022-06-18 ENCOUNTER — Encounter: Payer: Self-pay | Admitting: Internal Medicine

## 2022-06-18 ENCOUNTER — Ambulatory Visit: Payer: BC Managed Care – PPO | Admitting: Internal Medicine

## 2022-06-18 VITALS — BP 120/76 | HR 70 | Temp 97.8°F | Ht 63.0 in | Wt 185.0 lb

## 2022-06-18 DIAGNOSIS — E538 Deficiency of other specified B group vitamins: Secondary | ICD-10-CM | POA: Diagnosis not present

## 2022-06-18 DIAGNOSIS — F900 Attention-deficit hyperactivity disorder, predominantly inattentive type: Secondary | ICD-10-CM | POA: Diagnosis not present

## 2022-06-18 DIAGNOSIS — N952 Postmenopausal atrophic vaginitis: Secondary | ICD-10-CM | POA: Insufficient documentation

## 2022-06-18 DIAGNOSIS — M8580 Other specified disorders of bone density and structure, unspecified site: Secondary | ICD-10-CM | POA: Insufficient documentation

## 2022-06-18 DIAGNOSIS — N905 Atrophy of vulva: Secondary | ICD-10-CM | POA: Insufficient documentation

## 2022-06-18 DIAGNOSIS — R9230 Dense breasts, unspecified: Secondary | ICD-10-CM | POA: Insufficient documentation

## 2022-06-18 DIAGNOSIS — F4321 Adjustment disorder with depressed mood: Secondary | ICD-10-CM | POA: Diagnosis not present

## 2022-06-18 MED ORDER — ESCITALOPRAM OXALATE 5 MG PO TABS: 5.0000 mg | ORAL_TABLET | Freq: Every day | ORAL | 5 refills | Status: DC

## 2022-06-18 MED ORDER — ADDERALL 10 MG PO TABS: 5.0000 mg | ORAL_TABLET | Freq: Every day | ORAL | 0 refills | Status: DC

## 2022-06-18 NOTE — Assessment & Plan Note (Signed)
Coping OK

## 2022-06-18 NOTE — Assessment & Plan Note (Signed)
On B12 

## 2022-06-18 NOTE — Progress Notes (Signed)
Subjective:  Patient ID: Jennifer [name], female    DOB: 1955-11-28  Age: 66 y.o. MRN: [id number]  CC: Medication Refill   HPI Jennifer [name] presents for sleep disorder, ADD C/o depression  Outpatient Medications Prior to Visit  Medication Sig Dispense Refill   ascorbic acid (VITAMIN C) 1000 MG tablet Take by mouth.     chlorhexidine (PERIDEX) 0.12 % solution Use as directed 5 mLs in the mouth or throat 2 (two) times daily.     Cholecalciferol (VITAMIN D-3) 5000 UNITS TABS Take 1 tablet by mouth daily.     clindamycin (CLEOCIN) 150 MG capsule Take 150 mg by mouth 4 (four) times daily.     clotrimazole (LOTRIMIN) 1 % external solution clotrimazole 1 % topical solution  APPLY 1 ML ON THE SKIN TWICE A DAY     diclofenac Sodium (VOLTAREN) 1 % GEL Apply 4 g topically 4 (four) times daily.     EPINEPHrine (EPIPEN JR 2-PAK) 0.15 MG/0.3ML injection Inject 0.3 mLs (0.15 mg total) into the muscle as needed for anaphylaxis. 2 each 1   ESTRING 2 MG vaginal ring INSERT 1 RING VAGINALLY EVERY 3 MONTHS AS DIRECTED  0   fish oil-omega-3 fatty acids 1000 MG capsule Take 2 g by mouth daily.     ibuprofen (ADVIL) 600 MG tablet Take 600 mg by mouth 4 (four) times daily.     ketoconazole (NIZORAL) 2 % cream Apply 1 Application topically daily.     LORazepam (ATIVAN) 1 MG tablet TAKE 0.5-1 TABLETS (0.5-1 MG TOTAL) BY MOUTH 2 (TWO) TIMES DAILY AS NEEDED FOR ANXIETY. 60 tablet 1   mirabegron ER (MYRBETRIQ) 50 MG TB24 tablet Take 1 tablet by mouth daily.     Multiple Vitamin (MULTIVITAMIN WITH MINERALS) TABS Take 1 tablet by mouth daily.     Multiple Vitamin (MULTIVITAMIN) capsule Take 1 capsule by mouth daily.     Na Sulfate-K Sulfate-Mg Sulf (SUPREP BOWEL PREP KIT) 17.5-3.13-1.6 GM/177ML SOLN See admin instructions.     Nutritional Supplements (DHEA PO) Take 33 mg by mouth daily.      ondansetron (ZOFRAN-ODT) 4 MG disintegrating tablet Take 4 mg by mouth once.     propranolol (INDERAL) 10 MG tablet  Take 10 mg by mouth 2 (two) times daily.     sertraline (ZOLOFT) 25 MG tablet Take 1 by mouth every day     sodium fluoride (DENTA 5000 PLUS) 1.1 % CREA dental cream Place 1 Application onto teeth at bedtime.     thyroid (ARMOUR) 32.5 MG tablet Nature-Throid 32.5 mg tablet     tretinoin (RETIN-A) 0.025 % cream Apply topically at bedtime. 45 g 3   triamcinolone cream (KENALOG) 0.1 % Apply 1 Application topically 2 (two) times daily.     vitamin C (ASCORBIC ACID) 500 MG tablet Take 1,000 mg by mouth daily.     vitamin E 1000 UNIT capsule Take by mouth.     ALPRAZolam (XANAX) 1 MG tablet 1 tab(s) orally to take 1 the night before procedure and morening of procedure for 1 days     amoxicillin (AMOXIL) 875 MG tablet Take 875 mg by mouth 2 (two) times daily.     amoxicillin-clavulanate (AUGMENTIN) 875-125 MG tablet Take 1 tablet by mouth 2 (two) times daily.     amphetamine-dextroamphetamine (ADDERALL) 5 MG tablet Take 1 tablet (5 mg total) by mouth 2 (two) times daily. Morning and lunch 60 tablet 0   azithromycin (ZITHROMAX) 250 MG tablet Take 250 mg by  mouth once.     diazepam (VALIUM) 2 MG tablet Take 2 mg by mouth every 6 (six) hours as needed.     hydrOXYzine (ATARAX) 25 MG tablet Take 25 mg by mouth 3 (three) times daily.     traZODone (DESYREL) 50 MG tablet Take 50 mg by mouth at bedtime.     venlafaxine XR (EFFEXOR-XR) 37.5 MG 24 hr capsule Take 1 tablet by mouth daily.     No facility-administered medications prior to visit.    ROS: Review of Systems  Constitutional:  Negative for activity change, appetite change, chills, fatigue and unexpected weight change.  HENT:  Negative for congestion, mouth sores and sinus pressure.   Eyes:  Negative for visual disturbance.  Respiratory:  Negative for cough and chest tightness.   Gastrointestinal:  Negative for abdominal pain and nausea.  Genitourinary:  Negative for difficulty urinating, frequency and vaginal pain.  Musculoskeletal:  Negative  for back pain and gait problem.  Skin:  Negative for pallor and rash.  Neurological:  Negative for dizziness, tremors, weakness, numbness and headaches.  Psychiatric/Behavioral:  Positive for decreased concentration, dysphoric mood and sleep disturbance. Negative for confusion and suicidal ideas. The patient is nervous/anxious.     Objective:  BP 120/76 (BP Location: Left Arm, Patient Position: Sitting, Cuff Size: Normal)   Pulse 70   Temp 97.8 F (36.6 C) (Oral)   Ht _0  (1.6 m)   Wt 185 lb (83.9 kg)   LMP 02/13/2007   SpO2 98%   BMI 32.77 kg/m   BP Readings from Last 3 Encounters:  06/18/22 120/76  10/09/21 118/60  01/25/21 120/60    Wt Readings from Last 3 Encounters:  06/18/22 185 lb (83.9 kg)  10/09/21 182 lb (82.6 kg)  01/25/21 180 lb 3.2 oz (81.7 kg)    Physical Exam Constitutional:      General: She is not in acute distress.    Appearance: She is well-developed. She is obese.  HENT:     Head: Normocephalic.     Right Ear: External ear normal.     Left Ear: External ear normal.     Nose: Nose normal.  Eyes:     General:        Right eye: No discharge.        Left eye: No discharge.     Conjunctiva/sclera: Conjunctivae normal.     Pupils: Pupils are equal, round, and reactive to light.  Neck:     Thyroid: No thyromegaly.     Vascular: No JVD.     Trachea: No tracheal deviation.  Cardiovascular:     Rate and Rhythm: Normal rate and regular rhythm.     Heart sounds: Normal heart sounds.  Pulmonary:     Effort: No respiratory distress.     Breath sounds: No stridor. No wheezing.  Abdominal:     General: Bowel sounds are normal. There is no distension.     Palpations: Abdomen is soft. There is no mass.     Tenderness: There is no abdominal tenderness. There is no guarding or rebound.  Musculoskeletal:        General: No tenderness.     Cervical back: Normal range of motion and neck supple. No rigidity.  Lymphadenopathy:     Cervical: No cervical  adenopathy.  Skin:    Findings: No erythema or rash.  Neurological:     Mental Status: She is oriented to person, place, and time.     Cranial Nerves:  No cranial nerve deficit.     Motor: No abnormal muscle tone.     Coordination: Coordination normal.     Deep Tendon Reflexes: Reflexes normal.  Psychiatric:        Behavior: Behavior normal.        Thought Content: Thought content normal.        Judgment: Judgment normal.     Lab Results  Component Value Date   WBC 5.1 11/26/2019   HGB 13.5 11/26/2019   HCT 40.5 11/26/2019   PLT 307.0 11/26/2019   GLUCOSE 90 10/09/2021   CHOL 287 (H) 01/25/2011   TRIG 70.0 01/25/2011   HDL 96.50 01/25/2011   LDLDIRECT 202.5 01/25/2011   ALT 16 10/09/2021   AST 16 10/09/2021   NA 139 10/09/2021   K 4.3 10/09/2021   CL 104 10/09/2021   CREATININE 0.74 10/09/2021   BUN 14 10/09/2021   CO2 28 10/09/2021   TSH 1.43 10/09/2021   HGBA1C 5.9 10/09/2021    US THYROID  Result Date: 06/07/2021 CLINICAL DATA:  Goiter. Dysphagia Palpable region in the left neck. EXAM: THYROID ULTRASOUND TECHNIQUE: Ultrasound examination of the thyroid gland and adjacent soft tissues was performed. COMPARISON:  08/25/2018 05/02/2017 FINDINGS: Parenchymal Echotexture: Mildly heterogeneous Isthmus: 0.3 cm Right lobe: 4.0 x 1.2 x 1.0 cm Left lobe: 3.9 x 0.9 x 1.2 cm _________________________________________________________ Estimated total number of nodules >/= 1 cm: 2 Number of spongiform nodules >/=  2 cm not described below (TR1): 0 Number of mixed cystic and solid nodules >/= 1.5 cm not described below (TR2): 0 _________________________________________________________ Nodule 1: 0.7 x 0.7 x 0.5 cm isoechoic solid nodule in the superior right thyroid lobe does not meet criteria for imaging surveillance or FNA. _________________________________________________________ Nodule # 2: Prior biopsy: No Location: Left; mid Maximum size: 1.1 cm; Other 2 dimensions: 0.9 x 0.4 cm,  previously, 0.8 x 0.7 x 0.3 cm on 09/03/2011 Composition: solid/almost completely solid (2) Echogenicity: hypoechoic (2) Shape: not taller-than-wide (0) Margins: smooth (0) Echogenic foci: none (0) ACR TI-RADS total points: 4. ACR TI-RADS risk category:  TR4 (4-6 points). Significant change in size (>/= 20% in two dimensions and minimal increase of 2 mm): No Change in features: No Change in ACR TI-RADS risk category: No ACR TI-RADS recommendations: Given stability since 2013, findings are consistent with a benign nodule. No further follow-up is required. _________________________________________________________ Nodule # 3: Prior biopsy: No Location: Left; inferior Maximum size: 1.3 cm; Other 2 dimensions: 0.9 x 0.5 cm, previously, 1.4 x 1.3 x 0.6 cm on 01/12/2013. Composition: solid/almost completely solid (2) Echogenicity: hypoechoic (2) Shape: not taller-than-wide (0) Margins: smooth (0) Echogenic foci: none (0) ACR TI-RADS total points: 4. ACR TI-RADS risk category:  TR4 (4-6 points). Significant change in size (>/= 20% in two dimensions and minimal increase of 2 mm): No Change in features: No Change in ACR TI-RADS risk category: No ACR TI-RADS recommendations: Given stability since 01/12/2013, findings are consistent with a benign etiology. _________________________________________________________ IMPRESSION: 1. Left thyroid nodules demonstrate greater than 5 years of stability and are consistent with benign etiology. Do not require further follow-up. 2. Subcentimeter right thyroid nodule does not meet criteria for imaging follow-up. The above is in keeping with the ACR TI-RADS recommendations - J Am Coll Radiol 2017;14:587-595. Electronically Signed   By: Miachel Roux M.D.   On: 06/07/2021 16:30    Assessment & Plan:   Problem List Items Addressed This Visit     Situational depression    Coping  OK      Relevant Medications   escitalopram (LEXAPRO) 5 MG tablet   Grief    Tatanisha's husband died of  throat cancer in 08-07-20  Donasia       B12 deficiency    On B12      ADD (attention deficit disorder) - Primary    Chronic Low dose adderall - 10 mg. Pt wants to switch to a brand name  Potential benefits of a long term adderall use as well as potential risks  and complications were explained to the patient and were aknowledged.         Meds ordered this encounter  Medications   ADDERALL 10 MG tablet    Sig: Take 0.5-1 tablets (5-10 mg total) by mouth daily with breakfast.    Dispense:  30 tablet    Refill:  0    Brand name please   escitalopram (LEXAPRO) 5 MG tablet    Sig: Take 1 tablet (5 mg total) by mouth daily.    Dispense:  30 tablet    Refill:  5      Follow-up: Return in about 2 months (around 08/18/2022) for a follow-up visit.  Walker Kehr, MD

## 2022-06-18 NOTE — Assessment & Plan Note (Signed)
Charlisa's husband died of throat cancer in 2020/07/13  Fernando

## 2022-06-18 NOTE — Assessment & Plan Note (Addendum)
Chronic Low dose adderall - 10 mg. Pt wants to switch to a brand name  Potential benefits of a long term adderall use as well as potential risks  and complications were explained to the patient and were aknowledged.

## 2022-06-26 ENCOUNTER — Other Ambulatory Visit: Payer: Self-pay | Admitting: Family Medicine

## 2022-06-26 DIAGNOSIS — E049 Nontoxic goiter, unspecified: Secondary | ICD-10-CM

## 2022-07-06 ENCOUNTER — Other Ambulatory Visit: Payer: BC Managed Care – PPO

## 2022-07-12 ENCOUNTER — Other Ambulatory Visit: Payer: Self-pay | Admitting: Internal Medicine

## 2022-07-16 ENCOUNTER — Ambulatory Visit
Admission: RE | Admit: 2022-07-16 | Discharge: 2022-07-16 | Disposition: A | Discharge status: Home / Self Care | Payer: BC Managed Care – PPO | Source: Ambulatory Visit | Attending: Family Medicine | Admitting: Family Medicine

## 2022-07-16 DIAGNOSIS — E049 Nontoxic goiter, unspecified: Secondary | ICD-10-CM

## 2022-10-21 DIAGNOSIS — Z960 Presence of urogenital implants: Secondary | ICD-10-CM | POA: Insufficient documentation

## 2022-10-21 DIAGNOSIS — N3941 Urge incontinence: Secondary | ICD-10-CM | POA: Insufficient documentation

## 2022-10-24 ENCOUNTER — Other Ambulatory Visit: Payer: Self-pay | Admitting: Obstetrics and Gynecology

## 2022-10-24 DIAGNOSIS — M810 Age-related osteoporosis without current pathological fracture: Secondary | ICD-10-CM

## 2022-11-05 ENCOUNTER — Ambulatory Visit: Payer: BC Managed Care – PPO | Admitting: Internal Medicine

## 2022-11-21 ENCOUNTER — Ambulatory Visit (INDEPENDENT_AMBULATORY_CARE_PROVIDER_SITE_OTHER): Payer: BC Managed Care – PPO | Admitting: Internal Medicine

## 2022-11-21 VITALS — BP 118/80 | HR 67 | Temp 98.2°F | Ht 63.0 in | Wt 180.0 lb

## 2022-11-21 DIAGNOSIS — Z6831 Body mass index (BMI) 31.0-31.9, adult: Secondary | ICD-10-CM

## 2022-11-21 DIAGNOSIS — E034 Atrophy of thyroid (acquired): Secondary | ICD-10-CM

## 2022-11-21 DIAGNOSIS — E6609 Other obesity due to excess calories: Secondary | ICD-10-CM

## 2022-11-21 DIAGNOSIS — E559 Vitamin D deficiency, unspecified: Secondary | ICD-10-CM

## 2022-11-21 DIAGNOSIS — F900 Attention-deficit hyperactivity disorder, predominantly inattentive type: Secondary | ICD-10-CM

## 2022-11-21 MED ORDER — WEGOVY 0.25 MG/0.5ML ~~LOC~~ SOAJ: 0.2500 mg | SUBCUTANEOUS | 0 refills | Status: AC

## 2022-11-21 MED ORDER — ADDERALL 10 MG PO TABS: 5.0000 mg | ORAL_TABLET | Freq: Every day | ORAL | 0 refills | Status: AC

## 2022-11-21 MED ORDER — WEGOVY 0.5 MG/0.5ML ~~LOC~~ SOAJ: 0.5000 mg | SUBCUTANEOUS | 3 refills | Status: AC

## 2022-11-21 MED ORDER — LORAZEPAM 1 MG PO TABS: 0.5000 mg | ORAL_TABLET | Freq: Two times a day (BID) | ORAL | 1 refills | Status: AC | PRN

## 2022-11-21 MED ORDER — TRIAMCINOLONE ACETONIDE 0.1 % EX CREA: 1.0000 | TOPICAL_CREAM | Freq: Two times a day (BID) | CUTANEOUS | 3 refills | Status: AC

## 2022-11-21 NOTE — Progress Notes (Signed)
Subjective:  Patient ID: Jennifer [name], female    DOB: 1955-11-30  Age: 67 y.o. MRN: [id number]  CC: No chief complaint on file.   HPI Jennifer [name] presents for ADD C/o wt gain/obesity  Outpatient Medications Prior to Visit  Medication Sig Dispense Refill   ascorbic acid (VITAMIN C) 1000 MG tablet Take by mouth.     Cholecalciferol (VITAMIN D-3) 5000 UNITS TABS Take 1 tablet by mouth daily.     EPINEPHrine (EPIPEN JR 2-PAK) 0.15 MG/0.3ML injection Inject 0.3 mLs (0.15 mg total) into the muscle as needed for anaphylaxis. 2 each 1   ESTRING 2 MG vaginal ring INSERT 1 RING VAGINALLY EVERY 3 MONTHS AS DIRECTED  0   fish oil-omega-3 fatty acids 1000 MG capsule Take 2 g by mouth daily.     Multiple Vitamin (MULTIVITAMIN WITH MINERALS) TABS Take 1 tablet by mouth daily.     Nutritional Supplements (DHEA PO) Take 33 mg by mouth daily.      thyroid (ARMOUR) 32.5 MG tablet Nature-Throid 32.5 mg tablet     vitamin C (ASCORBIC ACID) 500 MG tablet Take 1,000 mg by mouth daily.     vitamin E 1000 UNIT capsule Take by mouth.     ADDERALL 10 MG tablet Take 0.5-1 tablets (5-10 mg total) by mouth daily with breakfast. 30 tablet 0   LORazepam (ATIVAN) 1 MG tablet TAKE 0.5-1 TABLETS (0.5-1 MG TOTAL) BY MOUTH 2 (TWO) TIMES DAILY AS NEEDED FOR ANXIETY. 60 tablet 1   triamcinolone cream (KENALOG) 0.1 % Apply 1 Application topically 2 (two) times daily.     clotrimazole (LOTRIMIN) 1 % external solution clotrimazole 1 % topical solution  APPLY 1 ML ON THE SKIN TWICE A DAY (Patient not taking: Reported on 11/21/2022)     diclofenac Sodium (VOLTAREN) 1 % GEL Apply 4 g topically 4 (four) times daily. (Patient not taking: Reported on 11/21/2022)     escitalopram (LEXAPRO) 5 MG tablet TAKE 1 TABLET (5 MG TOTAL) BY MOUTH DAILY. (Patient not taking: Reported on 11/21/2022) 90 tablet 1   ibuprofen (ADVIL) 600 MG tablet Take 600 mg by mouth 4 (four) times daily. (Patient not taking: Reported on 11/21/2022)      mirabegron ER (MYRBETRIQ) 50 MG TB24 tablet Take 1 tablet by mouth daily. (Patient not taking: Reported on 11/21/2022)     ondansetron (ZOFRAN-ODT) 4 MG disintegrating tablet Take 4 mg by mouth once. (Patient not taking: Reported on 11/21/2022)     propranolol (INDERAL) 10 MG tablet Take 10 mg by mouth 2 (two) times daily. (Patient not taking: Reported on 11/21/2022)     chlorhexidine (PERIDEX) 0.12 % solution Use as directed 5 mLs in the mouth or throat 2 (two) times daily. (Patient not taking: Reported on 11/21/2022)     clindamycin (CLEOCIN) 150 MG capsule Take 150 mg by mouth 4 (four) times daily. (Patient not taking: Reported on 11/21/2022)     ketoconazole (NIZORAL) 2 % cream Apply 1 Application topically daily. (Patient not taking: Reported on 11/21/2022)     Multiple Vitamin (MULTIVITAMIN) capsule Take 1 capsule by mouth daily.     Na Sulfate-K Sulfate-Mg Sulf (SUPREP BOWEL PREP KIT) 17.5-3.13-1.6 GM/177ML SOLN See admin instructions.     sertraline (ZOLOFT) 25 MG tablet Take 1 by mouth every day (Patient not taking: Reported on 11/21/2022)     sodium fluoride (DENTA 5000 PLUS) 1.1 % CREA dental cream Place 1 Application onto teeth at bedtime. (Patient not taking: Reported on 11/21/2022)  No facility-administered medications prior to visit.    ROS: Review of Systems  Constitutional:  Positive for fatigue and unexpected weight change. Negative for activity change, appetite change and chills.  HENT:  Negative for congestion, mouth sores and sinus pressure.   Eyes:  Positive for visual disturbance.  Respiratory:  Negative for cough and chest tightness.   Gastrointestinal:  Negative for abdominal pain and nausea.  Genitourinary:  Negative for difficulty urinating, frequency and vaginal pain.  Musculoskeletal:  Negative for back pain and gait problem.  Skin:  Negative for pallor and rash.  Neurological:  Negative for dizziness, tremors, weakness, numbness and headaches.   Psychiatric/Behavioral:  Positive for decreased concentration and sleep disturbance. Negative for confusion and suicidal ideas. The patient is nervous/anxious.     Objective:  BP 118/80 (BP Location: Left Arm, Patient Position: Sitting, Cuff Size: Normal)   Pulse 67   Temp 98.2 F (36.8 C) (Oral)   Ht 5\' 3"  (1.6 m)   Wt 180 lb (81.6 kg)   LMP 02/13/2007   SpO2 94%   BMI 31.89 kg/m   BP Readings from Last 3 Encounters:  11/21/22 118/80  06/18/22 120/76  10/09/21 118/60    Wt Readings from Last 3 Encounters:  11/21/22 180 lb (81.6 kg)  06/18/22 185 lb (83.9 kg)  10/09/21 182 lb (82.6 kg)    Physical Exam Constitutional:      General: She is not in acute distress.    Appearance: She is well-developed. She is obese.  HENT:     Head: Normocephalic.     Right Ear: External ear normal.     Left Ear: External ear normal.     Nose: Nose normal.  Eyes:     General:        Right eye: No discharge.        Left eye: No discharge.     Conjunctiva/sclera: Conjunctivae normal.     Pupils: Pupils are equal, round, and reactive to light.  Neck:     Thyroid: No thyromegaly.     Vascular: No JVD.     Trachea: No tracheal deviation.  Cardiovascular:     Rate and Rhythm: Normal rate and regular rhythm.     Heart sounds: Normal heart sounds.  Pulmonary:     Effort: No respiratory distress.     Breath sounds: No stridor. No wheezing.  Abdominal:     General: Bowel sounds are normal. There is no distension.     Palpations: Abdomen is soft. There is no mass.     Tenderness: There is no abdominal tenderness. There is no guarding or rebound.  Musculoskeletal:        General: No tenderness.     Cervical back: Normal range of motion and neck supple. No rigidity.  Lymphadenopathy:     Cervical: No cervical adenopathy.  Skin:    Findings: No erythema or rash.  Neurological:     Cranial Nerves: No cranial nerve deficit.     Motor: No abnormal muscle tone.     Coordination:  Coordination normal.     Deep Tendon Reflexes: Reflexes normal.  Psychiatric:        Behavior: Behavior normal.        Thought Content: Thought content normal.        Judgment: Judgment normal.     Lab Results  Component Value Date   WBC 5.1 11/26/2019   HGB 13.5 11/26/2019   HCT 40.5 11/26/2019   PLT 307.0 11/26/2019  GLUCOSE 90 10/09/2021   CHOL 287 (H) 01/25/2011   TRIG 70.0 01/25/2011   HDL 96.50 01/25/2011   LDLDIRECT 202.5 01/25/2011   ALT 16 10/09/2021   AST 16 10/09/2021   NA 139 10/09/2021   K 4.3 10/09/2021   CL 104 10/09/2021   CREATININE 0.74 10/09/2021   BUN 14 10/09/2021   CO2 28 10/09/2021   TSH 1.43 10/09/2021   HGBA1C 5.9 10/09/2021    US THYROID  Result Date: 07/17/2022 CLINICAL DATA:  Goiter. EXAM: THYROID ULTRASOUND TECHNIQUE: Ultrasound examination of the thyroid gland and adjacent soft tissues was performed. COMPARISON:  Multiple prior follow-up ultrasounds including 06/07/2021 and 05/02/2017 FINDINGS: Parenchymal Echotexture: Mildly heterogenous Isthmus: 0.2 cm Right lobe: 4.2 x 1.2 x 1.0 cm Left lobe: 4.0 x 1.0 x 1.3 cm _________________________________________________________ Estimated total number of nodules >/= 1 cm: 2 Number of spongiform nodules >/=  2 cm not described below (TR1): 0 Number of mixed cystic and solid nodules >/= 1.5 cm not described below (TR2): 0 _________________________________________________________ Heterogeneous thyroid gland. Small nodules are present on the right and throughout the left gland. The left-sided thyroid nodules demonstrate continued stability for more than 5 years. There are no new nodules or suspicious features. None of the nodules meet criteria to warrant further evaluation. IMPRESSION: Continued stability of left-sided thyroid nodules for more than 5 years consistent with benignity. No new nodules or suspicious features. The above is in keeping with the ACR TI-RADS recommendations - J Am Coll Radiol  2017;14:587-595. Electronically Signed   By: Malachy Moan M.D.   On: 07/17/2022 15:17    Assessment & Plan:   Problem List Items Addressed This Visit     Hypothyroidism    Chronic.  On Armour Will obtain lab work including thyroid test      Vitamin D deficiency    Continue on vitamin D      OBESITY - Primary    Discussed.  [facility] prescription provided.      Relevant Medications   Semaglutide-Weight Management (WEGOVY) 0.25 MG/0.5ML SOAJ   Semaglutide-Weight Management (WEGOVY) 0.5 MG/0.5ML SOAJ   ADDERALL 10 MG tablet   ADD (attention deficit disorder)    Chronic Low dose adderall - 10 mg. Pt wants to switch to a brand name  Potential benefits of a long term adderall use as well as potential risks  and complications were explained to the patient and were aknowledged.         Meds ordered this encounter  Medications   Semaglutide-Weight Management (WEGOVY) 0.25 MG/0.5ML SOAJ    Sig: Inject 0.25 mg into the skin once a week.    Dispense:  2 mL    Refill:  0    BMI 32.7. Dyslipidemia, osteoarthritis   Semaglutide-Weight Management (WEGOVY) 0.5 MG/0.5ML SOAJ    Sig: Inject 0.5 mg into the skin once a week.    Dispense:  2 mL    Refill:  3    BMI 32.7. Dyslipidemia, osteoarthritis   ADDERALL 10 MG tablet    Sig: Take 0.5-1 tablets (5-10 mg total) by mouth daily with breakfast.    Dispense:  30 tablet    Refill:  0    Brand name please   LORazepam (ATIVAN) 1 MG tablet    Sig: Take 0.5-1 tablets (0.5-1 mg total) by mouth 2 (two) times daily as needed for anxiety.    Dispense:  60 tablet    Refill:  1    Not to exceed 4 additional fills  before 12/16/2020.   triamcinolone cream (KENALOG) 0.1 %    Sig: Apply 1 Application topically 2 (two) times daily.    Dispense:  60 g    Refill:  3      Follow-up: Return in about 3 months (around 02/20/2023) for a follow-up visit.  Sonda Primes, MD

## 2022-12-01 ENCOUNTER — Encounter: Payer: Self-pay | Admitting: Internal Medicine

## 2022-12-01 NOTE — Assessment & Plan Note (Signed)
Chronic.  On Armour Will obtain lab work including thyroid test

## 2022-12-01 NOTE — Assessment & Plan Note (Signed)
Discussed.  [facility] prescription provided.

## 2022-12-01 NOTE — Assessment & Plan Note (Signed)
Continue on vitamin D 

## 2022-12-01 NOTE — Assessment & Plan Note (Signed)
Chronic Low dose adderall - 10 mg. Pt wants to switch to a brand name  Potential benefits of a long term adderall use as well as potential risks  and complications were explained to the patient and were aknowledged. 

## 2023-01-10 ENCOUNTER — Telehealth: Payer: Self-pay

## 2023-01-10 NOTE — Telephone Encounter (Signed)
Patient arrive in office insisting on an appointment today with a provider for palpitations, left arm pain, and pain in her side. Informed patient there was not appointment at this time and she could be seen virtually or come in for OV tomorrow, 6/14. She stated "there is nothing you can do for me virtually" and I will be seen today, I am not waiting until tomorrow".   I went to assess patient and make her aware best course is to go to the emergency room, or at least urgent care. Patient refused to either.

## 2023-01-17 ENCOUNTER — Telehealth: Payer: Self-pay

## 2023-01-17 NOTE — Transitions of Care (Post Inpatient/ED Visit) (Signed)
   01/17/2023  Name: Jennifer [name] MRN: [id number] DOB: [birth year]  Today's TOC FU Call Status: Today's TOC FU Call Status:: Unsuccessful Call (2nd Attempt) Unsuccessful Call (2nd Attempt) Date: 01/17/23  Attempted to reach the patient regarding the most recent Inpatient/ED visit.  Follow Up Plan: Additional outreach attempts will be made to reach the patient to complete the Transitions of Care (Post Inpatient/ED visit) call.   Signature TB,CMA

## 2023-01-17 NOTE — Transitions of Care (Post Inpatient/ED Visit) (Signed)
   01/17/2023  Name: Jennifer [name] MRN: [id number] DOB: 1956/05/05  Today's TOC FU Call Status: Today's TOC FU Call Status:: Unsuccessul Call (1st Attempt) Unsuccessful Call (1st Attempt) Date: 01/17/23  Attempted to reach the patient regarding the most recent Inpatient/ED visit.  Follow Up Plan: Additional outreach attempts will be made to reach the patient to complete the Transitions of Care (Post Inpatient/ED visit) call.   Signature Karena Addison, LPN [facility] Nurse Health Advisor Direct Dial [phone]

## 2023-01-21 NOTE — Transitions of Care (Post Inpatient/ED Visit) (Unsigned)
   01/21/2023  Name: Jennifer [name] MRN: [id number] DOB: 1956/04/06  Today's TOC FU Call Status: Today's TOC FU Call Status:: Unsuccessful Call (2nd Attempt) Unsuccessful Call (1st Attempt) Date: 01/17/23 Unsuccessful Call (2nd Attempt) Date: 01/21/23  Attempted to reach the patient regarding the most recent Inpatient/ED visit.  Follow Up Plan: Additional outreach attempts will be made to reach the patient to complete the Transitions of Care (Post Inpatient/ED visit) call.   Signature Karena Addison, LPN [facility] Nurse Health Advisor Direct Dial [phone]

## 2023-01-22 ENCOUNTER — Encounter: Payer: Self-pay | Admitting: Internal Medicine

## 2023-01-22 NOTE — Transitions of Care (Post Inpatient/ED Visit) (Signed)
   01/22/2023  Name: Jennifer [name] MRN: [id number] DOB: 11/12/1955 Patient has been dismissed from practice Today's TOC FU Call Status: Today's TOC FU Call Status:: Unsuccessful Call (2nd Attempt) Unsuccessful Call (1st Attempt) Date: 01/17/23 Unsuccessful Call (2nd Attempt) Date: 01/21/23  Attempted to reach the patient regarding the most recent Inpatient/ED visit.  Follow Up Plan: No further outreach attempts will be made at this time. We have been unable to contact the patient.  Signature Karena Addison, LPN [facility] Nurse Health Advisor Direct Dial [phone]

## 2023-01-23 NOTE — Telephone Encounter (Signed)
Hi Kim, Pls see the d/c letter. Thanks,

## 2023-02-07 ENCOUNTER — Encounter: Payer: Self-pay | Admitting: Family Medicine

## 2023-02-07 ENCOUNTER — Ambulatory Visit: Payer: BC Managed Care – PPO | Admitting: Nurse Practitioner

## 2023-02-20 ENCOUNTER — Ambulatory Visit: Payer: BC Managed Care – PPO | Admitting: Internal Medicine

## 2023-05-03 ENCOUNTER — Other Ambulatory Visit: Payer: Self-pay | Admitting: Internal Medicine

## 2023-05-03 DIAGNOSIS — Z1211 Encounter for screening for malignant neoplasm of colon: Secondary | ICD-10-CM

## 2023-05-03 DIAGNOSIS — Z1212 Encounter for screening for malignant neoplasm of rectum: Secondary | ICD-10-CM

## 2023-07-19 ENCOUNTER — Other Ambulatory Visit (HOSPITAL_BASED_OUTPATIENT_CLINIC_OR_DEPARTMENT_OTHER): Payer: Self-pay | Admitting: Obstetrics & Gynecology

## 2023-07-19 ENCOUNTER — Telehealth (HOSPITAL_BASED_OUTPATIENT_CLINIC_OR_DEPARTMENT_OTHER): Payer: Self-pay | Admitting: Obstetrics & Gynecology

## 2023-07-19 MED ORDER — ESTRING 2 MG VA RING: 1.0000 | VAGINAL_RING | VAGINAL | 0 refills | Status: AC

## 2023-07-19 NOTE — Telephone Encounter (Signed)
Pt called requesting RX for Estring to be called into her pharmacy.  Has appt on Monday and has not received in the mail yet.  Has reached out to this pharmacy and they are on backorder.  Hoping [facility] will have it.  Will see Dr. Estanislado Pandy for appt.  Advised would call in and if she isn't able to get this to call the office first thing Monday AM.

## 2023-07-28 IMAGING — US US THYROID
1 series · 13 of 25 positions shown · non-contrast
Comparison: 08/25/2018

05/02/2017

CLINICAL DATA: Goiter.

Dysphagia
Palpable region in the left neck.
EXAM:
THYROID ULTRASOUND
TECHNIQUE: Ultrasound examination of the thyroid gland and adjacent soft
tissues was performed.

[Series 1: us thyroid · 0.04mm/px · 13 of 41 slices shown]
[im 1/41]
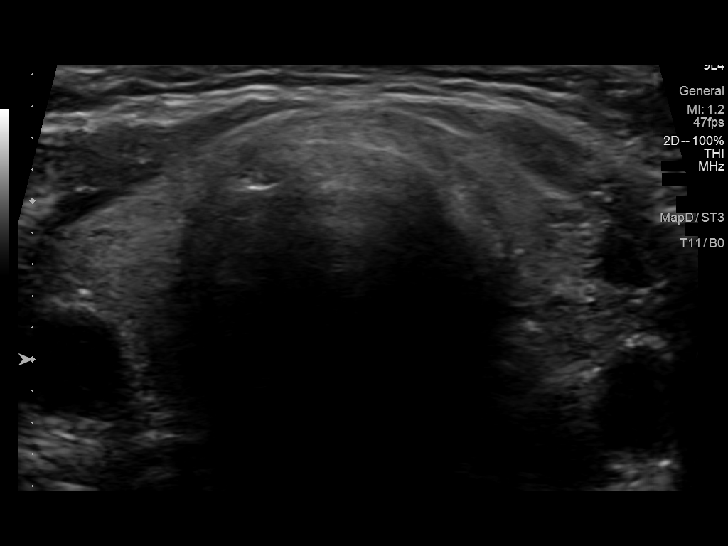
[im 4/41]
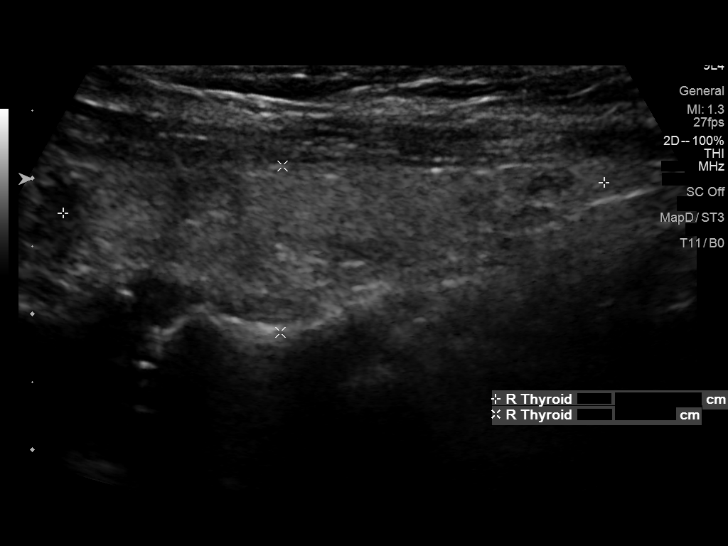
[im 7/41]
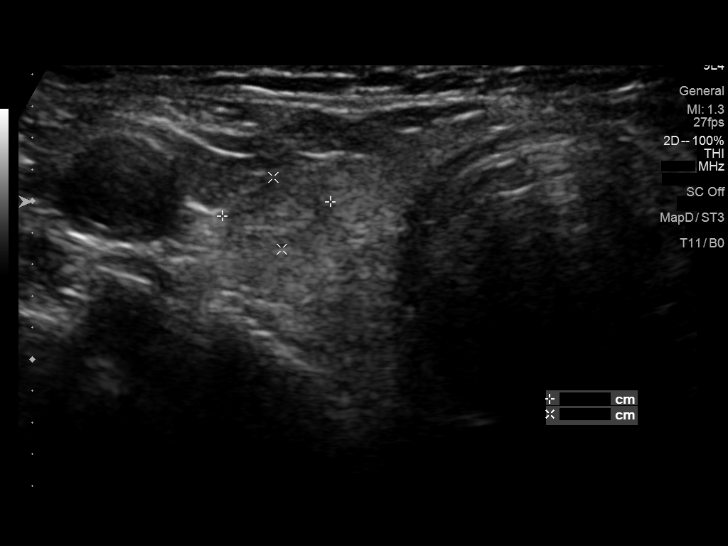
[im 11/41]
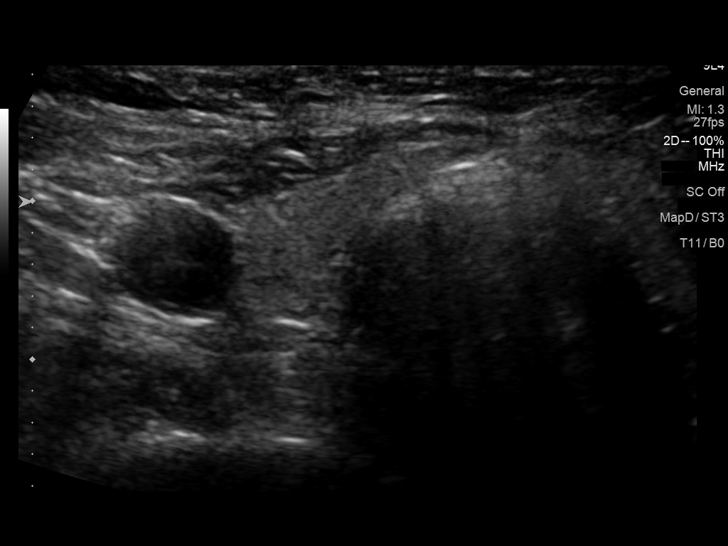
[im 14/41]
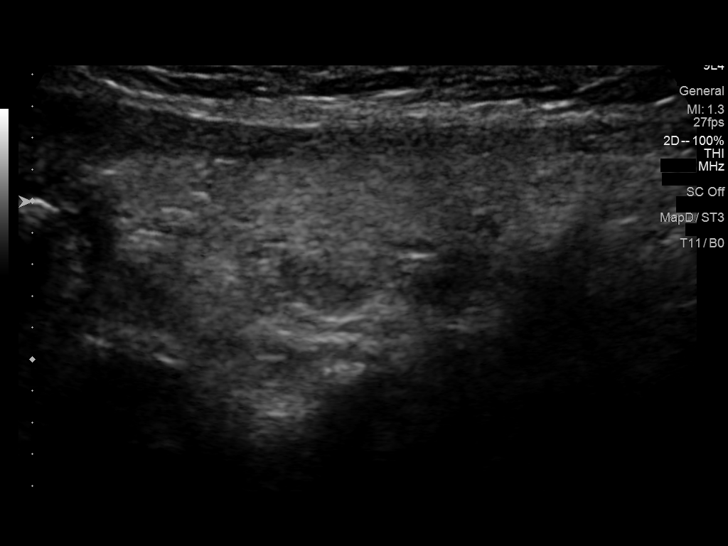
[im 17/41]
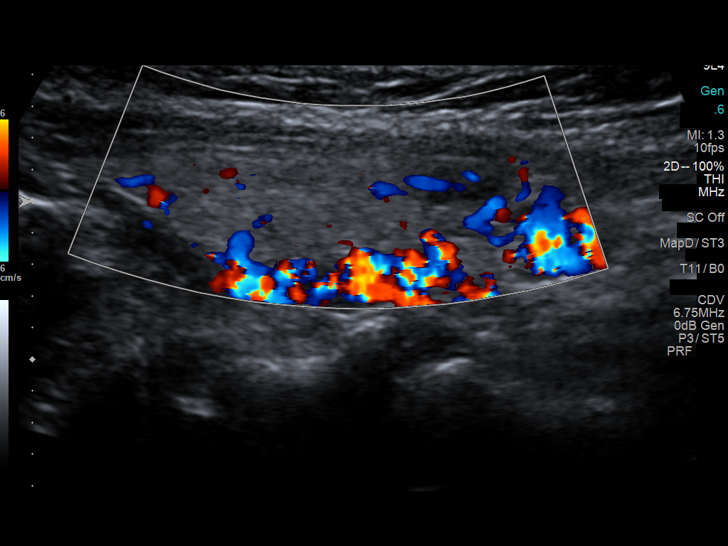
[im 21/41]
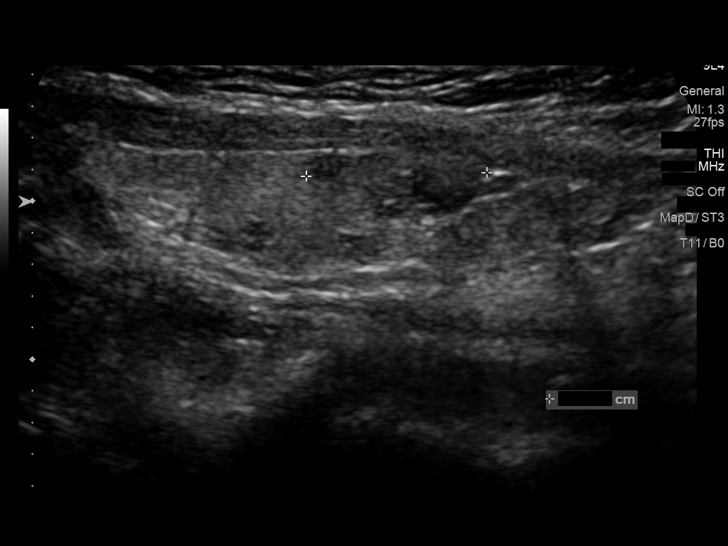
[im 24/41]
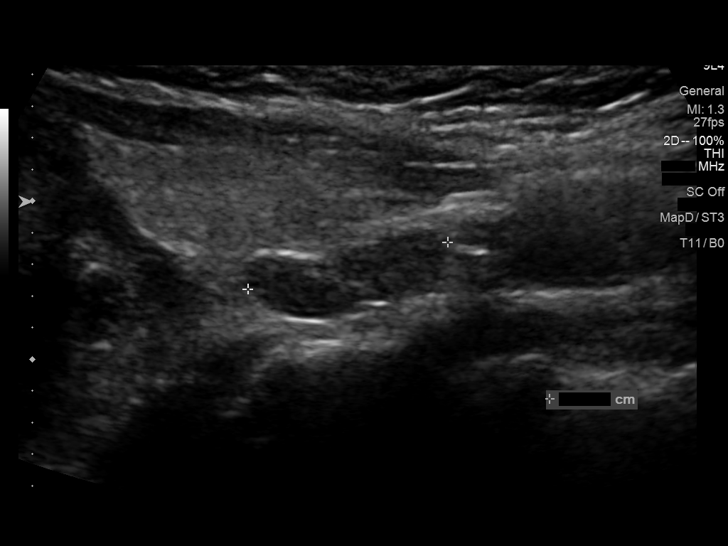
[im 27/41]
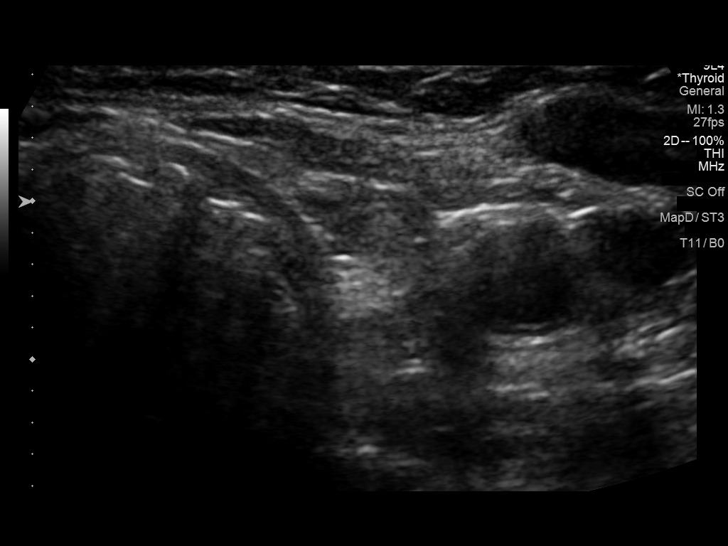
[im 31/41]
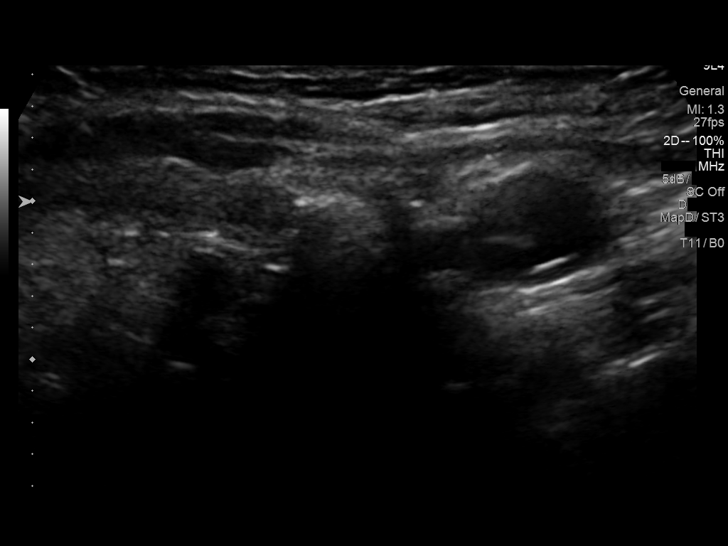
[im 34/41]
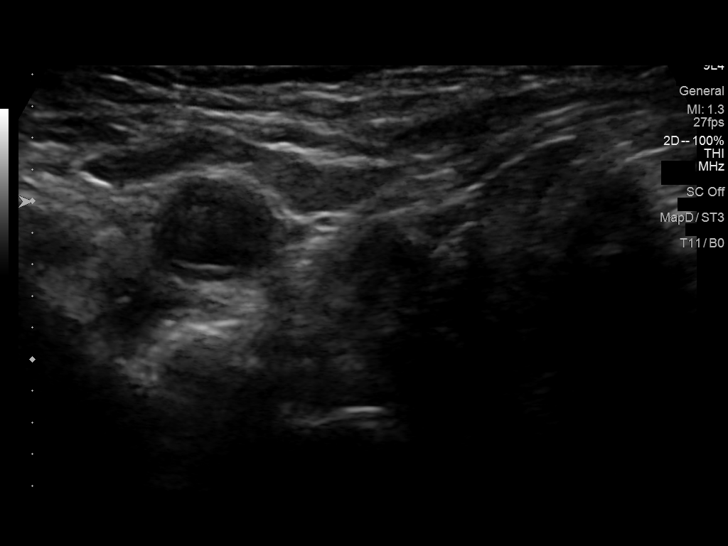
[im 37/41]
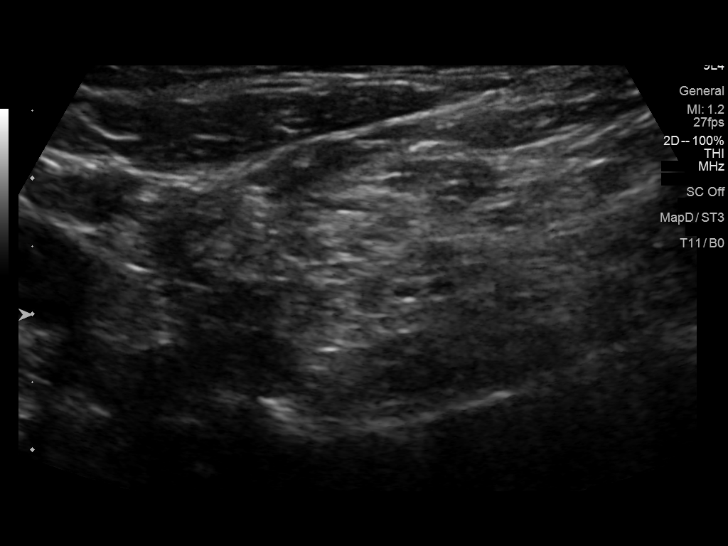
[im 41/41]
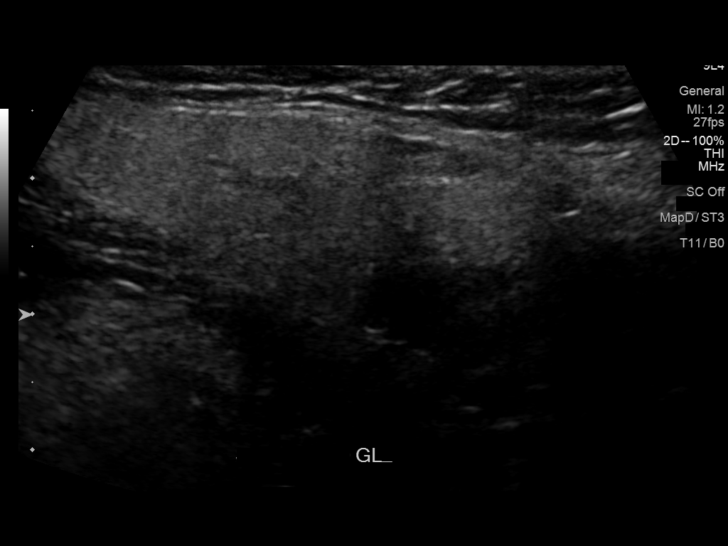

[13 of 25 positions shown; findings below may reference images not displayed]

FINDINGS: Parenchymal Echotexture: Mildly heterogeneous

Isthmus: 0.3 cm

Right lobe: 4.0 x 1.2 x 1.0 cm

Left lobe: 3.9 x 0.9 x 1.2 cm

_________________________________________________________

Estimated total number of nodules >/= 1 cm: 2

Number of spongiform nodules >/=  2 cm not described below (TR1): 0

Number of mixed cystic and solid nodules >/= 1.5 cm not described
below (TR2): 0

_________________________________________________________

Nodule 1: 0.7 x 0.7 x 0.5 cm isoechoic solid nodule in the superior
right thyroid lobe does not meet criteria for imaging surveillance
or FNA.

_________________________________________________________

Nodule # 2:

Prior biopsy: No

Location: Left; mid

Maximum size: 1.1 cm; Other 2 dimensions: 0.9 x 0.4 cm, previously,
0.8 x 0.7 x 0.3 cm on 09/03/2011

Composition: solid/almost completely solid (2)

Echogenicity: hypoechoic (2)

Shape: not taller-than-wide (0)

Margins: smooth (0)

Echogenic foci: none (0)

ACR TI-RADS total points: 4.

ACR TI-RADS risk category:  TR4 (4-6 points).

Significant change in size (>/= 20% in two dimensions and minimal
increase of 2 mm): No

Change in features: No

Change in ACR TI-RADS risk category: No

ACR TI-RADS recommendations:

Given stability since 2891, findings are consistent with a benign
nodule. No further follow-up is required.

_________________________________________________________

Nodule # 3:

Prior biopsy: No

Location: Left; inferior

Maximum size: 1.3 cm; Other 2 dimensions: 0.9 x 0.5 cm, previously,
1.4 x 1.3 x 0.6 cm on 01/12/2013.

Composition: solid/almost completely solid (2)

Echogenicity: hypoechoic (2)

Shape: not taller-than-wide (0)

Margins: smooth (0)

Echogenic foci: none (0)

ACR TI-RADS total points: 4.

ACR TI-RADS risk category:  TR4 (4-6 points).

Significant change in size (>/= 20% in two dimensions and minimal
increase of 2 mm): No

Change in features: No

Change in ACR TI-RADS risk category: No

ACR TI-RADS recommendations:

Given stability since 01/12/2013, findings are consistent with a
benign etiology.

_________________________________________________________
IMPRESSION: 1. Left thyroid nodules demonstrate greater than 5 years of
stability and are consistent with benign etiology. Do not require
further follow-up.
2. Subcentimeter right thyroid nodule does not meet criteria for
imaging follow-up.

The above is in keeping with the ACR TI-RADS recommendations - [HOSPITAL] 9689;[DATE].

## 2023-11-13 ENCOUNTER — Other Ambulatory Visit: Payer: Self-pay | Admitting: Obstetrics and Gynecology

## 2023-11-13 DIAGNOSIS — Z1231 Encounter for screening mammogram for malignant neoplasm of breast: Secondary | ICD-10-CM

## 2024-05-08 ENCOUNTER — Ambulatory Visit: Admitting: Obstetrics and Gynecology

## 2024-08-07 ENCOUNTER — Encounter: Payer: Self-pay | Admitting: Obstetrics and Gynecology

## 2024-08-07 ENCOUNTER — Other Ambulatory Visit: Payer: Self-pay | Admitting: Obstetrics and Gynecology

## 2024-08-07 ENCOUNTER — Ambulatory Visit: Admitting: Obstetrics and Gynecology

## 2024-08-07 ENCOUNTER — Other Ambulatory Visit (HOSPITAL_COMMUNITY)
Admission: RE | Admit: 2024-08-07 | Discharge: 2024-08-07 | Disposition: A | Source: Other Acute Inpatient Hospital | Attending: Obstetrics and Gynecology | Admitting: Obstetrics and Gynecology

## 2024-08-07 VITALS — BP 127/57 | HR 69 | Ht 61.3 in | Wt 187.0 lb

## 2024-08-07 DIAGNOSIS — R350 Frequency of micturition: Secondary | ICD-10-CM | POA: Diagnosis not present

## 2024-08-07 DIAGNOSIS — R82998 Other abnormal findings in urine: Secondary | ICD-10-CM

## 2024-08-07 DIAGNOSIS — N3281 Overactive bladder: Secondary | ICD-10-CM

## 2024-08-07 LAB — POCT URINALYSIS DIP (CLINITEK)
Bilirubin, UA: NEGATIVE
Blood, UA: NEGATIVE
Glucose, UA: NEGATIVE mg/dL
Ketones, POC UA: NEGATIVE mg/dL
Nitrite, UA: NEGATIVE
POC PROTEIN,UA: NEGATIVE
Spec Grav, UA: 1.02
Urobilinogen, UA: 0.2 U/dL
pH, UA: 7

## 2024-08-07 MED ORDER — VIBEGRON 75 MG PO TABS: 1.0000 | ORAL_TABLET | Freq: Every day | ORAL | 5 refills | Status: AC

## 2024-08-07 NOTE — Progress Notes (Signed)
 " New Patient Evaluation and Consultation  Referring Provider: Darcel Pool, MD PCP: Garald Karlynn GAILS, MD Date of Service: 08/07/2024  SUBJECTIVE Chief Complaint: New Patient (Initial Visit) Jennifer Jennifer [name] is a 69 y.o. Jennifer [name] is here for Overactive bladder./)  History of Present Illness: Jennifer Jennifer [name] is a 69 y.o. Jennifer Jennifer [name] seen in consultation at the request of Dr Darcel for evaluation of overactive bladder.    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 No urodynamic stress incontinence. PVR elevated at 250. Voided via detrusor contraction. No reflux on VCUG  - Previously tried Mirabegron 50mg   Urinary Symptoms: Leaks urine with going from sitting to standing, with a full bladder, with movement to the bathroom, and with urgency Leaks 6-7 time(s) per day- sometimes large amount Pad use: 5 -8 pads per day.   Patient is bothered by UI symptoms. She has not been charging her axonics device. She has not noticed any improvement in her symptoms when the device was on.  Myrbetriq did not work for her.  Has done pelvic PT  Day time voids 5-7.  Nocturia: 3-5 times per night to void. Voiding dysfunction:  empties bladder well.  Patient does not use a catheter to empty bladder.  When urinating, patient feels she has no difficulties Drinks: 1-2 cups coffee in AM, 2-3 bottles water per day, rare tea. Does not drink much before bed, just sips. Sometimes will take sips during the night.  She does not report any snoring.   UTIs: 0 UTI's in the last year.   Denies history of blood in urine and kidney or bladder stones   Pelvic Organ Prolapse Symptoms:                  Patient Denies a feeling of a bulge the vaginal area.   Bowel Symptom: Bowel movements: 3 time(s) per day Stool consistency: hard,  soft , or loose Straining: sometimes.  Splinting: no.  Incomplete evacuation: no.  Patient Denies accidental bowel leakage / fecal incontinence Bowel regimen: none  HM Colonoscopy          Completed or No Longer Recommended     Colonoscopy  Discontinued      Frequency changed to Never automatically (Topic No Longer Applies)   07/20/2019  Outside Claim: PR COLONOSCOPY FLX DX W/COLLJ SPEC WHEN PFRMD  Only the first 1 history entries have been loaded, but more history exists.               Sexual Function Sexually active: no.   Pelvic Pain Denies pelvic pain   Past Medical History:  Past Medical History:  Diagnosis Date   ADD (attention deficit disorder)    Allergy    latex   Anxiety    Complication of anesthesia    Difficulty waking up   Depression    Dyspareunia, Jennifer [name] 2006   Excessive menses 2006   H/O bladder infections    H/O folliculitis 2010   H/O mitral valve prolapse    Hx of colectomy 2/15   diverticulitis   Dr Darral   Hx: UTI (urinary tract infection) 2009   Hyperlipidemia    Hypothyroidism    IBS (irritable bowel syndrome)    Kidney infection    Knee pain    Libido, decreased 2006   Meningioma (HCC) 2011   Near optic nerve  Menopausal symptoms 2006   Optic neuritis 2009   Left   Osteoarthritis    Osteopenia    PMB (postmenopausal bleeding) 2006   Renal stones 2006   Urinary urgency 2009   Vaginal atrophy 2007   Vaginal dryness 2007   Vaginal spotting 2006   Vitamin D  deficiency      Past Surgical History:   Past Surgical History:  Procedure Laterality Date   ANTERIOR CRUCIATE LIGAMENT REPAIR     BOWEL RESECTION     KNEE SURGERY     Left for Fracture   TONSILLECTOMY     WISDOM TOOTH EXTRACTION       Past OB/GYN History: OB History  Gravida Para Term Preterm AB Living  1    1   SAB IAB Ectopic Multiple Live Births  1        # Outcome Date GA Lbr Len/2nd Weight Sex Type Anes PTL Lv  1 SAB     U    DEC     Menopausal: denies vaginal bleeding since menopause  Last pap smear was 09/2023.  Any history of abnormal pap smears: no.   Medications: Patient has a current medication list which includes the following prescription(s): adderall , ascorbic acid, vitamin d -3, clotrimazole, diclofenac sodium, epinephrine , escitalopram , estring , fish oil-omega-3 fatty acids, ibuprofen, lorazepam , multivitamin with minerals, prasterone (dhea), ondansetron , propranolol , wegovy , wegovy , thyroid , triamcinolone  cream, vibegron , ascorbic acid, vitamin e, and mirabegron er.   Allergies: Patient is allergic to aspirin, bee venom, epinephrine , latex, nitrofurantoin, alprazolam, buspar  [buspirone ], codeine, desvenlafaxine  succinate monohydrate, effexor  [venlafaxine ], ibuprofen, pregabalin, progesterone, propoxyphene, propoxyphene n-acetaminophen, sulfa antibiotics, wellbutrin  [bupropion ], zithromax  [azithromycin ], zolpidem , and sulfasalazine.   Social History: Social History[1]  Relationship status: widowed Patient lives with her pets.   Patient is not employed. Regular exercise: No History of abuse: No  Family History:   Family History  Problem Relation Age of Onset   Cancer Mother        meningioma   Heart disease Other    Colon cancer Neg Hx      Review of Systems: Review of Systems  Constitutional:  Negative for fever, malaise/fatigue and weight loss.  Respiratory:  Negative for cough, shortness of breath and wheezing.   Cardiovascular:  Negative for chest pain, palpitations and leg swelling.  Gastrointestinal:  Negative for abdominal pain and blood in stool.  Genitourinary:  Negative for dysuria.  Musculoskeletal:  Negative for myalgias.  Skin:  Negative for rash.  Neurological:  Negative for dizziness and headaches.  Endo/Heme/Allergies:  Does not bruise/bleed easily.  Psychiatric/Behavioral:  Negative for depression. The patient is not nervous/anxious.      OBJECTIVE Physical Exam: Vitals:    08/07/24 1327 08/07/24 1328  BP: (!) 122/52 (!) 127/57  Pulse: 69 69  Weight: 187 lb (84.8 kg)   Height: 5' 1.3 (1.557 m)     Physical Exam Vitals reviewed. Exam conducted with a chaperone present.  Constitutional:      General: She is not in acute distress. Pulmonary:     Effort: Pulmonary effort is normal.  Abdominal:     General: There is no distension.     Palpations: Abdomen is soft.     Tenderness: There is no abdominal tenderness. There is no rebound.  Musculoskeletal:        General: No swelling. Normal range of motion.  Skin:    General: Skin is warm and dry.     Findings: No rash.  Neurological:     Mental Status:  She is alert and oriented to person, place, and time.  Psychiatric:        Mood and Affect: Mood normal.        Behavior: Behavior normal.      GU / Detailed Urogynecologic Evaluation:  Pelvic Exam: Normal external Jennifer [name] genitalia; Bartholin's and Skene's glands normal in appearance; urethral meatus normal in appearance, no urethral masses or discharge.   CST: negative  Speculum exam reveals normal vaginal mucosa with atrophy. Cervix normal appearance. Uterus normal single, nontender. Adnexa no mass, fullness, tenderness.    Pelvic floor strength 0/V  Pelvic floor musculature: Right levator non-tender, Right obturator non-tender, Left levator non-tender, Left obturator non-tender  POP-Q:   POP-Q  -3                                            Aa   -3                                           Ba  -8                                              C   3                                            Gh  4                                            Pb  9                                            tvl   -1.5                                            Ap  -1.5                                            Bp  -9                                              D      Rectal Exam:  deferred  Post-Void Residual (PVR) by Bladder Scan: In  order to evaluate bladder emptying, we discussed obtaining a postvoid residual and patient agreed to this procedure.  Procedure: The ultrasound unit was placed on the patient's abdomen in the suprapubic region after the patient had voided.    Post  Void Residual - 08/07/24 1339       Post Void Residual   Post Void Residual 14 mL           Laboratory Results: Lab Results  Component Value Date   COLORU yellow 08/07/2024   CLARITYU clear 08/07/2024   GLUCOSEUR negative 08/07/2024   BILIRUBINUR negative 08/07/2024   SPECGRAV 1.020 08/07/2024   RBCUR negative 08/07/2024   PHUR 7.0 08/07/2024   PROTEINUR NEGATIVE 09/05/2010   UROBILINOGEN 0.2 08/07/2024   LEUKOCYTESUR Small (1+) (A) 08/07/2024    Lab Results  Component Value Date   CREATININE 0.74 10/09/2021   CREATININE 0.81 11/19/2014   CREATININE 0.80 08/21/2012    Lab Results  Component Value Date   HGBA1C 5.9 10/09/2021    Lab Results  Component Value Date   HGB 13.5 11/26/2019     ASSESSMENT AND PLAN Jennifer Jennifer [name] is a 69 y.o. with:  1. Overactive bladder   2. Urinary frequency   3. Leukocytes in urine     Overactive bladder Assessment & Plan: - Axonics rechargable SNM device placed 2024 but device currently not charged. Pt is hesitant to recharge and troubleshoot with it again since she did previously try several different settings before deciding it wasn't working.  - We discussed alternative options such as pelvic PT, medication (previously tried mirabegron) and intavesical botox.  - She is interested in pelvic PT, referral placed.  - She is maybe interested in botox and we discussed the 5-10% risk of urinary retention so she is unsure about moving forward. Would like to trial another medication first. 3 weeks of gemtesa  samples provided and Rx sent to pharmacy. She does not want a medication that may cause dry mouth.   Orders: -     Vibegron ; Take 1 tablet (75 mg total) by mouth daily.  Dispense: 30  tablet; Refill: 5 -     AMB referral to rehabilitation  Urinary frequency -     POCT URINALYSIS DIP (CLINITEK)  Leukocytes in urine -     POCT URINALYSIS DIP (CLINITEK) -     Urine Culture; Future  Follow up 1 month   Jennifer LOISE Caper, MD        [1]  Social History Tobacco Use   Smoking status: Former    Current packs/day: 0.00    Average packs/day: 1 pack/day for 8.0 years (8.0 ttl pk-yrs)    Types: Cigarettes    Start date: 07/31/1975    Quit date: 07/31/1983    Years since quitting: 41.0   Smokeless tobacco: Never  Vaping Use   Vaping status: Never Used  Substance Use Topics   Alcohol use: Yes    Alcohol/week: 7.0 standard drinks of alcohol    Types: 7 Glasses of wine per week   Drug use: No   "

## 2024-08-07 NOTE — Assessment & Plan Note (Signed)
-   Axonics rechargable SNM device placed 2024 but device currently not charged. Pt is hesitant to recharge and troubleshoot with it again since she did previously try several different settings before deciding it wasn't working.  - We discussed alternative options such as pelvic PT, medication (previously tried mirabegron) and intavesical botox.  - She is interested in pelvic PT, referral placed.  - She is maybe interested in botox and we discussed the 5-10% risk of urinary retention so she is unsure about moving forward. Would like to trial another medication first. 3 weeks of gemtesa  samples provided and Rx sent to pharmacy. She does not want a medication that may cause dry mouth.

## 2024-08-08 LAB — URINE CULTURE: Culture: NO GROWTH

## 2024-08-10 NOTE — Telephone Encounter (Signed)
 Please obtain prior auth

## 2024-09-10 ENCOUNTER — Ambulatory Visit: Admitting: Obstetrics and Gynecology
